# Patient Record
Sex: Female | Born: 1940 | Race: White | Hispanic: No | Marital: Married | Smoking: Never smoker
Health system: Southern US, Community
[De-identification: ages and names within clinical notes are randomized; demographics above are authoritative.]

## PROBLEM LIST (undated history)

## (undated) DIAGNOSIS — D649 Anemia, unspecified: Secondary | ICD-10-CM

## (undated) DIAGNOSIS — R002 Palpitations: Secondary | ICD-10-CM

## (undated) DIAGNOSIS — E782 Mixed hyperlipidemia: Secondary | ICD-10-CM

## (undated) DIAGNOSIS — Z87442 Personal history of urinary calculi: Secondary | ICD-10-CM

## (undated) DIAGNOSIS — I2510 Atherosclerotic heart disease of native coronary artery without angina pectoris: Secondary | ICD-10-CM

## (undated) DIAGNOSIS — I10 Essential (primary) hypertension: Secondary | ICD-10-CM

## (undated) DIAGNOSIS — E119 Type 2 diabetes mellitus without complications: Secondary | ICD-10-CM

## (undated) DIAGNOSIS — J300 Vasomotor rhinitis: Secondary | ICD-10-CM

## (undated) DIAGNOSIS — I739 Peripheral vascular disease, unspecified: Secondary | ICD-10-CM

## (undated) HISTORY — PX: BREAST SURGERY: SHX581

## (undated) HISTORY — DX: Palpitations: R00.2

## (undated) HISTORY — DX: Type 2 diabetes mellitus without complications: E11.9

## (undated) HISTORY — PX: CHOLECYSTECTOMY: SHX55

## (undated) HISTORY — DX: Vasomotor rhinitis: J30.0

## (undated) HISTORY — DX: Essential (primary) hypertension: I10

## (undated) HISTORY — PX: ABDOMINAL HYSTERECTOMY: SHX81

## (undated) HISTORY — DX: Mixed hyperlipidemia: E78.2

## (undated) HISTORY — DX: Anemia, unspecified: D64.9

## (undated) HISTORY — PX: APPENDECTOMY: SHX54

## (undated) HISTORY — DX: Peripheral vascular disease, unspecified: I73.9

## (undated) HISTORY — DX: Atherosclerotic heart disease of native coronary artery without angina pectoris: I25.10

---

## 2004-09-08 HISTORY — PX: CORONARY ARTERY BYPASS GRAFT: SHX141

## 2006-05-04 DIAGNOSIS — Z951 Presence of aortocoronary bypass graft: Secondary | ICD-10-CM | POA: Insufficient documentation

## 2006-05-04 DIAGNOSIS — I119 Hypertensive heart disease without heart failure: Secondary | ICD-10-CM | POA: Insufficient documentation

## 2006-05-04 DIAGNOSIS — F411 Generalized anxiety disorder: Secondary | ICD-10-CM | POA: Insufficient documentation

## 2006-05-04 DIAGNOSIS — E785 Hyperlipidemia, unspecified: Secondary | ICD-10-CM | POA: Insufficient documentation

## 2006-07-08 DIAGNOSIS — I059 Rheumatic mitral valve disease, unspecified: Secondary | ICD-10-CM | POA: Insufficient documentation

## 2009-03-27 ENCOUNTER — Encounter: Payer: Self-pay | Admitting: Cardiology

## 2009-06-22 ENCOUNTER — Encounter: Payer: Self-pay | Admitting: Cardiology

## 2009-06-23 ENCOUNTER — Encounter: Payer: Self-pay | Admitting: Cardiology

## 2009-06-25 ENCOUNTER — Encounter: Payer: Self-pay | Admitting: Cardiology

## 2009-06-25 DIAGNOSIS — E782 Mixed hyperlipidemia: Secondary | ICD-10-CM

## 2009-06-25 DIAGNOSIS — E119 Type 2 diabetes mellitus without complications: Secondary | ICD-10-CM | POA: Insufficient documentation

## 2009-06-25 DIAGNOSIS — I10 Essential (primary) hypertension: Secondary | ICD-10-CM

## 2009-06-25 DIAGNOSIS — R0602 Shortness of breath: Secondary | ICD-10-CM | POA: Insufficient documentation

## 2009-06-25 DIAGNOSIS — R072 Precordial pain: Secondary | ICD-10-CM

## 2009-06-26 ENCOUNTER — Encounter (INDEPENDENT_AMBULATORY_CARE_PROVIDER_SITE_OTHER): Payer: Self-pay | Admitting: *Deleted

## 2009-06-26 ENCOUNTER — Ambulatory Visit: Payer: Self-pay | Admitting: Cardiology

## 2009-06-26 DIAGNOSIS — R002 Palpitations: Secondary | ICD-10-CM | POA: Insufficient documentation

## 2009-06-26 DIAGNOSIS — I2510 Atherosclerotic heart disease of native coronary artery without angina pectoris: Secondary | ICD-10-CM

## 2009-06-26 DIAGNOSIS — R42 Dizziness and giddiness: Secondary | ICD-10-CM

## 2009-07-09 ENCOUNTER — Ambulatory Visit: Payer: Self-pay | Admitting: Cardiology

## 2009-07-09 ENCOUNTER — Encounter: Payer: Self-pay | Admitting: Cardiology

## 2009-07-13 ENCOUNTER — Ambulatory Visit: Payer: Self-pay | Admitting: Cardiology

## 2009-08-10 ENCOUNTER — Ambulatory Visit: Payer: Self-pay | Admitting: Cardiology

## 2012-09-20 ENCOUNTER — Encounter: Payer: Self-pay | Admitting: Cardiology

## 2012-09-20 ENCOUNTER — Other Ambulatory Visit: Payer: Self-pay | Admitting: *Deleted

## 2012-09-20 ENCOUNTER — Ambulatory Visit (INDEPENDENT_AMBULATORY_CARE_PROVIDER_SITE_OTHER): Payer: Medicare Other | Admitting: Cardiology

## 2012-09-20 VITALS — BP 178/80 | HR 54 | Ht 65.5 in | Wt 185.0 lb

## 2012-09-20 DIAGNOSIS — I359 Nonrheumatic aortic valve disorder, unspecified: Secondary | ICD-10-CM

## 2012-09-20 DIAGNOSIS — I351 Nonrheumatic aortic (valve) insufficiency: Secondary | ICD-10-CM

## 2012-09-20 DIAGNOSIS — R0600 Dyspnea, unspecified: Secondary | ICD-10-CM

## 2012-09-20 DIAGNOSIS — R0989 Other specified symptoms and signs involving the circulatory and respiratory systems: Secondary | ICD-10-CM

## 2012-09-20 DIAGNOSIS — R002 Palpitations: Secondary | ICD-10-CM

## 2012-09-20 DIAGNOSIS — I10 Essential (primary) hypertension: Secondary | ICD-10-CM

## 2012-09-20 DIAGNOSIS — I2510 Atherosclerotic heart disease of native coronary artery without angina pectoris: Secondary | ICD-10-CM

## 2012-09-20 DIAGNOSIS — E782 Mixed hyperlipidemia: Secondary | ICD-10-CM

## 2012-09-20 NOTE — Assessment & Plan Note (Signed)
Etiology not certain, heart rate is well controlled today. We have requested most recent ECG from [facility] internal medicine. She has never the cardiac monitor as part of her evaluations, and therefore a 7 day event recorder will be obtained. She reports symptoms usually once or twice a week. We will inform her of the results.

## 2012-09-20 NOTE — Patient Instructions (Addendum)
Your physician recommends that you continue on your current medications as directed. Please refer to the Current Medication list given to you today.  Your physician has recommended that you wear an 7 day cardiac event monitor. Event monitors are medical devices that record the heart's electrical activity. Doctors most often [place] these monitors to diagnose arrhythmias. Arrhythmias are problems with the speed or rhythm of the heartbeat. The monitor is a small, portable device. You can wear one while you do your normal daily activities. This is usually used to diagnose what is causing palpitations/syncope (passing out). [employer] will contact about this monitor. We will call you with results.

## 2012-09-20 NOTE — Assessment & Plan Note (Signed)
History based on limited information, previously PCIs by Dr. Georganna Skeans, subsequently CABG of a single vessel in [place] in 2007. Will request most recent cardiac catheterization results from Dr. Adella Hare at [facility] in 2012. She is not describing angina and, is on reasonable medical therapy. The fact that her symptoms described have not changed at all since revascularization surgery would argue that perhaps another noncardiac etiology should be considered.

## 2012-09-20 NOTE — Progress Notes (Signed)
Clinical Summary Kimberly Flowers is a 72 y.o.female referred back to the office by Dr. Sherril Croon. She is a patient of Dr. Myrtis Ser, last seen in 2010 history outlined below. I do not have complete details of her cardiac history, however she has had reassuring evaluations over time, including both noninvasive and invasive testing. She states that she has a chronic history of intermittent palpitations, describing a rapid heartbeat, not predictable and sometimes very prolonged. She also feels overly fatigued and short of breath when she tries to go up steps or go up inclines, sometimes even by just holding her arms up over her head. Otherwise when she is walking on level ground she feels well, can walk 2 miles at the track at the [facility]. Since she saw Dr. Myrtis Ser, she states she had an evaluation at [facility] with Dr. Adella Hare in 2012, at which time a repeat cardiac catheterization "looked good."  Recent echocardiogram done in [facility] internal medicine on 1/6 reports LVEF 50-55% with mild LVH, trace mitral regurgitation, mildly thickened aortic leaflets with mild aortic regurgitation, mild tricuspid regurgitation, normal pericardium. Recent lab work showed hemoglobin 14.9, platelets 203, BUN 15, creatinine 0.8, potassium 4.5, AST 11, ALT 9, TSH 1.6.  Dobutamine echocardiogram in November 2010 showed noted diagnostic ST segment changes or stress-induced wall motion abnormalities to indicate ischemia. Surface echocardiogram at that time also showed mild aortic regurgitation.  Today we discussed her symptoms in detail. She seems very frustrated by them, was tearful during the interview. She states that Dr. Adella Hare thought that she might have "fibromyalgia." I reviewed her testing results, and have requested the cardiac catheterization report from 2012 as well as her most recent ECG.  She states that she has never worn a cardiac monitor as part of her evaluation.   Allergies  Allergen Reactions  . Sulfonamide Derivatives    REACTION: Unknown    Current Outpatient Prescriptions  Medication Sig Dispense Refill  . aspirin 81 MG tablet Take 81 mg by mouth daily.      Marland Kitchen atorvastatin (LIPITOR) 10 MG tablet Take 10 mg by mouth daily.      . clopidogrel (PLAVIX) 75 MG tablet Take 75 mg by mouth daily.      Marland Kitchen Fe Cbn-Fe Gluc-FA-B12-C-DSS (FERRALET 90 PO) Take 90 mg by mouth 2 (two) times daily.      Marland Kitchen losartan (COZAAR) 50 MG tablet Take 25 mg by mouth 2 (two) times daily. Take 1/2 tab bid      . metformin (FORTAMET) 500 MG (OSM) 24 hr tablet Take 500 mg by mouth 2 (two) times daily with a meal.       . nebivolol (BYSTOLIC) 10 MG tablet Take 10 mg by mouth daily.      Marland Kitchen atenolol (TENORMIN) 25 MG tablet Take 25 mg by mouth 2 (two) times daily.        Past Medical History  Diagnosis Date  . Coronary atherosclerosis of native coronary artery     Previous PCI 2007-2008, Dr. Georganna Skeans  . Mixed hyperlipidemia   . Type 2 diabetes mellitus   . Essential hypertension, benign   . Anemia   . PAD (peripheral artery disease)      Absent right DP  . Vasomotor rhinitis   . Palpitations     Past Surgical History  Procedure Date  . Coronary artery bypass graft 2006    [name], single vessel  . Abdominal hysterectomy     Family History  Problem Relation Age of Onset  .  Cancer Sister     Breast  . CAD      Family history    Social History [name] reports that she has never smoked. She does not have any smokeless tobacco history on file. [name] reports that she does not drink alcohol.  Review of Systems No fevers or chills, no cough, no bleeding problems. No syncope. Does have dependent leg edema, resolved when she gets up in the mornings. Otherwise negative.  Physical Examination Filed Vitals:   09/20/12 1304  BP: 178/80  Pulse: 54   Filed Weights   09/20/12 1304  Weight: 185 lb (83.915 kg)   No acute distress. HEENT: Conjunctiva and lids normal, oropharynx clear. Neck: Supple, no elevated JVP or  carotid bruits, no thyromegaly. Lungs: Clear to auscultation, nonlabored breathing at rest. Cardiac: Regular rate and rhythm, no S3 or significant systolic murmur, no diastolic murmur, no pericardial rub. Abdomen: Soft, nontender, bowel sounds present, no guarding or rebound. Extremities: Trace ankle edema, distal pulses 2+. Skin: Warm and dry. Musculoskeletal: No kyphosis. Neuropsychiatric: Alert and oriented x3, affect grossly appropriate.   Problem List and Plan   Palpitations Etiology not certain, heart rate is well controlled today. We have requested most recent ECG from [facility] internal medicine. She has never the cardiac monitor as part of her evaluations, and therefore a 7 day event recorder will be obtained. She reports symptoms usually once or twice a week. We will inform her of the results.  Coronary atherosclerosis of native coronary artery History based on limited information, previously PCIs by Dr. Georganna Skeans, subsequently CABG of a single vessel in [place] in 2007. Will request most recent cardiac catheterization results from Dr. Adella Hare at [facility] in 2012. She is not describing angina and, is on reasonable medical therapy. The fact that her symptoms described have not changed at all since revascularization surgery would argue that perhaps another noncardiac etiology should be considered.  Essential hypertension, benign Blood-pressure elevated today. I asked about medication doses, and she states that on higher antihypertensive doses, she gets dizzy when she stands up. Keep regular followup with primary care provider.  Mixed hyperlipidemia On statin therapy. Would aim for LDL control under 100.  Aortic regurgitation Mild, stable by recent echocardiogram. This should not be symptom provoking.    Jonelle Sidle, M.D., F.A.C.C.

## 2012-09-20 NOTE — Assessment & Plan Note (Signed)
On statin therapy. Would aim for LDL control under 100.

## 2012-09-20 NOTE — Assessment & Plan Note (Signed)
Blood-pressure elevated today. I asked about medication doses, and she states that on higher antihypertensive doses, she gets dizzy when she stands up. Keep regular followup with primary care provider.

## 2012-09-20 NOTE — Assessment & Plan Note (Signed)
Mild, stable by recent echocardiogram. This should not be symptom provoking.

## 2012-09-20 NOTE — Addendum Note (Signed)
Addended by: Eustace Moore on: 09/20/2012 01:49 PM   Modules accepted: Orders

## 2012-09-22 DIAGNOSIS — R002 Palpitations: Secondary | ICD-10-CM

## 2014-12-05 ENCOUNTER — Encounter: Payer: Self-pay | Admitting: Cardiovascular Disease

## 2014-12-05 ENCOUNTER — Ambulatory Visit (INDEPENDENT_AMBULATORY_CARE_PROVIDER_SITE_OTHER): Payer: Medicare Other | Admitting: Cardiovascular Disease

## 2014-12-05 VITALS — BP 139/82 | HR 52 | Ht 65.5 in | Wt 194.0 lb

## 2014-12-05 DIAGNOSIS — R001 Bradycardia, unspecified: Secondary | ICD-10-CM

## 2014-12-05 DIAGNOSIS — E785 Hyperlipidemia, unspecified: Secondary | ICD-10-CM

## 2014-12-05 DIAGNOSIS — I2510 Atherosclerotic heart disease of native coronary artery without angina pectoris: Secondary | ICD-10-CM

## 2014-12-05 DIAGNOSIS — E119 Type 2 diabetes mellitus without complications: Secondary | ICD-10-CM

## 2014-12-05 DIAGNOSIS — I25768 Atherosclerosis of bypass graft of coronary artery of transplanted heart with other forms of angina pectoris: Secondary | ICD-10-CM | POA: Diagnosis not present

## 2014-12-05 DIAGNOSIS — R0609 Other forms of dyspnea: Secondary | ICD-10-CM

## 2014-12-05 DIAGNOSIS — I10 Essential (primary) hypertension: Secondary | ICD-10-CM | POA: Diagnosis not present

## 2014-12-05 DIAGNOSIS — R5383 Other fatigue: Secondary | ICD-10-CM

## 2014-12-05 NOTE — Progress Notes (Addendum)
Patient ID: Kimberly Flowers, female   DOB: 1941/06/20, 74 y.o.   MRN: [id number]      SUBJECTIVE: The patient is a 74 year old female with a history of coronary artery disease and CABG. As per medical records from 01/06/2011 and she was evaluated by Dr. Metta Clines at [facility], she underwent coronary angiography and reportedly has a known occlusion of the LIMA graft to the LAD. Cardiac catheterization at that time revealed diffuse mild nonobstructive disease and a widely patent stent in the LAD. Her ejection fraction was reportedly 55% with normal regional wall motion. She also has a history of essential hypertension, hyperlipidemia, and type 2 diabetes mellitus.  CABG was performed in 2006 by Dr. Roselie Awkward in [place], [place]. She said that her bypass graft didn't take and she ultimately required 2 stents. She denies a history of asthma, but says she has been short of breath and cannot climb stairs as she becomes fatigued after trying to do so. This started occurring prior to her bypass surgery and never improved. She is tearful when talking about it. She tries to walk 3-4 times per week. Prior to 2006, she said she used to walk 4 miles daily. She sometimes walks 1-2 miles at the [facility] and then has to rest for half a day. She very seldom experiences dizziness and denies syncope altogether. She says her HbA1c was most recently 6% but no recent blood work is available for my review. She is not certain if she has ever had pulmonary function testing. She said she has "low iron counts" but these have improved recently and takes ferrous gluconate 324 mg daily. She also says that if she raises her arms above her head for prolonged periods of time she feels fatigued. There appears to be a history of rheumatoid arthritis amongst sisters but she denies a personal history of this.  She seldom experiences chest pain relieved with one sublingual nitroglycerin. She sleeps with 2-3 pillows for comfort.  ECG performed  in the office today demonstrates sinus bradycardia with T-wave inversions in leads V1 and V2 and a nonspecific T wave abnormality in aVL and V3 through V5. Mild left axis deviation is also present.  Soc: She is married and has grown adult children. She lives in [place]. Her sister, Margie Ege, is also my patient.    Review of Systems: As per "subjective", otherwise negative.  Allergies  Allergen Reactions  . Sulfonamide Derivatives     REACTION: Unknown    Current Outpatient Prescriptions  Medication Sig Dispense Refill  . amLODipine (NORVASC) 5 MG tablet Take 5 mg by mouth daily.    . Ascorbic Acid (VITAMIN C) 1000 MG tablet Take 1,000 mg by mouth 2 (two) times daily.    Marland Kitchen aspirin 81 MG EC tablet Take 81 mg by mouth daily.    Marland Kitchen atorvastatin (LIPITOR) 10 MG tablet Take 10 mg by mouth daily.    . Calcium Carbonate-Vitamin D 600-400 MG-UNIT per tablet Take 1 tablet by mouth 2 (two) times daily.    . Cholecalciferol (VITAMIN D3) 5000 UNITS CAPS Take 1 capsule by mouth daily.    . clopidogrel (PLAVIX) 75 MG tablet Take 75 mg by mouth daily.    Marland Kitchen docusate sodium (COLACE) 100 MG capsule Take 200 mg by mouth daily.    . ferrous gluconate (FERGON) 324 MG tablet Take 324 mg by mouth daily.    . fexofenadine (ALLEGRA) 180 MG tablet Take 180 mg by mouth daily.    Marland Kitchen glucosamine-chondroitin  500-400 MG tablet Take 12 tablets by mouth daily.    Marland Kitchen losartan (COZAAR) 25 MG tablet Take 25 mg by mouth 2 (two) times daily.    . metformin (FORTAMET) 500 MG (OSM) 24 hr tablet Take 500 mg by mouth daily with breakfast.     . nebivolol (BYSTOLIC) 10 MG tablet Take 10 mg by mouth daily.    . nitroGLYCERIN (NITROSTAT) 0.4 MG SL tablet Place 0.4 mg under the tongue every 5 (five) minutes x 3 doses as needed.     . Omega-3 Fatty Acids (FISH OIL) 1200 MG CAPS Take 1 capsule by mouth daily.    Marland Kitchen omeprazole (PRILOSEC) 40 MG capsule Take 40 mg by mouth daily.     No current facility-administered  medications for this visit.    Past Medical History  Diagnosis Date  . Coronary atherosclerosis of native coronary artery     Previous PCI 2007-2008, Dr. Sharyon Cable  . Mixed hyperlipidemia   . Type 2 diabetes mellitus   . Essential hypertension, benign   . Anemia   . PAD (peripheral artery disease)      Absent right DP  . Vasomotor rhinitis   . Palpitations     Past Surgical History  Procedure Laterality Date  . Coronary artery bypass graft  2006    [name], single vessel  . Abdominal hysterectomy      History   Social History  . Marital Status: Married    Spouse Name: N/A  . Number of Children: N/A  . Years of Education: N/A   Occupational History  . Not on file.   Social History Main Topics  . Smoking status: Never Smoker   . Smokeless tobacco: Never Used  . Alcohol Use: No  . Drug Use: No  . Sexual Activity: Not on file   Other Topics Concern  . Not on file   Social History Narrative     Filed Vitals:   12/05/14 0926  BP: 139/82  Pulse: 52  Height: 5' 5.5" (1.664 m)  Weight: 194 lb (87.998 kg)    PHYSICAL EXAM General: NAD HEENT: Normal. Neck: No JVD, no thyromegaly. Lungs: Clear to auscultation bilaterally with normal respiratory effort. CV: Nondisplaced PMI.  Regular rate and rhythm, normal S1/S2, no S3/S4, no murmur. No pretibial or periankle edema.  No carotid bruit.  Normal pedal pulses.  Abdomen: Soft, nontender, no hepatosplenomegaly, no distention.  Neurologic: Alert and oriented x 3.  Psych: Normal affect. Skin: Normal. Musculoskeletal: Normal range of motion, no gross deformities. Extremities: No clubbing or cyanosis.   ECG: Most recent ECG reviewed.      ASSESSMENT AND PLAN: 1. CAD with CABG and occluded LIMA graft to LAD: Stable ischemic heart disease. Will continue ASA, Lipitor, Plavix, and Bystolic. No indication for noninvasive testing at this time. 2. Essential HTN: She said these have only recently been under better  control. Will continue losartan 25 mg twice daily and Bystolic 10 mg daily, along with amlodipine 5 mg daily. 3. Hyperlipidemia: Will obtain copy of lipid panel from PCP. Continue low-intensity statin therapy for now with Lipitor 10 mg. 4. Type 2 diabetes: Appears to be well controlled with metformin 500 mg daily. No changes. 5. Exertional dyspnea while climbing stairs: Symptoms did not appear to improve after bypass surgery and stenting. Left ventricular systolic function was reportedly normal in 2012. It is possible that previously uncontrolled essential hypertension may have led to increased left ventricular end-diastolic filling pressures leading to exertional dyspnea, but I  cannot be certain. Her history of low iron counts may also have contributed to this. I will obtain records from her primary care provider with respect to iron panels and CBC. 6. Fatigue with arm raising: No personal history of autoimmune disease, but there appears to be a family history of this. I will see if an erythrocyte sedimentation rate has ever been checked. 7. Bradycardia: I wonder if this has led to her symptoms of fatigue. However, her symptoms may have predated the institution of this. I will monitor for now. If this is a culprit, I would reduce the dose of nebivolol and increase losartan or amlodipine for BP control.  Dispo: f/u 1 year.  Time spent: 40 minutes, of which greater than 50% was spent reviewing symptoms, relevant studies, and discussing management plan with the patient.   Kate Sable, M.D., F.A.C.C.

## 2014-12-05 NOTE — Patient Instructions (Signed)
Continue all current medications. Your physician wants you to follow up in:  1 year.  You will receive a reminder letter in the mail one-two months in advance.  If you don't receive a letter, please call our office to schedule the follow up appointment   

## 2014-12-11 ENCOUNTER — Encounter: Payer: Self-pay | Admitting: *Deleted

## 2015-08-16 ENCOUNTER — Encounter (HOSPITAL_COMMUNITY): Payer: Self-pay | Admitting: *Deleted

## 2015-08-16 ENCOUNTER — Inpatient Hospital Stay (HOSPITAL_COMMUNITY)
Admission: AD | Admit: 2015-08-16 | Discharge: 2015-08-18 | DRG: 287 | Disposition: A | Discharge status: Home / Self Care | Payer: Medicare Other | Source: Other Acute Inpatient Hospital | Attending: Cardiovascular Disease | Admitting: Cardiovascular Disease

## 2015-08-16 DIAGNOSIS — E119 Type 2 diabetes mellitus without complications: Secondary | ICD-10-CM | POA: Diagnosis present

## 2015-08-16 DIAGNOSIS — I200 Unstable angina: Secondary | ICD-10-CM

## 2015-08-16 DIAGNOSIS — E782 Mixed hyperlipidemia: Secondary | ICD-10-CM | POA: Diagnosis present

## 2015-08-16 DIAGNOSIS — Z8249 Family history of ischemic heart disease and other diseases of the circulatory system: Secondary | ICD-10-CM | POA: Diagnosis not present

## 2015-08-16 DIAGNOSIS — I358 Other nonrheumatic aortic valve disorders: Secondary | ICD-10-CM | POA: Diagnosis present

## 2015-08-16 DIAGNOSIS — Z7984 Long term (current) use of oral hypoglycemic drugs: Secondary | ICD-10-CM

## 2015-08-16 DIAGNOSIS — I739 Peripheral vascular disease, unspecified: Secondary | ICD-10-CM | POA: Diagnosis present

## 2015-08-16 DIAGNOSIS — I10 Essential (primary) hypertension: Secondary | ICD-10-CM | POA: Diagnosis present

## 2015-08-16 DIAGNOSIS — R079 Chest pain, unspecified: Secondary | ICD-10-CM | POA: Diagnosis not present

## 2015-08-16 DIAGNOSIS — I25119 Atherosclerotic heart disease of native coronary artery with unspecified angina pectoris: Secondary | ICD-10-CM | POA: Diagnosis not present

## 2015-08-16 DIAGNOSIS — Z951 Presence of aortocoronary bypass graft: Secondary | ICD-10-CM

## 2015-08-16 DIAGNOSIS — Z9861 Coronary angioplasty status: Secondary | ICD-10-CM | POA: Diagnosis not present

## 2015-08-16 DIAGNOSIS — Z7982 Long term (current) use of aspirin: Secondary | ICD-10-CM

## 2015-08-16 DIAGNOSIS — I25110 Atherosclerotic heart disease of native coronary artery with unstable angina pectoris: Principal | ICD-10-CM | POA: Diagnosis present

## 2015-08-16 DIAGNOSIS — Z809 Family history of malignant neoplasm, unspecified: Secondary | ICD-10-CM | POA: Diagnosis not present

## 2015-08-16 DIAGNOSIS — K219 Gastro-esophageal reflux disease without esophagitis: Secondary | ICD-10-CM | POA: Diagnosis present

## 2015-08-16 DIAGNOSIS — I2510 Atherosclerotic heart disease of native coronary artery without angina pectoris: Secondary | ICD-10-CM | POA: Diagnosis present

## 2015-08-16 MED ORDER — LORATADINE 10 MG PO TABS
10.0000 mg | ORAL_TABLET | Freq: Every day | ORAL | Status: DC
  Administered 2015-08-17 – 2015-08-18 (×2): 10 mg via ORAL
  Filled 2015-08-16 (×2): qty 1

## 2015-08-16 MED ORDER — HEPARIN (PORCINE) IN NACL 100-0.45 UNIT/ML-% IJ SOLN: 1200.0000 [IU]/h | INTRAMUSCULAR | Status: DC

## 2015-08-16 MED ORDER — ACETAMINOPHEN 325 MG PO TABS
650.0000 mg | ORAL_TABLET | ORAL | Status: DC | PRN
  Administered 2015-08-16 – 2015-08-18 (×2): 650 mg via ORAL
  Filled 2015-08-16 (×2): qty 2

## 2015-08-16 MED ORDER — ONDANSETRON HCL 4 MG/2ML IJ SOLN: 4.0000 mg | Freq: Four times a day (QID) | INTRAMUSCULAR | Status: DC | PRN

## 2015-08-16 MED ORDER — CLOPIDOGREL BISULFATE 75 MG PO TABS
75.0000 mg | ORAL_TABLET | Freq: Every day | ORAL | Status: DC
  Administered 2015-08-17 – 2015-08-18 (×2): 75 mg via ORAL
  Filled 2015-08-16 (×2): qty 1

## 2015-08-16 MED ORDER — FERROUS GLUCONATE 324 (38 FE) MG PO TABS
324.0000 mg | ORAL_TABLET | Freq: Every day | ORAL | Status: DC
  Administered 2015-08-16 – 2015-08-17 (×2): 324 mg via ORAL
  Filled 2015-08-16 (×4): qty 1

## 2015-08-16 MED ORDER — ASPIRIN EC 81 MG PO TBEC
81.0000 mg | DELAYED_RELEASE_TABLET | Freq: Every day | ORAL | Status: DC
  Administered 2015-08-17 – 2015-08-18 (×2): 81 mg via ORAL
  Filled 2015-08-16 (×2): qty 1

## 2015-08-16 MED ORDER — OMEGA-3-ACID ETHYL ESTERS 1 G PO CAPS
1.0000 g | ORAL_CAPSULE | Freq: Two times a day (BID) | ORAL | Status: DC
  Administered 2015-08-16 – 2015-08-18 (×4): 1 g via ORAL
  Filled 2015-08-16 (×4): qty 1

## 2015-08-16 MED ORDER — CALCIUM CARBONATE-VITAMIN D 500-200 MG-UNIT PO TABS
1.0000 | ORAL_TABLET | Freq: Two times a day (BID) | ORAL | Status: DC
  Administered 2015-08-16 – 2015-08-18 (×4): 1 via ORAL
  Filled 2015-08-16 (×4): qty 1

## 2015-08-16 MED ORDER — ATORVASTATIN CALCIUM 80 MG PO TABS
80.0000 mg | ORAL_TABLET | Freq: Every day | ORAL | Status: DC
  Administered 2015-08-16 – 2015-08-17 (×2): 80 mg via ORAL
  Filled 2015-08-16 (×2): qty 1

## 2015-08-16 MED ORDER — NITROGLYCERIN 0.4 MG SL SUBL: 0.4000 mg | SUBLINGUAL_TABLET | SUBLINGUAL | Status: DC | PRN

## 2015-08-16 MED ORDER — VITAMIN D3 25 MCG (1000 UNIT) PO TABS
5000.0000 [IU] | ORAL_TABLET | Freq: Every day | ORAL | Status: DC
  Administered 2015-08-17 – 2015-08-18 (×2): 5000 [IU] via ORAL
  Filled 2015-08-16 (×4): qty 5

## 2015-08-16 MED ORDER — AMLODIPINE BESYLATE 5 MG PO TABS
5.0000 mg | ORAL_TABLET | Freq: Every day | ORAL | Status: DC
  Administered 2015-08-16 – 2015-08-17 (×2): 5 mg via ORAL
  Filled 2015-08-16 (×2): qty 1

## 2015-08-16 MED ORDER — LOSARTAN POTASSIUM 25 MG PO TABS
25.0000 mg | ORAL_TABLET | Freq: Two times a day (BID) | ORAL | Status: DC
  Administered 2015-08-16 – 2015-08-18 (×4): 25 mg via ORAL
  Filled 2015-08-16 (×4): qty 1

## 2015-08-16 MED ORDER — PANTOPRAZOLE SODIUM 40 MG PO TBEC
40.0000 mg | DELAYED_RELEASE_TABLET | Freq: Every day | ORAL | Status: DC
Start: 2015-08-17 — End: 2015-08-18
  Administered 2015-08-17 – 2015-08-18 (×2): 40 mg via ORAL
  Filled 2015-08-16 (×2): qty 1

## 2015-08-16 MED ORDER — SUCRALFATE 1 G PO TABS
0.5000 g | ORAL_TABLET | Freq: Three times a day (TID) | ORAL | Status: DC
  Administered 2015-08-16 – 2015-08-18 (×4): 0.5 g via ORAL
  Filled 2015-08-16 (×4): qty 1

## 2015-08-16 MED ORDER — METOPROLOL TARTRATE 12.5 MG HALF TABLET
12.5000 mg | ORAL_TABLET | Freq: Two times a day (BID) | ORAL | Status: DC
  Administered 2015-08-16 – 2015-08-18 (×4): 12.5 mg via ORAL
  Filled 2015-08-16 (×4): qty 1

## 2015-08-16 MED ORDER — VITAMIN C 500 MG PO TABS
1000.0000 mg | ORAL_TABLET | Freq: Two times a day (BID) | ORAL | Status: DC
  Administered 2015-08-16 – 2015-08-18 (×4): 1000 mg via ORAL
  Filled 2015-08-16 (×4): qty 2

## 2015-08-16 MED ORDER — DOCUSATE SODIUM 100 MG PO CAPS: 100.0000 mg | ORAL_CAPSULE | Freq: Every day | ORAL | Status: DC | PRN

## 2015-08-16 NOTE — H&P (Addendum)
Kimberly Flowers is an 74 y.o. female.    Chief Complaint: chest pain Primary Cardiologist: Dr. McDowell/Koneswaran HPI: Kimberly Flowers is a 74 yo woman with PMH of T2DM, hypertension, dyslipidemia, CAD s/p CABG 2006 by Dr. Roselie Awkward in [place], [place] with some records from 12/2010 discussing LHC by Dr. Metta Clines at [facility] performing coronary angiography with known occluded LIMA to LAD and diffuse mild nonobstructive CAD remainder of LAD, EF of 55%. She was seen by Dr. Bronson Ing March 2016 and previously by dr. Domenic Polite and Dr. Ron Parker. She worked in a Clinical cytogeneticist for 17 years and is accompanied by her husband, sister and brother tonight. She went to see her physician this AM at [facility] and then referred to have mild swelling of her left leg evaluated for DVT (negative) when she developed chest pain a little after Noon today. She characterized the pain as sharp, substernal with radiation to her neck. She took asa and NTG spray with mild improvement. She was moved to the ER and pain gradually went away. Labs unrevealing, heparin bolus and gtt started and transfer initiated to [facility].   She typically has stable angina with CP ~ 0-3x a week that resolves with NTG. However, over the past 4-6 weeks she's had CP more frequently probably 4x a week. She typically walks 2 miles several times a week without difficult but last week she was very fatigued after 0.5 mile. Currently she denies infectious symptoms, no diarrhea/fever/chills. No syncope, no PND/orthopnea. No significant weight loss or gain. She is tolerant of her medications - takes asa/plavix for years. She does have frequent EGD/Colonoscopy for possible precancerous lesion noted and follow-up surveillance. She says she has no dark stools but does test hemacullt positive.   Past Medical History  Diagnosis Date  . Coronary atherosclerosis of native coronary artery     Previous PCI 2007-2008, Dr. Sharyon Cable  . Mixed hyperlipidemia   . Type 2 diabetes mellitus  ([place])   . Essential hypertension, benign   . Anemia   . PAD (peripheral artery disease) (HCC)      Absent right DP  . Vasomotor rhinitis   . Palpitations     Past Surgical History  Procedure Laterality Date  . Coronary artery bypass graft  2006    [name], single vessel  . Abdominal hysterectomy      Family History  Problem Relation Age of Onset  . Cancer Sister     Breast  . CAD      Family history   Social History:  reports that she has never smoked. She has never used smokeless tobacco. She reports that she does not drink alcohol or use illicit drugs.  Allergies:  Allergies  Allergen Reactions  . Sulfonamide Derivatives     REACTION: Unknown    Medications Prior to Admission  Medication Sig Dispense Refill  . amLODipine (NORVASC) 5 MG tablet Take 5 mg by mouth at bedtime.     . Ascorbic Acid (VITAMIN C) 1000 MG tablet Take 1,000 mg by mouth 2 (two) times daily.    Marland Kitchen aspirin 81 MG EC tablet Take 81 mg by mouth daily.    Marland Kitchen atorvastatin (LIPITOR) 10 MG tablet Take 10 mg by mouth daily at 6 PM.     . Calcium Carbonate-Vitamin D 600-400 MG-UNIT per tablet Take 1 tablet by mouth 2 (two) times daily.    . Cholecalciferol (VITAMIN D3) 5000 UNITS CAPS Take 5,000 Units by mouth daily.     . clopidogrel (PLAVIX)  75 MG tablet Take 75 mg by mouth daily.    Marland Kitchen docusate sodium (COLACE) 100 MG capsule Take 100 mg by mouth daily as needed for mild constipation or moderate constipation.     . ferrous gluconate (FERGON) 324 MG tablet Take 324 mg by mouth at bedtime.     . fexofenadine (ALLEGRA) 180 MG tablet Take 180 mg by mouth daily.    Marland Kitchen glucosamine-chondroitin 500-400 MG tablet Take 1 tablet by mouth 2 (two) times daily.     Marland Kitchen losartan (COZAAR) 25 MG tablet Take 25 mg by mouth 2 (two) times daily.    . metformin (FORTAMET) 500 MG (OSM) 24 hr tablet Take 500 mg by mouth at bedtime.     . nebivolol (BYSTOLIC) 10 MG tablet Take 10 mg by mouth daily.    . nitroGLYCERIN (NITROLINGUAL)  0.4 MG/SPRAY spray Place 1 spray under the tongue every 5 (five) minutes x 3 doses as needed for chest pain.    . Omega-3 Fatty Acids (FISH OIL) 1200 MG CAPS Take 1,200 mg by mouth at bedtime.     Marland Kitchen omeprazole (PRILOSEC) 40 MG capsule Take 40 mg by mouth daily.    . sucralfate (CARAFATE) 1 G tablet Take 0.5 g by mouth 4 (four) times daily -  with meals and at bedtime.      No results found for this or any previous visit (from the past 48 hour(s)). No results found.  Review of Systems  Constitutional: Negative for fever, chills and malaise/fatigue.  HENT: Negative for ear pain and tinnitus.   Eyes: Negative for double vision, photophobia and pain.  Respiratory: Negative for cough and hemoptysis.        Dyspnea with exertion  Cardiovascular: Positive for chest pain and leg swelling. Negative for orthopnea.       Slight left leg swelling  Gastrointestinal: Negative for vomiting, abdominal pain and diarrhea.  Genitourinary: Negative for dysuria, frequency and hematuria.  Musculoskeletal: Negative for myalgias, back pain and neck pain.  Skin: Negative for rash.  Neurological: Negative for dizziness, tingling, tremors, sensory change and headaches.  Endo/Heme/Allergies: Negative for polydipsia. Does not bruise/bleed easily.  Psychiatric/Behavioral: Negative for depression, suicidal ideas and substance abuse.    Blood pressure 147/74, pulse 56, temperature 98.2 F (36.8 C), temperature source Oral, resp. rate 20, height 5\' 5"  (1.651 m), weight 88.633 kg (195 lb 6.4 oz), SpO2 96 %. Physical Exam  Nursing note and vitals reviewed. Constitutional: She is oriented to person, place, and time. She appears well-developed and well-nourished. No distress.  HENT:  Head: Normocephalic and atraumatic.  Nose: Nose normal.  Mouth/Throat: Oropharynx is clear and moist. No oropharyngeal exudate.  Eyes: Conjunctivae and EOM are normal. Pupils are equal, round, and reactive to light. No scleral icterus.   Neck: Normal range of motion. Neck supple. No JVD present. No tracheal deviation present.  Cardiovascular: Normal rate, regular rhythm, normal heart sounds and intact distal pulses.  Exam reveals no gallop.   No murmur heard. Respiratory: Effort normal and breath sounds normal. No respiratory distress. She has no wheezes. She has no rales.  GI: Soft. Bowel sounds are normal. She exhibits no distension. There is no tenderness. There is no rebound.  Musculoskeletal: Normal range of motion. She exhibits no edema or tenderness.  Neurological: She is alert and oriented to person, place, and time. No cranial nerve deficit. Coordination normal.  Skin: Skin is warm and dry. No rash noted. She is not diaphoretic. No erythema.  Psychiatric:  She has a normal mood and affect. Her behavior is normal. Judgment and thought content normal.   labs reviewed in chart from [facility] Na 140, K 4.9, bun/cr 18/0.79, ast/alt 14/14, albumin 4.2, total protein 7.2, wbc 6, h/h 15/45, plt 216, Trop T, 0.01, INR 1.0 EKG: sinus rhythm, anterior ST changes ~ twi, LVH with repolarization  [place] LLE negative for DVT Assessment/Plan Ms. Bastardo is a 74 yo woman with PMH of HTN, dyslipidemia, T2DM on metformin, CAD s/p CABG '06 here with chest pain. Differential diagnosis is musculoskeletal pain, esophageal spasm, GERD, pericarditis, ACS/NSTEMI among other etiologies. I favor a diagnosis of unstable angina given history of escalating frequency of chest pain, more severe chest pain and long-standing disease. I discussed my thoughts and preference for likely LHC in AM but defer to further monitoring on telemetry, troponin levels. Will also obtain echocardiogram in AM. For now, continue heparin gtt, asa, plavix (on chronically). She remains chest pain free.  Problem List Chest pain/unstable angina CAD s/p previous CABG T2DM on metformin Hypertension Dyslipidemia GERD  - NPO after MN - trend cardiac markers - observation on  telemetry - asa 81 mg, continue home losartan 50 mg, start metoprolol 12.5 mg bid, atorva 80 mg first dose now - hba1c, tsh, lipid panel, BNP - echocardiogram in AM - LHC vs. Stress test in AM likely  - hold metformin for inpatient stay     Adhvik Canady 08/16/2015, 9:37 PM

## 2015-08-16 NOTE — Progress Notes (Signed)
MD on call paged at 1915 and 2008 to make aware that patient has arrived from [facility] ER via EMS. [facility] [employer]

## 2015-08-16 NOTE — Progress Notes (Signed)
ANTICOAGULATION CONSULT NOTE - Initial Consult  Pharmacy Consult for heparin Indication: chest pain/ACS  Allergies  Allergen Reactions  . Sulfonamide Derivatives     REACTION: Unknown    Patient Measurements: Height: 5\' 5"  (165.1 cm) Weight: 195 lb 6.4 oz (88.633 kg) IBW/kg (Calculated) : 57 Heparin Dosing Weight: 76.28 kg   Vital Signs: Temp: 98.2 F (36.8 C) (12/08 2038) Temp Source: Oral (12/08 2038) BP: 147/74 mmHg (12/08 2038) Pulse Rate: 56 (12/08 2038)  Labs: No results for input(s): HGB, HCT, PLT, APTT, LABPROT, INR, HEPARINUNFRC, CREATININE, CKTOTAL, CKMB, TROPONINI in the last 72 hours.  CrCl cannot be calculated (Patient has no serum creatinine result on file.).   Medical History: Past Medical History  Diagnosis Date  . Coronary atherosclerosis of native coronary artery     Previous PCI 2007-2008, Dr. Sharyon Cable  . Mixed hyperlipidemia   . Type 2 diabetes mellitus ([place])   . Essential hypertension, benign   . Anemia   . PAD (peripheral artery disease) (HCC)      Absent right DP  . Vasomotor rhinitis   . Palpitations     Medications:  Prescriptions prior to admission  Medication Sig Dispense Refill Last Dose  . amLODipine (NORVASC) 5 MG tablet Take 5 mg by mouth at bedtime.    08/15/2015 at Unknown time  . Ascorbic Acid (VITAMIN C) 1000 MG tablet Take 1,000 mg by mouth 2 (two) times daily.   08/16/2015 at Unknown time  . aspirin 81 MG EC tablet Take 81 mg by mouth daily.   08/16/2015 at Unknown time  . atorvastatin (LIPITOR) 10 MG tablet Take 10 mg by mouth daily at 6 PM.    08/15/2015 at Unknown time  . Calcium Carbonate-Vitamin D 600-400 MG-UNIT per tablet Take 1 tablet by mouth 2 (two) times daily.   08/16/2015 at Unknown time  . Cholecalciferol (VITAMIN D3) 5000 UNITS CAPS Take 5,000 Units by mouth daily.    08/16/2015 at Unknown time  . clopidogrel (PLAVIX) 75 MG tablet Take 75 mg by mouth daily.   08/16/2015 at 800  . docusate sodium (COLACE) 100 MG capsule  Take 100 mg by mouth daily as needed for mild constipation or moderate constipation.    Past Week at Unknown time  . ferrous gluconate (FERGON) 324 MG tablet Take 324 mg by mouth at bedtime.    08/15/2015 at Unknown time  . fexofenadine (ALLEGRA) 180 MG tablet Take 180 mg by mouth daily.   08/16/2015 at Unknown time  . glucosamine-chondroitin 500-400 MG tablet Take 1 tablet by mouth 2 (two) times daily.    08/16/2015 at Unknown time  . losartan (COZAAR) 25 MG tablet Take 25 mg by mouth 2 (two) times daily.   08/16/2015 at Unknown time  . metformin (FORTAMET) 500 MG (OSM) 24 hr tablet Take 500 mg by mouth at bedtime.    08/15/2015 at Unknown time  . nebivolol (BYSTOLIC) 10 MG tablet Take 10 mg by mouth daily.   08/16/2015 at 800  . nitroGLYCERIN (NITROLINGUAL) 0.4 MG/SPRAY spray Place 1 spray under the tongue every 5 (five) minutes x 3 doses as needed for chest pain.   08/16/2015 at Unknown time  . Omega-3 Fatty Acids (FISH OIL) 1200 MG CAPS Take 1,200 mg by mouth at bedtime.    08/15/2015 at Unknown time  . omeprazole (PRILOSEC) 40 MG capsule Take 40 mg by mouth daily.   08/16/2015 at Unknown time  . sucralfate (CARAFATE) 1 G tablet Take 0.5 g by mouth  4 (four) times daily -  with meals and at bedtime.   08/16/2015 at Unknown time    Assessment: 74 yo female admitted to outside hospital with chest pain. She was started on a heparin gtt at a rate of 1440 units/hr. Her SCr and CBC from outside hospital is wnl. Pt was not on any anticoagulants PTA.  Goal of Therapy:  Heparin level 0.3-0.7 units/ml Monitor platelets by anticoagulation protocol: Yes   Plan:  -Current heparin rate is a little high compared to our protocol at [facility], given she is almost at steady state and AM labs are usually drawn early, I will wait to adjust the rate based on the morning heparin level -Monitor s/sx bleeding -Daily HL, CBC   Ranen Doolin, Jake Church 08/16/2015,9:52 PM

## 2015-08-17 ENCOUNTER — Encounter (HOSPITAL_COMMUNITY): Payer: Self-pay | Admitting: Cardiovascular Disease

## 2015-08-17 ENCOUNTER — Encounter (HOSPITAL_COMMUNITY)
Admission: AD | Disposition: A | Discharge status: Home / Self Care | Payer: Medicare Other | Source: Other Acute Inpatient Hospital | Attending: Cardiovascular Disease

## 2015-08-17 ENCOUNTER — Inpatient Hospital Stay (HOSPITAL_COMMUNITY): Payer: Medicare Other

## 2015-08-17 DIAGNOSIS — R079 Chest pain, unspecified: Secondary | ICD-10-CM

## 2015-08-17 DIAGNOSIS — I25119 Atherosclerotic heart disease of native coronary artery with unspecified angina pectoris: Secondary | ICD-10-CM

## 2015-08-17 HISTORY — PX: CARDIAC CATHETERIZATION: SHX172

## 2015-08-17 LAB — TROPONIN I: Troponin I: <0.03 ng/mL (ref <0.031)

## 2015-08-17 LAB — CBC WITH DIFFERENTIAL/PLATELET
BASOS ABS: 0.1 10*3/uL (ref 0.0–0.1)
BASOS PCT: 1 %
EOS PCT: 1 %
Eosinophils Absolute: 0.1 10*3/uL (ref 0.0–0.7)
HEMATOCRIT: 41.3 % (ref 36.0–46.0)
Hemoglobin: 14.3 g/dL (ref 12.0–15.0)
LYMPHS PCT: 33 %
Lymphs Abs: 1.8 10*3/uL (ref 0.7–4.0)
MCH: 31 pg (ref 26.0–34.0)
MCHC: 34.6 g/dL (ref 30.0–36.0)
MCV: 89.6 fL (ref 78.0–100.0)
MONO ABS: 0.3 10*3/uL (ref 0.1–1.0)
MONOS PCT: 6 %
NEUTROS ABS: 3.3 10*3/uL (ref 1.7–7.7)
Neutrophils Relative %: 59 %
PLATELETS: 162 10*3/uL (ref 150–400)
RBC: 4.61 MIL/uL (ref 3.87–5.11)
RDW: 13.3 % (ref 11.5–15.5)
WBC: 5.5 10*3/uL (ref 4.0–10.5)

## 2015-08-17 LAB — LIPID PANEL
CHOL/HDL RATIO: 3.4 ratio
CHOLESTEROL: 136 mg/dL (ref 0–200)
HDL: 40 mg/dL — ABNORMAL LOW (ref >40)
LDL Cholesterol: 74 mg/dL (ref 0–99)
Triglycerides: 111 mg/dL (ref <150)
VLDL: 22 mg/dL (ref 0–40)

## 2015-08-17 LAB — PROTIME-INR
INR: 1.14 (ref 0.00–1.49)
Prothrombin Time: 14.8 seconds (ref 11.6–15.2)

## 2015-08-17 LAB — BASIC METABOLIC PANEL
ANION GAP: 7 (ref 5–15)
BUN: 14 mg/dL (ref 6–20)
CHLORIDE: 106 mmol/L (ref 101–111)
CO2: 26 mmol/L (ref 22–32)
Calcium: 9 mg/dL (ref 8.9–10.3)
Creatinine, Ser: 0.69 mg/dL (ref 0.44–1.00)
GFR calc non Af Amer: >60 mL/min (ref >60)
Glucose, Bld: 105 mg/dL — ABNORMAL HIGH (ref 65–99)
Potassium: 3.8 mmol/L (ref 3.5–5.1)
SODIUM: 139 mmol/L (ref 135–145)

## 2015-08-17 LAB — HEPARIN LEVEL (UNFRACTIONATED)
HEPARIN UNFRACTIONATED: 0.31 [IU]/mL (ref 0.30–0.70)
HEPARIN UNFRACTIONATED: 0.34 [IU]/mL (ref 0.30–0.70)

## 2015-08-17 LAB — BRAIN NATRIURETIC PEPTIDE: B NATRIURETIC PEPTIDE 5: 37.1 pg/mL (ref 0.0–100.0)

## 2015-08-17 LAB — TSH: TSH: 4.048 u[IU]/mL (ref 0.350–4.500)

## 2015-08-17 LAB — MAGNESIUM: MAGNESIUM: 2 mg/dL (ref 1.7–2.4)

## 2015-08-17 SURGERY — LEFT HEART CATH AND CORONARY ANGIOGRAPHY
Anesthesia: LOCAL

## 2015-08-17 MED ORDER — LIDOCAINE HCL (PF) 1 % IJ SOLN
INTRAMUSCULAR | Status: AC
  Filled 2015-08-17: qty 30

## 2015-08-17 MED ORDER — SODIUM CHLORIDE 0.9 % IV SOLN: INTRAVENOUS | Status: AC

## 2015-08-17 MED ORDER — FENTANYL CITRATE (PF) 100 MCG/2ML IJ SOLN
INTRAMUSCULAR | Status: DC | PRN
  Administered 2015-08-17: 25 ug via INTRAVENOUS

## 2015-08-17 MED ORDER — NITROGLYCERIN 1 MG/10 ML FOR IR/CATH LAB
INTRA_ARTERIAL | Status: AC
  Filled 2015-08-17: qty 10

## 2015-08-17 MED ORDER — SODIUM CHLORIDE 0.9 % IV SOLN: 250.0000 mL | INTRAVENOUS | Status: DC | PRN

## 2015-08-17 MED ORDER — VERAPAMIL HCL 2.5 MG/ML IV SOLN
INTRAVENOUS | Status: AC
  Filled 2015-08-17: qty 2

## 2015-08-17 MED ORDER — SODIUM CHLORIDE 0.9 % IJ SOLN
3.0000 mL | Freq: Two times a day (BID) | INTRAMUSCULAR | Status: DC
  Administered 2015-08-17: 3 mL via INTRAVENOUS

## 2015-08-17 MED ORDER — HEPARIN (PORCINE) IN NACL 2-0.9 UNIT/ML-% IJ SOLN
INTRAMUSCULAR | Status: AC
  Filled 2015-08-17: qty 1000

## 2015-08-17 MED ORDER — ASPIRIN 81 MG PO CHEW
81.0000 mg | CHEWABLE_TABLET | ORAL | Status: DC
  Filled 2015-08-17: qty 1

## 2015-08-17 MED ORDER — SODIUM CHLORIDE 0.9 % IV SOLN: INTRAVENOUS | Status: DC

## 2015-08-17 MED ORDER — MIDAZOLAM HCL 2 MG/2ML IJ SOLN
INTRAMUSCULAR | Status: AC
  Filled 2015-08-17: qty 2

## 2015-08-17 MED ORDER — SODIUM CHLORIDE 0.9 % IJ SOLN: 3.0000 mL | INTRAMUSCULAR | Status: DC | PRN

## 2015-08-17 MED ORDER — MIDAZOLAM HCL 2 MG/2ML IJ SOLN
INTRAMUSCULAR | Status: DC | PRN
  Administered 2015-08-17: 2 mg via INTRAVENOUS

## 2015-08-17 MED ORDER — ISOSORBIDE MONONITRATE ER 30 MG PO TB24
15.0000 mg | ORAL_TABLET | Freq: Every day | ORAL | Status: DC
  Administered 2015-08-17 – 2015-08-18 (×2): 15 mg via ORAL
  Filled 2015-08-17 (×2): qty 1

## 2015-08-17 MED ORDER — FENTANYL CITRATE (PF) 100 MCG/2ML IJ SOLN
INTRAMUSCULAR | Status: AC
  Filled 2015-08-17: qty 2

## 2015-08-17 MED ORDER — SODIUM CHLORIDE 0.9 % IJ SOLN
3.0000 mL | Freq: Two times a day (BID) | INTRAMUSCULAR | Status: DC
Start: 2015-08-17 — End: 2015-08-17

## 2015-08-17 MED ORDER — HEPARIN SODIUM (PORCINE) 1000 UNIT/ML IJ SOLN
INTRAMUSCULAR | Status: AC
  Filled 2015-08-17: qty 1

## 2015-08-17 MED ORDER — HEPARIN SODIUM (PORCINE) 1000 UNIT/ML IJ SOLN
INTRAMUSCULAR | Status: DC | PRN
  Administered 2015-08-17: 4500 [IU] via INTRAVENOUS

## 2015-08-17 MED ORDER — VERAPAMIL HCL 2.5 MG/ML IV SOLN
INTRAVENOUS | Status: DC | PRN
  Administered 2015-08-17: 12:00:00 via INTRA_ARTERIAL

## 2015-08-17 MED FILL — Heparin Sodium (Porcine) 100 Unt/ML in Sodium Chloride 0.45%: INTRAMUSCULAR | Qty: 250 | Status: AC

## 2015-08-17 SURGICAL SUPPLY — 12 items

## 2015-08-17 NOTE — Progress Notes (Signed)
ANTICOAGULATION CONSULT NOTE - Follow Up Consult  Pharmacy Consult for Heparin  Indication: chest pain/ACS  Allergies  Allergen Reactions  . Sulfonamide Derivatives     REACTION: Unknown    Patient Measurements: Height: 5\' 5"  (165.1 cm) Weight: 195 lb 6.4 oz (88.633 kg) IBW/kg (Calculated) : 57  Vital Signs: Temp: 98.2 F (36.8 C) (12/08 2038) Temp Source: Oral (12/08 2038) BP: 147/74 mmHg (12/08 2038) Pulse Rate: 62 (12/08 2233)  Labs:  Recent Labs  08/16/15 2300 08/17/15 0354  HGB  --  14.3  HCT  --  41.3  PLT  --  162  LABPROT  --  14.8  INR  --  1.14  HEPARINUNFRC  --  0.31  TROPONINI <0.03  --     CrCl cannot be calculated (Patient has no serum creatinine result on file.).   Assessment: Tx from outside facility, initial heparin level is therapeutic, heparin rate reduced from outside facility dosing upon arrival   Goal of Therapy:  Heparin level 0.3-0.7 units/ml Monitor platelets by anticoagulation protocol: Yes   Plan:  -Continue heparin at 1200 units/hr -1100 HL -Possible [facility] or stress test   Narda Bonds 08/17/2015,4:51 AM

## 2015-08-17 NOTE — H&P (View-Only) (Signed)
Patient Name:  Kimberly Flowers, DOB: [birth year], MRN: [id number] Primary Doctor: [name]., MD Primary Cardiologist:   Date: 08/17/2015   SUBJECTIVE: The patient is stable this morning. Troponins are negative. There is no diagnostic EKG change. I have reviewed the admission note and spoken with the patient. Her symptoms are definitely consistent with unstable angina with increasing anginal pattern. We will plan to proceed with catheterization today.   Past Medical History  Diagnosis Date  . Coronary atherosclerosis of native coronary artery     Previous PCI 2007-2008, Dr. Sharyon Cable  . Mixed hyperlipidemia   . Type 2 diabetes mellitus ([place])   . Essential hypertension, benign   . Anemia   . PAD (peripheral artery disease) (HCC)      Absent right DP  . Vasomotor rhinitis   . Palpitations    Filed Vitals:   08/16/15 1907 08/16/15 2038 08/16/15 2233 08/17/15 0500  BP: 171/93 147/74  178/60  Pulse: 70 56 62 56  Temp: 98.6 F (37 C) 98.2 F (36.8 C)  98 F (36.7 C)  TempSrc: Oral Oral  Oral  Resp:  20    Height: 5\' 5"  (1.651 m)     Weight: 195 lb 6.4 oz (88.633 kg)   195 lb 1.6 oz (88.497 kg)  SpO2: 92% 96%  96%    Intake/Output Summary (Last 24 hours) at 08/17/15 0812 Last data filed at 08/17/15 0659  Gross per 24 hour  Intake 1434.8 ml  Output   1000 ml  Net  434.8 ml   Filed Weights   08/16/15 1907 08/17/15 0500  Weight: 195 lb 6.4 oz (88.633 kg) 195 lb 1.6 oz (88.497 kg)     LABS: Basic Metabolic Panel:  Recent Labs  08/17/15 0354  NA 139  K 3.8  CL 106  CO2 26  GLUCOSE 105*  BUN 14  CREATININE 0.69  CALCIUM 9.0  MG 2.0   Liver Function Tests: No results for input(s): AST, ALT, ALKPHOS, BILITOT, PROT, ALBUMIN in the last 72 hours. No results for input(s): LIPASE, AMYLASE in the last 72 hours. CBC:  Recent Labs  08/17/15 0354  WBC 5.5  NEUTROABS 3.3  HGB 14.3  HCT 41.3  MCV 89.6  PLT 162   Cardiac Enzymes:  Recent Labs  08/16/15 2300 08/17/15 0354  TROPONINI <0.03 <0.03   BNP: Invalid input(s): POCBNP D-Dimer: No results for input(s): DDIMER in the last 72 hours. Thyroid Function Tests:  Recent Labs  08/17/15 0354  TSH 4.048    RADIOLOGY: No results found.  PHYSICAL EXAM  patient is oriented to person time and place. Affect is normal. Lungs are clear. Respiratory effort is nonlabored. Cardiac exam reveals S1 and S2. There is no peripheral edema.   TELEMETRY: I have reviewed telemetry today August 17, 2015. There is normal sinus rhythm. There is also sinus bradycardia.  ECG: I reviewed current and old EKGs. There is no acute change.  ASSESSMENT AND PLAN:    Unstable angina [facility])     The patient has unstable angina clinically. Carefully considered whether catheterization or exercise testing would be most appropriate. In this situation I feel cardiac cath would be most appropriate. The patient is in agreement. We will proceed with catheterization today.    Mixed hyperlipidemia   Essential hypertension, benign   Coronary atherosclerosis of native coronary artery   T2DM (type 2 diabetes mellitus) (HCC)    Sinus bradycardia    The patient's rate is relatively low.  This will be monitored.  Dola Argyle 08/17/2015 8:12 AM

## 2015-08-17 NOTE — Progress Notes (Signed)
SP LHC today.  Medical MGT.  ASA, plavix, metoprolol 12.5 bid, statin, lovaza.   Will add imdur 15 mg now.  Ambulate and DC home in the morning if she is doing well.   Kortny Lirette, PAC

## 2015-08-17 NOTE — Progress Notes (Signed)
Echocardiogram 2D Echocardiogram has been performed.  Joelene Millin 08/17/2015, 2:21 PM

## 2015-08-17 NOTE — Progress Notes (Signed)
UR Completed Dafney Farler Graves-Bigelow, RN,BSN [phone]  

## 2015-08-17 NOTE — Progress Notes (Signed)
[place] for heparin Indication: chest pain/ACS  Allergies  Allergen Reactions  . Sulfonamide Derivatives     REACTION: Unknown    Patient Measurements: Height: 5\' 5"  (165.1 cm) Weight: 195 lb 1.6 oz (88.497 kg) IBW/kg (Calculated) : 57 Heparin Dosing Weight: 76.28 kg   Vital Signs: Temp: 98 F (36.7 C) (12/09 0500) Temp Source: Oral (12/09 0500) BP: 178/60 mmHg (12/09 0500) Pulse Rate: 56 (12/09 0500)  Labs:  Recent Labs  08/16/15 2300 08/17/15 0354 08/17/15 1005  HGB  --  14.3  --   HCT  --  41.3  --   PLT  --  162  --   LABPROT  --  14.8  --   INR  --  1.14  --   HEPARINUNFRC  --  0.31 0.34  CREATININE  --  0.69  --   TROPONINI <0.03 <0.03 <0.03    Estimated Creatinine Clearance: 69.8 mL/min (by C-G formula based on Cr of 0.69).   Medical History: Past Medical History  Diagnosis Date  . Coronary atherosclerosis of native coronary artery     Previous PCI 2007-2008, Dr. Sharyon Cable  . Mixed hyperlipidemia   . Type 2 diabetes mellitus ([place])   . Essential hypertension, benign   . Anemia   . PAD (peripheral artery disease) (HCC)      Absent right DP  . Vasomotor rhinitis   . Palpitations     Medications:  Prescriptions prior to admission  Medication Sig Dispense Refill Last Dose  . amLODipine (NORVASC) 5 MG tablet Take 5 mg by mouth at bedtime.    08/15/2015 at Unknown time  . Ascorbic Acid (VITAMIN C) 1000 MG tablet Take 1,000 mg by mouth 2 (two) times daily.   08/16/2015 at Unknown time  . aspirin 81 MG EC tablet Take 81 mg by mouth daily.   08/16/2015 at Unknown time  . atorvastatin (LIPITOR) 10 MG tablet Take 10 mg by mouth daily at 6 PM.    08/15/2015 at Unknown time  . Calcium Carbonate-Vitamin D 600-400 MG-UNIT per tablet Take 1 tablet by mouth 2 (two) times daily.   08/16/2015 at Unknown time  . Cholecalciferol (VITAMIN D3) 5000 UNITS CAPS Take 5,000 Units by mouth daily.    08/16/2015 at Unknown time  . clopidogrel  (PLAVIX) 75 MG tablet Take 75 mg by mouth daily.   08/16/2015 at 800  . docusate sodium (COLACE) 100 MG capsule Take 100 mg by mouth daily as needed for mild constipation or moderate constipation.    Past Week at Unknown time  . ferrous gluconate (FERGON) 324 MG tablet Take 324 mg by mouth at bedtime.    08/15/2015 at Unknown time  . fexofenadine (ALLEGRA) 180 MG tablet Take 180 mg by mouth daily.   08/16/2015 at Unknown time  . glucosamine-chondroitin 500-400 MG tablet Take 1 tablet by mouth 2 (two) times daily.    08/16/2015 at Unknown time  . losartan (COZAAR) 25 MG tablet Take 25 mg by mouth 2 (two) times daily.   08/16/2015 at Unknown time  . metformin (FORTAMET) 500 MG (OSM) 24 hr tablet Take 500 mg by mouth at bedtime.    08/15/2015 at Unknown time  . nebivolol (BYSTOLIC) 10 MG tablet Take 10 mg by mouth daily.   08/16/2015 at 800  . nitroGLYCERIN (NITROLINGUAL) 0.4 MG/SPRAY spray Place 1 spray under the tongue every 5 (five) minutes x 3 doses as needed for chest pain.   08/16/2015 at Unknown  time  . Omega-3 Fatty Acids (FISH OIL) 1200 MG CAPS Take 1,200 mg by mouth at bedtime.    08/15/2015 at Unknown time  . omeprazole (PRILOSEC) 40 MG capsule Take 40 mg by mouth daily.   08/16/2015 at Unknown time  . sucralfate (CARAFATE) 1 G tablet Take 0.5 g by mouth 4 (four) times daily -  with meals and at bedtime.   08/16/2015 at Unknown time    Assessment: 74 yo female admitted to outside hospital with chest pain. Heparin started at OSH for ACS/UA. No anticoag pta. HL therapeutic (0.34) x2 on 1200 units/h. CBC wnl. No bleed documented. Cath today.  Goal of Therapy:  Heparin level 0.3-0.7 units/ml Monitor platelets by anticoagulation protocol: Yes   Plan:  -Heparin at 1200 units/hr -Daily HL/CBC -Mon s/sx bleeding -Cath today  Elicia Lamp, PharmD, [facility] Clinical Pharmacist Pager [phone] 08/17/2015 11:09 AM

## 2015-08-17 NOTE — Progress Notes (Addendum)
Patient Name:  Kimberly Flowers, DOB: 1941/02/13, MRN: [id number] Primary Doctor: [name]., MD Primary Cardiologist:   Date: 08/17/2015   SUBJECTIVE: The patient is stable this morning. Troponins are negative. There is no diagnostic EKG change. I have reviewed the admission note and spoken with the patient. Her symptoms are definitely consistent with unstable angina with increasing anginal pattern. We will plan to proceed with catheterization today.   Past Medical History  Diagnosis Date  . Coronary atherosclerosis of native coronary artery     Previous PCI 2007-2008, Dr. Sharyon Cable  . Mixed hyperlipidemia   . Type 2 diabetes mellitus ([place])   . Essential hypertension, benign   . Anemia   . PAD (peripheral artery disease) (HCC)      Absent right DP  . Vasomotor rhinitis   . Palpitations    Filed Vitals:   08/16/15 1907 08/16/15 2038 08/16/15 2233 08/17/15 0500  BP: 171/93 147/74  178/60  Pulse: 70 56 62 56  Temp: 98.6 F (37 C) 98.2 F (36.8 C)  98 F (36.7 C)  TempSrc: Oral Oral  Oral  Resp:  20    Height: 5\' 5"  (1.651 m)     Weight: 195 lb 6.4 oz (88.633 kg)   195 lb 1.6 oz (88.497 kg)  SpO2: 92% 96%  96%    Intake/Output Summary (Last 24 hours) at 08/17/15 0812 Last data filed at 08/17/15 0659  Gross per 24 hour  Intake 1434.8 ml  Output   1000 ml  Net  434.8 ml   Filed Weights   08/16/15 1907 08/17/15 0500  Weight: 195 lb 6.4 oz (88.633 kg) 195 lb 1.6 oz (88.497 kg)     LABS: Basic Metabolic Panel:  Recent Labs  08/17/15 0354  NA 139  K 3.8  CL 106  CO2 26  GLUCOSE 105*  BUN 14  CREATININE 0.69  CALCIUM 9.0  MG 2.0   Liver Function Tests: No results for input(s): AST, ALT, ALKPHOS, BILITOT, PROT, ALBUMIN in the last 72 hours. No results for input(s): LIPASE, AMYLASE in the last 72 hours. CBC:  Recent Labs  08/17/15 0354  WBC 5.5  NEUTROABS 3.3  HGB 14.3  HCT 41.3  MCV 89.6  PLT 162   Cardiac Enzymes:  Recent Labs  08/16/15 2300 08/17/15 0354  TROPONINI <0.03 <0.03   BNP: Invalid input(s): POCBNP D-Dimer: No results for input(s): DDIMER in the last 72 hours. Thyroid Function Tests:  Recent Labs  08/17/15 0354  TSH 4.048    RADIOLOGY: No results found.  PHYSICAL EXAM  patient is oriented to person time and place. Affect is normal. Lungs are clear. Respiratory effort is nonlabored. Cardiac exam reveals S1 and S2. There is no peripheral edema.   TELEMETRY: I have reviewed telemetry today August 17, 2015. There is normal sinus rhythm. There is also sinus bradycardia.  ECG: I reviewed current and old EKGs. There is no acute change.  ASSESSMENT AND PLAN:    Unstable angina [facility])     The patient has unstable angina clinically. Carefully considered whether catheterization or exercise testing would be most appropriate. In this situation I feel cardiac cath would be most appropriate. The patient is in agreement. We will proceed with catheterization today.    Mixed hyperlipidemia   Essential hypertension, benign   Coronary atherosclerosis of native coronary artery   T2DM (type 2 diabetes mellitus) (HCC)    Sinus bradycardia    The patient's rate is relatively low.  This will be monitored.  Dola Argyle 08/17/2015 8:12 AM

## 2015-08-17 NOTE — Plan of Care (Signed)
Problem: Education: Goal: Knowledge of [facility] Education information/materials will improve Outcome: Progressing See previous notes.

## 2015-08-17 NOTE — Plan of Care (Signed)
Problem: Consults Goal: Chest Pain Patient Education (See Patient Education module for education specifics.)  Outcome: Progressing Education plan initiated. Discussed unit routines, plan of care, current medications and tests/procedures with patient and significant other at bedside.  Sinus bradycardia noted on telemetry, rates as low as 48; no previously documented history of this. Received metoprolol 12.5 mg PO at HS tonight; not on beta-blocker therapy prior to admission. Patient is taking calcium channel-blocker (amlodipine) and ARB (cozaar) at home for hypertension management.  Patient initially presented to [facility] ED subsequent to episodic chest pain and SOB during doppler of lower extremities to rule-out VTE. IV heparin infusing with no signs of active bleeding. Patient awake, alert and oriented x 4 with no active symptoms.  Vital signs have been within tolerance for patient: Filed Vitals:    08/16/15 2038 08/16/15 2233  BP: 147/74    Pulse: 56 62  Temp: 98.2 F (36.8 C)    Resp: 20     Reviewed labs and tests to this point (including those from [facility]).             Troponin I negative             Basic chemistries nominal with exception to glucose impairment (known, on oral antihyperglycemics). Hemoglobin and hematocrit elevated; taking ferrous gluconate at home for history of anemia (currently overcompensated; noted for MD follow-up).             Heme positive stool (see above)             CXR shows no active disease.             EKG:                         Normal axis.             Biphasic P-waves noted; possible pulmonary diease pattern (CXR was negative, however).             Abnormal R-wave progression and generalized T-wave flattening across-the-board; non-specific.             No ST-elevation or depression present.                          R-wave progression changes since 2010; historic EKG printed and posted to shadow chart.  Patient and  family have no questions at this time. Patient is agreeable to cardiac catheterization or stress testing per MD as appropriate. Will consent if ordered.  Continuing to monitor.

## 2015-08-17 NOTE — Interval H&P Note (Signed)
History and Physical Interval Note:  08/17/2015 11:36 AM  [place]  has presented today for cardiac cath with the diagnosis of Unstable angina.  The various methods of treatment have been discussed with the patient and family. After consideration of risks, benefits and other options for treatment, the patient has consented to  Procedure(s): Left Heart Cath and Coronary Angiography (N/A) as a surgical intervention .  The patient's history has been reviewed, patient examined, no change in status, stable for surgery.  I have reviewed the patient's chart and labs.  Questions were answered to the patient's satisfaction.    Cath Lab Visit (complete for each Cath Lab visit)  Clinical Evaluation Leading to the Procedure:   ACS: No.  Non-ACS:    Anginal Classification: CCS III  Anti-ischemic medical therapy: Maximal Therapy (2 or more classes of medications)  Non-Invasive Test Results: No non-invasive testing performed  Prior CABG: Previous CABG        MCALHANY,CHRISTOPHER

## 2015-08-18 DIAGNOSIS — E782 Mixed hyperlipidemia: Secondary | ICD-10-CM

## 2015-08-18 DIAGNOSIS — I200 Unstable angina: Secondary | ICD-10-CM

## 2015-08-18 LAB — CBC
HCT: 39.2 % (ref 36.0–46.0)
HEMOGLOBIN: 13.4 g/dL (ref 12.0–15.0)
MCH: 30.8 pg (ref 26.0–34.0)
MCHC: 34.2 g/dL (ref 30.0–36.0)
MCV: 90.1 fL (ref 78.0–100.0)
PLATELETS: 168 10*3/uL (ref 150–400)
RBC: 4.35 MIL/uL (ref 3.87–5.11)
RDW: 13.3 % (ref 11.5–15.5)
WBC: 5 10*3/uL (ref 4.0–10.5)

## 2015-08-18 LAB — HEMOGLOBIN A1C
Hgb A1c MFr Bld: 6.3 % — ABNORMAL HIGH (ref 4.8–5.6)
MEAN PLASMA GLUCOSE: 134 mg/dL

## 2015-08-18 MED ORDER — ACETAMINOPHEN 325 MG PO TABS: 650.0000 mg | ORAL_TABLET | ORAL | Status: DC | PRN

## 2015-08-18 MED ORDER — ISOSORBIDE MONONITRATE ER 30 MG PO TB24: 15.0000 mg | ORAL_TABLET | Freq: Every day | ORAL | Status: DC

## 2015-08-18 MED ORDER — METFORMIN HCL ER (OSM) 500 MG PO TB24
500.0000 mg | ORAL_TABLET | Freq: Every day | ORAL | Status: DC
Start: 2015-08-20 — End: 2020-07-05

## 2015-08-18 MED ORDER — NEBIVOLOL HCL 5 MG PO TABS: 5.0000 mg | ORAL_TABLET | Freq: Every day | ORAL | Status: DC

## 2015-08-18 NOTE — Discharge Instructions (Signed)
Call [place] [phone] if any bleeding, swelling or drainage at cath site.  May shower, no tub baths for 48 hours for groin sticks. No lifting over 5 pounds for 3 days.  No Driving for 3 days  Heart Healthy diabetic diet.   Do not take Metformin until 12//12/16 , it may interact with cath dye.    You may cut bystolic 10 mg in half and take half a tablet to equal 5 mg daily.  We called in 5 mg tablet for next refill.  You may try support hose for lt leg pain and vein specialist may be beneficial.

## 2015-08-18 NOTE — Discharge Summary (Signed)
Physician Discharge Summary       Patient ID: Kimberly Flowers MRN: [id number] DOB/AGE: Oct 27, 1940 74 y.o.  Admit date: 08/16/2015 Discharge date: 08/18/2015 Primary Cardiologist:Dr. Koneswaran/McDowell   Discharge Diagnoses:  Principal Problem:   Unstable angina [facility]) Active Problems:   Mixed hyperlipidemia   Essential hypertension, benign   Coronary atherosclerosis of native coronary artery   T2DM (type 2 diabetes mellitus) ([place])   Coronary artery disease involving native coronary artery of native heart with angina pectoris [facility])   Discharged Condition: good  Procedures: 08/17/15 cardiac cath by Dr. Shon Millet Course:  74 yo woman with PMH of T2DM, hypertension, dyslipidemia, CAD s/p CABG 2006 by Dr. Roselie Awkward in [place], [place] with some records from 12/2010 discussing LHC by Dr. Metta Clines at [facility] performing coronary angiography with known occluded LIMA to LAD and diffuse mild nonobstructive CAD remainder of LAD, EF of 55%. She was seen by Dr. Bronson Ing March 2016 and previously by dr. Domenic Polite and Dr. Ron Parker. She worked in a Clinical cytogeneticist for 17 years .  She went to see her physician 08/16/15 at [facility] and then referred to have mild swelling of her left leg evaluated for DVT (negative) when she developed chest pain a little after Noon of the same day. She characterized the pain as sharp, substernal with radiation to her neck. She took asa and NTG spray with mild improvement. She was moved to the ER and pain gradually went away. Labs unrevealing, heparin bolus and gtt started and transfer initiated to [facility].   She typically has stable angina with CP ~ 0-3x a week that resolves with NTG. However, over the past 4-6 weeks she's had CP more frequently probably 4x a week. She typically walks 2 miles several times a week without difficult but last week she was very fatigued after 0.5 mile. Currently she denies infectious symptoms, no diarrhea/fever/chills. No syncope, no  PND/orthopnea. No significant weight loss or gain. She is tolerant of her medications - takes asa/plavix for years.  Admitted to [facility] and her troponins have been negative, She underwent cardiac cath and found to have stable disease, medical therapy recommended.  Imdur added.    Pt was on bystolic at home so will continue bystolic though at lower dose.     Today pt still with some Lt leg pain, swelling improved, on venous doppler neg. DVT ? Varicosities. Recommend compression hose and follow up with vein specialist  No further chest pain.  HR in the 50s.   Will arrange outpt appt with Dr. Bronson Ing or Domenic Polite. .     Consults: None  Significant Diagnostic Studies:  BMP Latest Ref Rng 08/17/2015  Glucose 65 - 99 mg/dL 105(H)  BUN 6 - 20 mg/dL 14  Creatinine 0.44 - 1.00 mg/dL 0.69  Sodium 135 - 145 mmol/L 139  Potassium 3.5 - 5.1 mmol/L 3.8  Chloride 101 - 111 mmol/L 106  CO2 22 - 32 mmol/L 26  Calcium 8.9 - 10.3 mg/dL 9.0   CBC Latest Ref Rng 08/18/2015 08/17/2015  WBC 4.0 - 10.5 K/uL 5.0 5.5  Hemoglobin 12.0 - 15.0 g/dL 13.4 14.3  Hematocrit 36.0 - 46.0 % 39.2 41.3  Platelets 150 - 400 K/uL 168 162    TROPONIN <0.03 X 3   Lipid Panel     Component Value Date/Time   CHOL 136 08/17/2015 0412   TRIG 111 08/17/2015 0412   HDL 40* 08/17/2015 0412   CHOLHDL 3.4 08/17/2015 0412   VLDL 22 08/17/2015  0412   LDLCALC 74 08/17/2015 0412   HGBA1C 6.3  TSH  4.048  Echo: Study Conclusions  - Left ventricle: The cavity size was normal. Wall thickness was normal. Systolic function was normal. The estimated ejection fraction was in the range of 55% to 60%. Wall motion was normal; there were no regional wall motion abnormalities. Doppler parameters are consistent with abnormal left ventricular relaxation (grade 1 diastolic dysfunction). The E/e&' ratio is between 8-15, suggesting indeterminate LV filling pressure. - Aortic valve: Trileaflet. Sclerosis without stenosis. There  was trivial regurgitation. - Mitral valve: Calcified annulus. There was trivial regurgitation. - Atrial septum: No defect or patent foramen ovale was identified. - Tricuspid valve: There was trivial regurgitation. - Pulmonary arteries: PA peak pressure: 15 mm Hg (S). - Inferior vena cava: The vessel was normal in size. The respirophasic diameter changes were in the normal range (>= 50%), consistent with normal central venous pressure.  Impressions:  - LVEF 55-60%, normal wall thickness, normal wall motion, diastolic dysfunction, indeterminate LV filling pressure, aortic valve sclerosis with trivial AI, trivial MR/MAC, trivial TR, normal RVSP.  Cardiac cath:  Prox RCA lesion, 20% stenosed.  Ost Cx to Prox Cx lesion, 20% stenosed.  3rd Diag lesion, 90% stenosed.  Mid LAD to Dist LAD lesion, 20% stenosed. The lesion was previously treated with a stent (unknown type) greater than two years ago.  Ost 3rd Diag to 3rd Diag lesion, 40% stenosed.  Ost 2nd Diag lesion, 60% stenosed.  Prox LAD to Mid LAD lesion, 40% stenosed.  The left ventricular systolic function is normal.  LIMA was not injected .  Origin lesion, 100% stenosed.  1. Single vessel CAD with patent stent mid LAD with mild restenosis. 2. Severe disease in a small caliber diagonal branch. The ostium of this branch is jailed by the LAD stent. The vessel is 1.5 mm and too small for PCI.  3. Mild non-obstructive disease in the RCA and Circumflex artery.  4. Normal LV systolic function 5. Known occlusion of the LIMA graft to the LAD (non injected)  Recommendations: Continue medical management of CAD.    Venous doppler done at [facility] neg for DVT.  Discharge Exam: Blood pressure 130/58, pulse 56, temperature 98.2 F (36.8 C), temperature source Oral, resp. rate 18, height 5\' 5"  (1.651 m), weight 196 lb 12.8 oz (89.268 kg), SpO2 94 %. General:Pleasant affect, NAD Skin:Warm and dry, brisk capillary  refill HEENT:normocephalic, sclera clear, mucus membranes moist Heart:S1S2 RRR without murmur, gallup, rub or click Lungs:clear without rales, rhonchi, or wheezes [id number], non tender, + BS, do not palpate liver spleen or masses Ext:no lower ext edema, 2+ pedal pulses, 2+ radial pulses, rt radial cath site without hematoma.  Neuro:alert and oriented X 3, MAE, follows commands, + facial symmetry  Disposition: Home     Medication List    TAKE these medications        acetaminophen 325 MG tablet  Commonly known as:  TYLENOL  Take 2 tablets (650 mg total) by mouth every 4 (four) hours as needed for headache or mild pain.     amLODipine 5 MG tablet  Commonly known as:  NORVASC  Take 5 mg by mouth at bedtime.     aspirin 81 MG EC tablet  Take 81 mg by mouth daily.     atorvastatin 10 MG tablet  Commonly known as:  LIPITOR  Take 10 mg by mouth daily at 6 PM.     Calcium Carbonate-Vitamin D 600-400 MG-UNIT tablet  Take 1 tablet by mouth 2 (two) times daily.     clopidogrel 75 MG tablet  Commonly known as:  PLAVIX  Take 75 mg by mouth daily.     docusate sodium 100 MG capsule  Commonly known as:  COLACE  Take 100 mg by mouth daily as needed for mild constipation or moderate constipation.     ferrous gluconate 324 MG tablet  Commonly known as:  FERGON  Take 324 mg by mouth at bedtime.     fexofenadine 180 MG tablet  Commonly known as:  ALLEGRA  Take 180 mg by mouth daily.     Fish Oil 1200 MG Caps  Take 1,200 mg by mouth at bedtime.     glucosamine-chondroitin 500-400 MG tablet  Take 1 tablet by mouth 2 (two) times daily.     isosorbide mononitrate 30 MG 24 hr tablet  Commonly known as:  IMDUR  Take 0.5 tablets (15 mg total) by mouth daily.     losartan 25 MG tablet  Commonly known as:  COZAAR  Take 25 mg by mouth 2 (two) times daily.     metformin 500 MG (OSM) 24 hr tablet  Commonly known as:  FORTAMET  Take 1 tablet (500 mg total) by mouth at bedtime.  Start  taking on:  08/20/2015     nebivolol 5 MG tablet  Commonly known as:  BYSTOLIC  Take 1 tablet (5 mg total) by mouth daily.     nitroGLYCERIN 0.4 MG/SPRAY spray  Commonly known as:  NITROLINGUAL  Place 1 spray under the tongue every 5 (five) minutes x 3 doses as needed for chest pain.     omeprazole 40 MG capsule  Commonly known as:  PRILOSEC  Take 40 mg by mouth daily.     sucralfate 1 G tablet  Commonly known as:  CARAFATE  Take 0.5 g by mouth 4 (four) times daily -  with meals and at bedtime.     vitamin C 1000 MG tablet  Take 1,000 mg by mouth 2 (two) times daily.     Vitamin D3 5000 UNITS Caps  Take 5,000 Units by mouth daily.       Follow-up Information    Follow up with Rozann Lesches, MD.   Specialty:  Cardiology   Why:  the office will call with date and time   Contact information:   [place] [phone]        Discharge Instructions: Call [place] [phone] if any bleeding, swelling or drainage at cath site.  May shower, no tub baths for 48 hours for groin sticks. No lifting over 5 pounds for 3 days.  No Driving for 3 days  Heart Healthy diabetic diet.   Do not take Metformin until 12//12/16 , it may interact with cath dye.    You may cut bystolic 10 mg in half and take half a tablet to equal 5 mg daily.  We called in 5 mg tablet for next refill.  Signed: Isaiah Serge Nurse Practitioner-Certified [facility]: [facility] 08/18/2015, 8:20 AM  Time spent on discharge : >  30 minutes.    Attending Note:   The patient was seen and examined.  Agree with assessment and plan as noted above.  Changes made to the above note as needed.  Pt is doing well after cath No significant change in coronary anatomy. Imdur 15 mg  Added. Bystolic was decreased to 5 mg because of low  HR.  Right radial cath site is normal.   Gave advice on care of that.   OK for DC today .  Follow up with Dr. Letta Moynahan  .    Thayer Headings, Brooke Bonito., MD, [facility] 08/18/2015, 8:24 AM [address]  [place] Pager [phone]

## 2015-08-20 MED FILL — Nitroglycerin IV Soln 100 MCG/ML in D5W: INTRA_ARTERIAL | Qty: 10 | Status: AC

## 2015-08-20 MED FILL — Lidocaine HCl Local Preservative Free (PF) Inj 1%: INTRAMUSCULAR | Qty: 30 | Status: AC

## 2015-08-20 MED FILL — Heparin Sodium (Porcine) 2 Unit/ML in Sodium Chloride 0.9%: INTRAMUSCULAR | Qty: 500 | Status: AC

## 2015-08-30 ENCOUNTER — Ambulatory Visit (INDEPENDENT_AMBULATORY_CARE_PROVIDER_SITE_OTHER): Payer: Medicare Other | Admitting: Cardiovascular Disease

## 2015-08-30 ENCOUNTER — Encounter: Payer: Self-pay | Admitting: Cardiovascular Disease

## 2015-08-30 VITALS — BP 130/80 | HR 67 | Ht 65.0 in | Wt 200.0 lb

## 2015-08-30 DIAGNOSIS — I25768 Atherosclerosis of bypass graft of coronary artery of transplanted heart with other forms of angina pectoris: Secondary | ICD-10-CM | POA: Diagnosis not present

## 2015-08-30 DIAGNOSIS — I83892 Varicose veins of left lower extremities with other complications: Secondary | ICD-10-CM | POA: Diagnosis not present

## 2015-08-30 DIAGNOSIS — I10 Essential (primary) hypertension: Secondary | ICD-10-CM

## 2015-08-30 DIAGNOSIS — Z87898 Personal history of other specified conditions: Secondary | ICD-10-CM

## 2015-08-30 DIAGNOSIS — E785 Hyperlipidemia, unspecified: Secondary | ICD-10-CM

## 2015-08-30 DIAGNOSIS — Z9289 Personal history of other medical treatment: Secondary | ICD-10-CM

## 2015-08-30 MED ORDER — NITROGLYCERIN 0.4 MG/SPRAY TL SOLN
1.0000 | Status: DC | PRN
Start: 2015-08-30 — End: 2017-03-04

## 2015-08-30 NOTE — Patient Instructions (Signed)
Your physician recommends that you schedule a follow-up appointment in: 4 MONTHS WITH DR KONESWARAN  Your physician recommends that you continue on your current medications as directed. Please refer to the Current Medication list given to you today.  Thank you for choosing [facility]!!    

## 2015-08-30 NOTE — Progress Notes (Signed)
Patient ID: Kimberly Flowers, female   DOB: 07-16-1941, 74 y.o.   MRN: [id number]      SUBJECTIVE: The patient presents for post hospitalization follow-up. She was recently hospitalized at [facility] for chest pain and underwent coronary angiography which demonstrated the following:   Prox RCA lesion, 20% stenosed.  Ost Cx to Prox Cx lesion, 20% stenosed.  3rd Diag lesion, 90% stenosed.  Mid LAD to Dist LAD lesion, 20% stenosed. The lesion was previously treated with a stent (unknown type) greater than two years ago.  Ost 3rd Diag to 3rd Diag lesion, 40% stenosed.  Ost 2nd Diag lesion, 60% stenosed.  Prox LAD to Mid LAD lesion, 40% stenosed.  The left ventricular systolic function is normal.  LIMA was not injected .  Origin lesion, 100% stenosed.  1. Single vessel CAD with patent stent mid LAD with mild restenosis. 2. Severe disease in a small caliber diagonal branch. The ostium of this branch is jailed by the LAD stent. The vessel is 1.5 mm and too small for PCI.  3. Mild non-obstructive disease in the RCA and Circumflex artery.  4. Normal LV systolic function 5. 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  Imdur was added to medication regimen.  She had some left leg pain and swelling and venous Dopplers were presumably negative for DVT. Compression hose were recommended.  Denies chest pain and shortness of breath.  Review of Systems: As per "subjective", otherwise negative.  Allergies  Allergen Reactions  . Sulfonamide Derivatives     REACTION: Unknown    Current Outpatient Prescriptions  Medication Sig Dispense Refill  . acetaminophen (TYLENOL) 325 MG tablet Take 2 tablets (650 mg total) by mouth every 4 (four) hours as needed for headache or mild  pain.    Marland Kitchen amLODipine (NORVASC) 5 MG tablet Take 5 mg by mouth at bedtime.     . Ascorbic Acid (VITAMIN C) 1000 MG tablet Take 1,000 mg by mouth 2 (two) times daily.    Marland Kitchen aspirin 81 MG EC tablet Take 81 mg by mouth daily.    Marland Kitchen atorvastatin (LIPITOR) 10 MG tablet Take 10 mg by mouth daily at 6 PM.     . Calcium Carbonate-Vitamin D 600-400 MG-UNIT per tablet Take 1 tablet by mouth 2 (two) times daily.    . Cholecalciferol (VITAMIN D3) 5000 UNITS CAPS Take 5,000 Units by mouth daily.     . clopidogrel (PLAVIX) 75 MG tablet Take 75 mg by mouth daily.    Marland Kitchen docusate sodium (COLACE) 100 MG capsule Take 100 mg by mouth daily as needed for mild constipation or moderate constipation.     . ferrous gluconate (FERGON) 324 MG tablet Take 324 mg by mouth at bedtime.     . fexofenadine (ALLEGRA) 180 MG tablet Take 180 mg by mouth daily.    Marland Kitchen glucosamine-chondroitin 500-400 MG tablet Take 1 tablet by mouth 2 (two) times daily.     . isosorbide mononitrate (IMDUR) 30 MG 24 hr tablet Take 0.5 tablets (15 mg total) by mouth daily. 17 tablet 6  . losartan (COZAAR) 25 MG tablet Take 25 mg by mouth 2 (two) times daily.    . metformin (FORTAMET) 500 MG (OSM) 24 hr tablet Take 1 tablet (500 mg total) by mouth at bedtime.    Marland Kitchen  nebivolol (BYSTOLIC) 5 MG tablet Take 1 tablet (5 mg total) by mouth daily. 30 tablet 6  . nitroGLYCERIN (NITROLINGUAL) 0.4 MG/SPRAY spray Place 1 spray under the tongue every 5 (five) minutes x 3 doses as needed for chest pain.    . Omega-3 Fatty Acids (FISH OIL) 1200 MG CAPS Take 1,200 mg by mouth at bedtime.     Marland Kitchen omeprazole (PRILOSEC) 40 MG capsule Take 40 mg by mouth daily.    . sucralfate (CARAFATE) 1 G tablet Take 0.5 g by mouth 3 (three) times daily.      No current facility-administered medications for this visit.    Past Medical History  Diagnosis Date  . Coronary atherosclerosis of native coronary artery     Previous PCI 2007-2008, Dr. Sharyon Cable  . Mixed hyperlipidemia   . Type 2  diabetes mellitus ([place])   . Essential hypertension, benign   . Anemia   . PAD (peripheral artery disease) (HCC)      Absent right DP  . Vasomotor rhinitis   . Palpitations     Past Surgical History  Procedure Laterality Date  . Coronary artery bypass graft  2006    [name], single vessel  . Abdominal hysterectomy    . Cardiac catheterization N/A 08/17/2015    Procedure: Left Heart Cath and Coronary Angiography;  Surgeon: Burnell Blanks, MD;  Location: [place] [facility];  Service: Cardiovascular;  Laterality: N/A;    Social History   Social History  . Marital Status: Married    Spouse Name: N/A  . Number of Children: N/A  . Years of Education: N/A   Occupational History  . Not on file.   Social History Main Topics  . Smoking status: Never Smoker   . Smokeless tobacco: Never Used  . Alcohol Use: No  . Drug Use: No  . Sexual Activity: Not on file   Other Topics Concern  . Not on file   Social History Narrative     Filed Vitals:   08/30/15 1333  BP: 130/80  Pulse: 67  Height: 5\' 5"  (1.651 m)  Weight: 200 lb (90.719 kg)  SpO2: 97%    PHYSICAL EXAM General: NAD HEENT: Normal. Neck: No JVD, no thyromegaly. Lungs: Clear to auscultation bilaterally with normal respiratory effort. CV: Nondisplaced PMI.  Regular rate and rhythm, normal S1/S2, no S3/S4, no murmur. Mild edema of left leg with venous varicosities.  No carotid bruit.  Normal pedal pulses.  Abdomen: Soft, nontender, no distention.  Neurologic: Alert and oriented x 3.  Psych: Normal affect. Skin: Normal. Musculoskeletal: No gross deformities. Extremities: No clubbing or cyanosis.   ECG: Most recent ECG reviewed.      ASSESSMENT AND PLAN: 1. CAD with CABG and occluded LIMA graft to LAD: Stable ischemic heart disease. Will continue ASA, Lipitor, Plavix, Lipitor, Imdur, and Bystolic. Will prescribe nitro spray per her request.  2. Essential HTN: Controlled. No changes.  3.  Hyperlipidemia: LDL 74, HDL 40, TC 136, TG 111 on 08/17/15. Continue low-intensity statin therapy for now with Lipitor 10 mg.  4. Venous varicosities/left leg swelling: Compression stockings.  Dispo: f/u 4 months.  Kate Sable, M.D., F.A.C.C.

## 2015-09-14 DIAGNOSIS — J320 Chronic maxillary sinusitis: Secondary | ICD-10-CM | POA: Diagnosis not present

## 2015-09-14 DIAGNOSIS — E7800 Pure hypercholesterolemia, unspecified: Secondary | ICD-10-CM | POA: Diagnosis not present

## 2015-09-14 DIAGNOSIS — Z789 Other specified health status: Secondary | ICD-10-CM | POA: Diagnosis not present

## 2015-09-14 DIAGNOSIS — I2510 Atherosclerotic heart disease of native coronary artery without angina pectoris: Secondary | ICD-10-CM | POA: Diagnosis not present

## 2015-09-28 DIAGNOSIS — D123 Benign neoplasm of transverse colon: Secondary | ICD-10-CM | POA: Diagnosis not present

## 2015-09-28 DIAGNOSIS — D131 Benign neoplasm of stomach: Secondary | ICD-10-CM | POA: Diagnosis not present

## 2015-10-15 DIAGNOSIS — I10 Essential (primary) hypertension: Secondary | ICD-10-CM | POA: Diagnosis not present

## 2015-10-15 DIAGNOSIS — J069 Acute upper respiratory infection, unspecified: Secondary | ICD-10-CM | POA: Diagnosis not present

## 2015-10-31 DIAGNOSIS — Z961 Presence of intraocular lens: Secondary | ICD-10-CM | POA: Diagnosis not present

## 2015-11-20 DIAGNOSIS — Z299 Encounter for prophylactic measures, unspecified: Secondary | ICD-10-CM | POA: Diagnosis not present

## 2015-11-20 DIAGNOSIS — E1165 Type 2 diabetes mellitus with hyperglycemia: Secondary | ICD-10-CM | POA: Diagnosis not present

## 2015-11-20 DIAGNOSIS — I10 Essential (primary) hypertension: Secondary | ICD-10-CM | POA: Diagnosis not present

## 2015-12-19 ENCOUNTER — Ambulatory Visit (INDEPENDENT_AMBULATORY_CARE_PROVIDER_SITE_OTHER): Payer: Medicare Other | Admitting: Cardiovascular Disease

## 2015-12-19 ENCOUNTER — Encounter: Payer: Self-pay | Admitting: Cardiovascular Disease

## 2015-12-19 VITALS — BP 138/74 | HR 67 | Ht 65.0 in | Wt 199.0 lb

## 2015-12-19 DIAGNOSIS — R0609 Other forms of dyspnea: Secondary | ICD-10-CM

## 2015-12-19 DIAGNOSIS — I25768 Atherosclerosis of bypass graft of coronary artery of transplanted heart with other forms of angina pectoris: Secondary | ICD-10-CM | POA: Diagnosis not present

## 2015-12-19 DIAGNOSIS — I10 Essential (primary) hypertension: Secondary | ICD-10-CM | POA: Diagnosis not present

## 2015-12-19 DIAGNOSIS — I83892 Varicose veins of left lower extremities with other complications: Secondary | ICD-10-CM

## 2015-12-19 DIAGNOSIS — I209 Angina pectoris, unspecified: Secondary | ICD-10-CM

## 2015-12-19 DIAGNOSIS — E785 Hyperlipidemia, unspecified: Secondary | ICD-10-CM

## 2015-12-19 MED ORDER — ISOSORBIDE MONONITRATE ER 30 MG PO TB24: 30.0000 mg | ORAL_TABLET | Freq: Every day | ORAL | Status: DC

## 2015-12-19 NOTE — Progress Notes (Signed)
Patient ID: Kimberly Flowers, female   DOB: [birth year], 75 y.o.   MRN: [id number]      SUBJECTIVE: The patient presents for follow-up of coronary artery disease with prior CABG. She underwent coronary angiography on 08/17/2015 which demonstrated the following:  1. Single vessel CAD with patent stent mid LAD with mild restenosis. 2. Severe disease in a small caliber diagonal branch. The ostium of this branch is jailed by the LAD stent. The vessel is 1.5 mm and too small for PCI.  3. Mild non-obstructive disease in the RCA and Circumflex artery.  4. Normal LV systolic function 5. 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  She gets chest pain once or twice per week alleviated with nitroglycerin spray. She is not certain about her medications and is not entirely sure if she is taking Imdur. Her primary difficulty is walking up stairs or inclines.   Review of Systems: As per "subjective", otherwise negative.  Allergies  Allergen Reactions  . Sulfonamide Derivatives     REACTION: Unknown    Current Outpatient Prescriptions  Medication Sig Dispense Refill  . acetaminophen (TYLENOL) 325 MG tablet Take 2 tablets (650 mg total) by mouth every 4 (four) hours as needed for headache or mild pain.    Marland Kitchen amLODipine (NORVASC) 5 MG tablet Take 5 mg by mouth at bedtime.     . Ascorbic Acid (VITAMIN C) 1000 MG tablet Take 1,000 mg by mouth 2 (two) times daily.    Marland Kitchen aspirin 81 MG EC tablet Take 81 mg by mouth daily.    Marland Kitchen atorvastatin (LIPITOR) 10 MG tablet Take 10 mg by mouth daily at 6 PM.     . Calcium Carbonate-Vitamin D 600-400 MG-UNIT per tablet Take 1 tablet by mouth 2 (two) times daily.    . Cholecalciferol (VITAMIN D3) 5000 UNITS CAPS Take 5,000 Units by mouth daily.     .  clopidogrel (PLAVIX) 75 MG tablet Take 75 mg by mouth daily.    Marland Kitchen docusate sodium (COLACE) 100 MG capsule Take 100 mg by mouth daily as needed for mild constipation or moderate constipation.     . Esomeprazole Magnesium (NEXIUM PO) Take by mouth.    . ferrous gluconate (FERGON) 324 MG tablet Take 324 mg by mouth at bedtime.     . fexofenadine (ALLEGRA) 180 MG tablet Take 180 mg by mouth daily.    Marland Kitchen glucosamine-chondroitin 500-400 MG tablet Take 1 tablet by mouth 2 (two) times daily.     . isosorbide mononitrate (IMDUR) 30 MG 24 hr tablet Take 0.5 tablets (15 mg total) by mouth daily. 17 tablet 6  . losartan (COZAAR) 25 MG tablet Take 25 mg by mouth 2 (two) times daily.    . metformin (FORTAMET) 500 MG (OSM) 24 hr tablet Take 1 tablet (500 mg total) by mouth at bedtime.    . nebivolol (BYSTOLIC) 5 MG tablet Take 1 tablet (5 mg total) by mouth daily. 30 tablet 6  . nitroGLYCERIN (NITROLINGUAL) 0.4 MG/SPRAY spray Place 1 spray under the tongue every 5 (five) minutes x 3 doses as needed for chest pain. 12 g 0  . Omega-3 Fatty Acids (FISH OIL) 1200 MG CAPS Take 1,200 mg by mouth at bedtime.     . sucralfate (CARAFATE) 1 G tablet Take 0.5 g by mouth 3 (  three) times daily.      No current facility-administered medications for this visit.    Past Medical History  Diagnosis Date  . Coronary atherosclerosis of native coronary artery     Previous PCI 2007-2008, Dr. Sharyon Cable  . Mixed hyperlipidemia   . Type 2 diabetes mellitus ([place])   . Essential hypertension, benign   . Anemia   . PAD (peripheral artery disease) ([facility])      Absent right DP  . Vasomotor rhinitis   . Palpitations     Past Surgical History  Procedure Laterality Date  . Coronary artery bypass graft  2006    [name], single vessel  . Abdominal hysterectomy    . Cardiac catheterization N/A 08/17/2015    Procedure: Left Heart Cath and Coronary Angiography;  Surgeon: Burnell Blanks, MD;  Location: [facility];  Service:  Cardiovascular;  Laterality: N/A;    Social History   Social History  . Marital Status: Married    Spouse Name: N/A  . Number of Children: N/A  . Years of Education: N/A   Occupational History  . Not on file.   Social History Main Topics  . Smoking status: Never Smoker   . Smokeless tobacco: Never Used  . Alcohol Use: No  . Drug Use: No  . Sexual Activity: Not on file   Other Topics Concern  . Not on file   Social History Narrative     Filed Vitals:   12/19/15 1135  BP: 138/74  Pulse: 67  Height: 5\' 5"  (1.651 m)  Weight: 199 lb (90.266 kg)  SpO2: 98%    PHYSICAL EXAM General: NAD HEENT: Normal. Neck: No JVD, no thyromegaly. Lungs: Clear to auscultation bilaterally with normal respiratory effort. CV: Nondisplaced PMI. Regular rate and rhythm, normal S1/S2, no S3/S4, no murmur. No pretibial edema. Abdomen: Soft, nontender, no distention.  Neurologic: Alert and oriented x 3.  Psych: Normal affect. Skin: Normal. Musculoskeletal: No gross deformities.  ECG: Most recent ECG reviewed.      ASSESSMENT AND PLAN: 1. Chest pain in context of CAD with CABG and occluded LIMA graft to LAD: Stable ischemic heart disease. Will continue ASA, Lipitor, Plavix, Lipitor, and Bystolic. She is not certain if she is taking Imdur 15 mg. If so, I would increase to 30 mg daily. If she is not taking Imdur at all, I would start Ranexa 500 mg twice daily.  2. Essential HTN: Controlled. No changes.  3. Hyperlipidemia: LDL 74, HDL 40, TC 136, TG 111 on 08/17/15. Continue low-intensity statin therapy for now with Lipitor 10 mg.  4. Venous varicosities/left leg swelling: Compression stockings.  Dispo: f/u 6 months.   Kate Sable, M.D., F.A.C.C.

## 2015-12-19 NOTE — Patient Instructions (Signed)
Your physician has recommended you make the following change in your medication:  Increase isosorbide mononitrate to 30 mg daily. Please take the whole tablet instead of half. Continue all other medications the same. Your physician recommends that you schedule a follow-up appointment in: 6 months. You will receive a reminder letter in the mail in about 4 months reminding you to call and schedule your appointment. If you don't receive this letter, please contact our office.

## 2016-01-15 DIAGNOSIS — Z1389 Encounter for screening for other disorder: Secondary | ICD-10-CM | POA: Diagnosis not present

## 2016-01-15 DIAGNOSIS — Z1211 Encounter for screening for malignant neoplasm of colon: Secondary | ICD-10-CM | POA: Diagnosis not present

## 2016-01-15 DIAGNOSIS — Z0000 Encounter for general adult medical examination without abnormal findings: Secondary | ICD-10-CM | POA: Diagnosis not present

## 2016-01-15 DIAGNOSIS — Z7189 Other specified counseling: Secondary | ICD-10-CM | POA: Diagnosis not present

## 2016-01-15 DIAGNOSIS — E1165 Type 2 diabetes mellitus with hyperglycemia: Secondary | ICD-10-CM | POA: Diagnosis not present

## 2016-01-15 DIAGNOSIS — Z6832 Body mass index (BMI) 32.0-32.9, adult: Secondary | ICD-10-CM | POA: Diagnosis not present

## 2016-01-15 DIAGNOSIS — R5383 Other fatigue: Secondary | ICD-10-CM | POA: Diagnosis not present

## 2016-01-15 DIAGNOSIS — Z79899 Other long term (current) drug therapy: Secondary | ICD-10-CM | POA: Diagnosis not present

## 2016-01-15 DIAGNOSIS — D509 Iron deficiency anemia, unspecified: Secondary | ICD-10-CM | POA: Diagnosis not present

## 2016-01-15 DIAGNOSIS — Z299 Encounter for prophylactic measures, unspecified: Secondary | ICD-10-CM | POA: Diagnosis not present

## 2016-02-14 DIAGNOSIS — Z1231 Encounter for screening mammogram for malignant neoplasm of breast: Secondary | ICD-10-CM | POA: Diagnosis not present

## 2016-03-18 DIAGNOSIS — I10 Essential (primary) hypertension: Secondary | ICD-10-CM | POA: Diagnosis not present

## 2016-03-18 DIAGNOSIS — E1165 Type 2 diabetes mellitus with hyperglycemia: Secondary | ICD-10-CM | POA: Diagnosis not present

## 2016-04-29 ENCOUNTER — Other Ambulatory Visit: Payer: Self-pay | Admitting: Cardiovascular Disease

## 2016-06-25 DIAGNOSIS — E1165 Type 2 diabetes mellitus with hyperglycemia: Secondary | ICD-10-CM | POA: Diagnosis not present

## 2016-06-25 DIAGNOSIS — I10 Essential (primary) hypertension: Secondary | ICD-10-CM | POA: Diagnosis not present

## 2016-06-25 DIAGNOSIS — Z299 Encounter for prophylactic measures, unspecified: Secondary | ICD-10-CM | POA: Diagnosis not present

## 2016-06-25 DIAGNOSIS — R195 Other fecal abnormalities: Secondary | ICD-10-CM | POA: Diagnosis not present

## 2016-07-16 DIAGNOSIS — Z85020 Personal history of malignant carcinoid tumor of stomach: Secondary | ICD-10-CM | POA: Diagnosis not present

## 2016-07-16 DIAGNOSIS — K921 Melena: Secondary | ICD-10-CM | POA: Diagnosis not present

## 2016-07-28 DIAGNOSIS — Z885 Allergy status to narcotic agent status: Secondary | ICD-10-CM | POA: Diagnosis not present

## 2016-07-28 DIAGNOSIS — K2900 Acute gastritis without bleeding: Secondary | ICD-10-CM | POA: Diagnosis not present

## 2016-07-28 DIAGNOSIS — Z8719 Personal history of other diseases of the digestive system: Secondary | ICD-10-CM | POA: Diagnosis not present

## 2016-07-28 DIAGNOSIS — Z7902 Long term (current) use of antithrombotics/antiplatelets: Secondary | ICD-10-CM | POA: Diagnosis not present

## 2016-07-28 DIAGNOSIS — Z85020 Personal history of malignant carcinoid tumor of stomach: Secondary | ICD-10-CM | POA: Diagnosis not present

## 2016-07-28 DIAGNOSIS — Z882 Allergy status to sulfonamides status: Secondary | ICD-10-CM | POA: Diagnosis not present

## 2016-07-28 DIAGNOSIS — Z7984 Long term (current) use of oral hypoglycemic drugs: Secondary | ICD-10-CM | POA: Diagnosis not present

## 2016-07-28 DIAGNOSIS — Z951 Presence of aortocoronary bypass graft: Secondary | ICD-10-CM | POA: Diagnosis not present

## 2016-07-28 DIAGNOSIS — K317 Polyp of stomach and duodenum: Secondary | ICD-10-CM | POA: Diagnosis not present

## 2016-07-28 DIAGNOSIS — K2970 Gastritis, unspecified, without bleeding: Secondary | ICD-10-CM | POA: Diagnosis not present

## 2016-07-28 DIAGNOSIS — R195 Other fecal abnormalities: Secondary | ICD-10-CM | POA: Diagnosis not present

## 2016-07-28 DIAGNOSIS — Z79899 Other long term (current) drug therapy: Secondary | ICD-10-CM | POA: Diagnosis not present

## 2016-07-28 DIAGNOSIS — E119 Type 2 diabetes mellitus without complications: Secondary | ICD-10-CM | POA: Diagnosis not present

## 2016-07-28 DIAGNOSIS — I2510 Atherosclerotic heart disease of native coronary artery without angina pectoris: Secondary | ICD-10-CM | POA: Diagnosis not present

## 2016-07-28 DIAGNOSIS — K449 Diaphragmatic hernia without obstruction or gangrene: Secondary | ICD-10-CM | POA: Diagnosis not present

## 2016-08-11 ENCOUNTER — Ambulatory Visit (INDEPENDENT_AMBULATORY_CARE_PROVIDER_SITE_OTHER): Payer: Medicare Other | Admitting: Cardiovascular Disease

## 2016-08-11 ENCOUNTER — Encounter: Payer: Self-pay | Admitting: Cardiovascular Disease

## 2016-08-11 VITALS — BP 153/88 | HR 63 | Ht 65.0 in

## 2016-08-11 DIAGNOSIS — K2900 Acute gastritis without bleeding: Secondary | ICD-10-CM | POA: Diagnosis not present

## 2016-08-11 DIAGNOSIS — I25768 Atherosclerosis of bypass graft of coronary artery of transplanted heart with other forms of angina pectoris: Secondary | ICD-10-CM | POA: Diagnosis not present

## 2016-08-11 DIAGNOSIS — I83892 Varicose veins of left lower extremities with other complications: Secondary | ICD-10-CM | POA: Diagnosis not present

## 2016-08-11 DIAGNOSIS — I10 Essential (primary) hypertension: Secondary | ICD-10-CM | POA: Diagnosis not present

## 2016-08-11 DIAGNOSIS — K449 Diaphragmatic hernia without obstruction or gangrene: Secondary | ICD-10-CM | POA: Diagnosis not present

## 2016-08-11 DIAGNOSIS — I209 Angina pectoris, unspecified: Secondary | ICD-10-CM | POA: Diagnosis not present

## 2016-08-11 DIAGNOSIS — E7800 Pure hypercholesterolemia, unspecified: Secondary | ICD-10-CM | POA: Diagnosis not present

## 2016-08-11 MED ORDER — AMLODIPINE BESYLATE 10 MG PO TABS: 10.0000 mg | ORAL_TABLET | Freq: Every day | ORAL | 3 refills | Status: DC

## 2016-08-11 MED ORDER — ISOSORBIDE MONONITRATE ER 30 MG PO TB24: 30.0000 mg | ORAL_TABLET | Freq: Every day | ORAL | 3 refills | Status: DC

## 2016-08-11 NOTE — Progress Notes (Signed)
SUBJECTIVE: The patient presents for follow-up of coronary artery disease with prior CABG. She underwent coronary angiography on 08/17/2015 which demonstrated the following:  1. Single vessel CAD with patent stent mid LAD with mild restenosis. 2. Severe disease in a small caliber diagonal branch. The ostium of this branch is jailed by the LAD stent. The vessel is 1.5 mm and too small for PCI.  3. Mild non-obstructive disease in the RCA and Circumflex artery.  4. Normal LV systolic function 5. 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  She has chest pain about once every 10 days and has not had to use nitroglycerin spray since before Thanksgiving. Systolic blood pressure readings at other offices have been 140 and 150. She gets short of breath if she does too much strenuous exercise.  She reports HbA1c is 5.9%.   Review of Systems: As per "subjective", otherwise negative.  Allergies  Allergen Reactions  . Sulfonamide Derivatives     REACTION: Unknown    Current Outpatient Prescriptions  Medication Sig Dispense Refill  . acetaminophen (TYLENOL) 325 MG tablet Take 2 tablets (650 mg total) by mouth every 4 (four) hours as needed for headache or mild pain.    Marland Kitchen amLODipine (NORVASC) 5 MG tablet Take 5 mg by mouth at bedtime.     . Ascorbic Acid (VITAMIN C) 1000 MG tablet Take 1,000 mg by mouth 2 (two) times daily.    Marland Kitchen aspirin 81 MG EC tablet Take 81 mg by mouth daily.    Marland Kitchen atorvastatin (LIPITOR) 10 MG tablet Take 10 mg by mouth daily at 6 PM.     . Calcium Carbonate-Vitamin D 600-400 MG-UNIT per tablet Take 1 tablet by mouth 2 (two) times daily.    . Cholecalciferol (VITAMIN D3) 5000 UNITS CAPS Take 5,000 Units by mouth 2 (two) times daily.     . clopidogrel  (PLAVIX) 75 MG tablet Take 75 mg by mouth daily.    Marland Kitchen docusate sodium (COLACE) 100 MG capsule Take 100 mg by mouth daily as needed for mild constipation or moderate constipation.     . Esomeprazole Magnesium (NEXIUM PO) Take by mouth.    . ferrous gluconate (FERGON) 324 MG tablet Take 324 mg by mouth at bedtime.     . fexofenadine (ALLEGRA) 180 MG tablet Take 180 mg by mouth daily.    Marland Kitchen glucosamine-chondroitin 500-400 MG tablet Take 1 tablet by mouth 2 (two) times daily.     . isosorbide mononitrate (IMDUR) 30 MG 24 hr tablet TAKE ONE TABLET BY MOUTH ONCE DAILY 17 tablet 6  . losartan (COZAAR) 25 MG tablet Take 50 mg by mouth daily.     . metformin (FORTAMET) 500 MG (OSM) 24 hr tablet Take 1 tablet (500 mg total) by mouth at bedtime.    . Multiple Vitamin (MULTIVITAMIN) tablet Take 1 tablet by mouth daily.    . nebivolol (BYSTOLIC) 5 MG tablet Take 1 tablet (5 mg total) by mouth daily. 30 tablet 6  . nitroGLYCERIN (NITROLINGUAL) 0.4 MG/SPRAY spray Place 1 spray under the tongue every 5 (five) minutes x 3 doses as needed for chest pain. 12 g 0  . Omega-3 Fatty Acids (FISH OIL) 1200 MG CAPS Take 1,200 mg by mouth at bedtime.      No current facility-administered medications  for this visit.     Past Medical History:  Diagnosis Date  . Anemia   . Coronary atherosclerosis of native coronary artery    Previous PCI 2007-2008, Dr. Sharyon Cable  . Essential hypertension, benign   . Mixed hyperlipidemia   . PAD (peripheral artery disease) ([facility])     Absent right DP  . Palpitations   . Type 2 diabetes mellitus ([place])   . Vasomotor rhinitis     Past Surgical History:  Procedure Laterality Date  . ABDOMINAL HYSTERECTOMY    . CARDIAC CATHETERIZATION N/A 08/17/2015   Procedure: Left Heart Cath and Coronary Angiography;  Surgeon: Burnell Blanks, MD;  Location: [facility];  Service: Cardiovascular;  Laterality: N/A;  . CORONARY ARTERY BYPASS GRAFT  2006   [name], single vessel     Social History   Social History  . Marital status: Married    Spouse name: N/A  . Number of children: N/A  . Years of education: N/A   Occupational History  . Not on file.   Social History Main Topics  . Smoking status: Never Smoker  . Smokeless tobacco: Never Used  . Alcohol use No  . Drug use: No  . Sexual activity: Not on file   Other Topics Concern  . Not on file   Social History Narrative  . No narrative on file     Vitals:   08/11/16 1544  BP: (!) 160/94  Pulse: 64  SpO2: 97%  Height: 5\' 5"  (1.651 m)    PHYSICAL EXAM General: NAD HEENT: Normal. Neck: No JVD, no thyromegaly. Lungs: Clear to auscultation bilaterally with normal respiratory effort. CV: Nondisplaced PMI.  Regular rate and rhythm, normal S1/S2, no S3/S4, no murmur. No pretibial or periankle edema.  No carotid bruit.   Abdomen: Soft, nontender, no distention.  Neurologic: Alert and oriented.  Psych: Normal affect. Skin: Normal. Musculoskeletal: No gross deformities.    ECG: Most recent ECG reviewed.      ASSESSMENT AND PLAN: 1. CAD with CABG and occluded LIMA graft to LAD: Stable ischemic heart disease. Will continue ASA, Lipitor, Plavix, Lipitor, Imdur, and Bystolic. Will increase amlodipine for BP control and antianginal effect.  2. Essential HTN: Elevated. Increase amlodipine to 10 mg.  3. Hyperlipidemia: Continue low-intensity statin therapy for now with Lipitor 10 mg. Obtain copy of lipids.  4. Venous varicosities/left leg swelling: Compression stockings.  Dispo: f/u 6 months.   Kate Sable, M.D., F.A.C.C.

## 2016-08-11 NOTE — Patient Instructions (Addendum)
Medication Instructions:   Increase Norvasc to 10mg  daily.  Continue all other medications.    Labwork: none  Testing/Procedures: none  Follow-Up: Your physician wants you to follow up in: 6 months.  You will receive a reminder letter in the mail one-two months in advance.  If you don't receive a letter, please call our office to schedule the follow up appointment   Any Other Special Instructions Will Be Listed Below (If Applicable).  If you need a refill on your cardiac medications before your next appointment, please call your pharmacy.

## 2016-08-13 ENCOUNTER — Encounter: Payer: Self-pay | Admitting: *Deleted

## 2016-10-08 DIAGNOSIS — R21 Rash and other nonspecific skin eruption: Secondary | ICD-10-CM | POA: Diagnosis not present

## 2016-10-08 DIAGNOSIS — I10 Essential (primary) hypertension: Secondary | ICD-10-CM | POA: Diagnosis not present

## 2016-10-08 DIAGNOSIS — Z299 Encounter for prophylactic measures, unspecified: Secondary | ICD-10-CM | POA: Diagnosis not present

## 2016-10-08 DIAGNOSIS — I2510 Atherosclerotic heart disease of native coronary artery without angina pectoris: Secondary | ICD-10-CM | POA: Diagnosis not present

## 2016-10-08 DIAGNOSIS — L309 Dermatitis, unspecified: Secondary | ICD-10-CM | POA: Diagnosis not present

## 2016-10-08 DIAGNOSIS — E1165 Type 2 diabetes mellitus with hyperglycemia: Secondary | ICD-10-CM | POA: Diagnosis not present

## 2016-11-25 DIAGNOSIS — E785 Hyperlipidemia, unspecified: Secondary | ICD-10-CM | POA: Diagnosis not present

## 2016-11-25 DIAGNOSIS — Z713 Dietary counseling and surveillance: Secondary | ICD-10-CM | POA: Diagnosis not present

## 2016-11-25 DIAGNOSIS — Z789 Other specified health status: Secondary | ICD-10-CM | POA: Diagnosis not present

## 2016-11-25 DIAGNOSIS — Z6831 Body mass index (BMI) 31.0-31.9, adult: Secondary | ICD-10-CM | POA: Diagnosis not present

## 2016-11-25 DIAGNOSIS — I10 Essential (primary) hypertension: Secondary | ICD-10-CM | POA: Diagnosis not present

## 2016-11-25 DIAGNOSIS — E1159 Type 2 diabetes mellitus with other circulatory complications: Secondary | ICD-10-CM | POA: Diagnosis not present

## 2016-11-25 DIAGNOSIS — Z299 Encounter for prophylactic measures, unspecified: Secondary | ICD-10-CM | POA: Diagnosis not present

## 2016-11-25 DIAGNOSIS — J029 Acute pharyngitis, unspecified: Secondary | ICD-10-CM | POA: Diagnosis not present

## 2016-11-25 DIAGNOSIS — E1165 Type 2 diabetes mellitus with hyperglycemia: Secondary | ICD-10-CM | POA: Diagnosis not present

## 2017-01-21 DIAGNOSIS — Z0000 Encounter for general adult medical examination without abnormal findings: Secondary | ICD-10-CM | POA: Diagnosis not present

## 2017-01-21 DIAGNOSIS — I10 Essential (primary) hypertension: Secondary | ICD-10-CM | POA: Diagnosis not present

## 2017-01-21 DIAGNOSIS — R5383 Other fatigue: Secondary | ICD-10-CM | POA: Diagnosis not present

## 2017-01-21 DIAGNOSIS — E785 Hyperlipidemia, unspecified: Secondary | ICD-10-CM | POA: Diagnosis not present

## 2017-01-21 DIAGNOSIS — Z1389 Encounter for screening for other disorder: Secondary | ICD-10-CM | POA: Diagnosis not present

## 2017-01-21 DIAGNOSIS — Z6833 Body mass index (BMI) 33.0-33.9, adult: Secondary | ICD-10-CM | POA: Diagnosis not present

## 2017-01-21 DIAGNOSIS — I2510 Atherosclerotic heart disease of native coronary artery without angina pectoris: Secondary | ICD-10-CM | POA: Diagnosis not present

## 2017-01-21 DIAGNOSIS — Z79899 Other long term (current) drug therapy: Secondary | ICD-10-CM | POA: Diagnosis not present

## 2017-01-21 DIAGNOSIS — E1165 Type 2 diabetes mellitus with hyperglycemia: Secondary | ICD-10-CM | POA: Diagnosis not present

## 2017-01-21 DIAGNOSIS — Z299 Encounter for prophylactic measures, unspecified: Secondary | ICD-10-CM | POA: Diagnosis not present

## 2017-01-21 DIAGNOSIS — Z7189 Other specified counseling: Secondary | ICD-10-CM | POA: Diagnosis not present

## 2017-01-21 DIAGNOSIS — K219 Gastro-esophageal reflux disease without esophagitis: Secondary | ICD-10-CM | POA: Diagnosis not present

## 2017-02-23 DIAGNOSIS — Z1231 Encounter for screening mammogram for malignant neoplasm of breast: Secondary | ICD-10-CM | POA: Diagnosis not present

## 2017-03-04 ENCOUNTER — Encounter: Payer: Self-pay | Admitting: Cardiovascular Disease

## 2017-03-04 ENCOUNTER — Ambulatory Visit (INDEPENDENT_AMBULATORY_CARE_PROVIDER_SITE_OTHER): Payer: Medicare Other | Admitting: Cardiovascular Disease

## 2017-03-04 VITALS — BP 152/84 | HR 63 | Ht 65.0 in | Wt 199.0 lb

## 2017-03-04 DIAGNOSIS — E7800 Pure hypercholesterolemia, unspecified: Secondary | ICD-10-CM

## 2017-03-04 DIAGNOSIS — I25768 Atherosclerosis of bypass graft of coronary artery of transplanted heart with other forms of angina pectoris: Secondary | ICD-10-CM | POA: Diagnosis not present

## 2017-03-04 DIAGNOSIS — I83892 Varicose veins of left lower extremities with other complications: Secondary | ICD-10-CM | POA: Diagnosis not present

## 2017-03-04 DIAGNOSIS — I10 Essential (primary) hypertension: Secondary | ICD-10-CM | POA: Diagnosis not present

## 2017-03-04 DIAGNOSIS — I209 Angina pectoris, unspecified: Secondary | ICD-10-CM | POA: Diagnosis not present

## 2017-03-04 MED ORDER — NITROGLYCERIN 0.4 MG/SPRAY TL SOLN: 1.0000 | 3 refills | Status: DC | PRN

## 2017-03-04 NOTE — Progress Notes (Signed)
SUBJECTIVE: The patient presents for follow-up of coronary artery disease with prior CABG. She underwent coronary angiography on 08/17/2015 which demonstrated the following:  1. Single vessel CAD with patent stent mid LAD with mild restenosis. 2. Severe disease in a small caliber diagonal branch. The ostium of this branch is jailed by the LAD stent. The vessel is 1.5 mm and too small for PCI.  3. Mild non-obstructive disease in the RCA and Circumflex artery.  4. Normal LV systolic function 5. 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  She has occasional chest pain for which she takes nitroglycerin. Chronic exertional dyspnea is no worse than usual.  She recently bought a blood pressure cuff but has not been checking it.  She walks 1 mile at the [facility] 3-4 times per week. She also takes an exercise class for one hour a week.  Social history: Her husband is a Theme park manager.   Review of Systems: As per "subjective", otherwise negative.  Allergies  Allergen Reactions  . Sulfonamide Derivatives     REACTION: Unknown    Current Outpatient Prescriptions  Medication Sig Dispense Refill  . acetaminophen (TYLENOL) 325 MG tablet Take 2 tablets (650 mg total) by mouth every 4 (four) hours as needed for headache or mild pain.    Marland Kitchen amLODipine (NORVASC) 10 MG tablet Take 1 tablet (10 mg total) by mouth daily. 90 tablet 3  . Ascorbic Acid (VITAMIN C) 1000 MG tablet Take 1,000 mg by mouth 2 (two) times daily.    Marland Kitchen aspirin 81 MG EC tablet Take 81 mg by mouth daily.    Marland Kitchen atorvastatin (LIPITOR) 10 MG tablet Take 10 mg by mouth daily at 6 PM.     . Calcium Carbonate-Vitamin D 600-400 MG-UNIT per tablet Take 1 tablet by mouth 2 (two) times daily.    . Cholecalciferol (VITAMIN D3) 5000  UNITS CAPS Take 5,000 Units by mouth 2 (two) times daily.     . clopidogrel (PLAVIX) 75 MG tablet Take 75 mg by mouth daily.    Marland Kitchen docusate sodium (COLACE) 100 MG capsule Take 100 mg by mouth daily as needed for mild constipation or moderate constipation.     . Esomeprazole Magnesium (NEXIUM PO) Take by mouth.    . ferrous gluconate (FERGON) 324 MG tablet Take 324 mg by mouth at bedtime.     . fexofenadine (ALLEGRA) 180 MG tablet Take 180 mg by mouth daily.    Marland Kitchen glucosamine-chondroitin 500-400 MG tablet Take 1 tablet by mouth 2 (two) times daily.     . isosorbide mononitrate (IMDUR) 30 MG 24 hr tablet Take 1 tablet (30 mg total) by mouth daily. 90 tablet 3  . losartan (COZAAR) 25 MG tablet Take 50 mg by mouth daily.     . metformin (FORTAMET) 500 MG (OSM) 24 hr tablet Take 1 tablet (500 mg total) by mouth at bedtime.    . Multiple Vitamin (MULTIVITAMIN) tablet Take 1 tablet by mouth daily.    . nebivolol (BYSTOLIC) 5 MG tablet Take 1 tablet (5 mg total) by mouth daily. 30 tablet 6  . nitroGLYCERIN (NITROLINGUAL) 0.4 MG/SPRAY spray Place 1 spray under the tongue every 5 (five) minutes x 3 doses as needed for chest pain. 4.9 g 3  . Omega-3 Fatty Acids (FISH OIL) 1200 MG  CAPS Take 1,200 mg by mouth at bedtime.      No current facility-administered medications for this visit.     Past Medical History:  Diagnosis Date  . Anemia   . Coronary atherosclerosis of native coronary artery    Previous PCI 2007-2008, Dr. Sharyon Cable  . Essential hypertension, benign   . Mixed hyperlipidemia   . PAD (peripheral artery disease) ([facility])     Absent right DP  . Palpitations   . Type 2 diabetes mellitus ([place])   . Vasomotor rhinitis     Past Surgical History:  Procedure Laterality Date  . ABDOMINAL HYSTERECTOMY    . CARDIAC CATHETERIZATION N/A 08/17/2015   Procedure: Left Heart Cath and Coronary Angiography;  Surgeon: Burnell Blanks, MD;  Location: [facility];  Service: Cardiovascular;   Laterality: N/A;  . CORONARY ARTERY BYPASS GRAFT  2006   [name], single vessel    Social History   Social History  . Marital status: Married    Spouse name: N/A  . Number of children: N/A  . Years of education: N/A   Occupational History  . Not on file.   Social History Main Topics  . Smoking status: Never Smoker  . Smokeless tobacco: Never Used  . Alcohol use No  . Drug use: No  . Sexual activity: Not on file   Other Topics Concern  . Not on file   Social History Narrative  . No narrative on file     Vitals:   03/04/17 1410  BP: (!) 152/84  Pulse: 63  SpO2: 96%  Weight: 199 lb (90.3 kg)  Height: 5\' 5"  (1.651 m)    Wt Readings from Last 3 Encounters:  03/04/17 199 lb (90.3 kg)  12/19/15 199 lb (90.3 kg)  08/30/15 200 lb (90.7 kg)     PHYSICAL EXAM General: NAD HEENT: Normal. Neck: No JVD, no thyromegaly. Lungs: Clear to auscultation bilaterally with normal respiratory effort. CV: Nondisplaced PMI.  Regular rate and rhythm, normal S1/S2, no S3/S4, no murmur. No pretibial or periankle edema.  No carotid bruit.   Abdomen: Soft, nontender, no distention.  Neurologic: Alert and oriented.  Psych: Normal affect. Skin: Normal. Musculoskeletal: No gross deformities.    ECG: Most recent ECG reviewed.   Labs: Lab Results  Component Value Date/Time   K 3.8 08/17/2015 03:54 AM   BUN 14 08/17/2015 03:54 AM   CREATININE 0.69 08/17/2015 03:54 AM   TSH 4.048 08/17/2015 03:54 AM   HGB 13.4 08/18/2015 03:12 AM     Lipids: Lab Results  Component Value Date/Time   LDLCALC 74 08/17/2015 04:12 AM   CHOL 136 08/17/2015 04:12 AM   TRIG 111 08/17/2015 04:12 AM   HDL 40 (L) 08/17/2015 04:12 AM       ASSESSMENT AND PLAN: 1. CAD with CABG and occluded LIMA graft to LAD: Stable ischemic heart disease. Continue aspirin, Lipitor, Plavix, Imdur, and Bystolic.  2. Essential HTN: Elevated. I have asked the patient to check blood pressure readings 4 times per week,  at different times throughout the day, in order to get a better approximation of mean BP values. These results will be provided to me at the end of that period so that I can determine if antihypertensive medication titration is indicated. If it remains elevated, I will increase losartan to 75 mg daily. Continue amlodipine 10 mg.  3. Hyperlipidemia: Continue Lipitor 10 mg.  4. Venous varicosities/left leg swelling: Compression stockings.     Disposition: Follow up  6 months.  Kate Sable, M.D., F.A.C.C.

## 2017-03-04 NOTE — Patient Instructions (Signed)
Medication Instructions:  Your physician recommends that you continue on your current medications as directed. Please refer to the Current Medication list given to you today.  Labwork: NONE  Testing/Procedures: NONE  Follow-Up: Your physician wants you to follow-up in: [place]. Bronson Ing. You will receive a reminder letter in the mail two months in advance. If you don't receive a letter, please call our office to schedule the follow-up appointment.  Any Other Special Instructions Will Be Listed Below (If Applicable). Please check your bp around 4 times a week and call office in 1 month with results.   If you need a refill on your cardiac medications before your next appointment, please call your pharmacy.

## 2017-04-20 DIAGNOSIS — E119 Type 2 diabetes mellitus without complications: Secondary | ICD-10-CM | POA: Diagnosis not present

## 2017-04-28 DIAGNOSIS — I10 Essential (primary) hypertension: Secondary | ICD-10-CM | POA: Diagnosis not present

## 2017-04-28 DIAGNOSIS — Z299 Encounter for prophylactic measures, unspecified: Secondary | ICD-10-CM | POA: Diagnosis not present

## 2017-04-28 DIAGNOSIS — E669 Obesity, unspecified: Secondary | ICD-10-CM | POA: Diagnosis not present

## 2017-04-28 DIAGNOSIS — E1165 Type 2 diabetes mellitus with hyperglycemia: Secondary | ICD-10-CM | POA: Diagnosis not present

## 2017-04-28 DIAGNOSIS — I2510 Atherosclerotic heart disease of native coronary artery without angina pectoris: Secondary | ICD-10-CM | POA: Diagnosis not present

## 2017-04-28 DIAGNOSIS — E785 Hyperlipidemia, unspecified: Secondary | ICD-10-CM | POA: Diagnosis not present

## 2017-05-07 ENCOUNTER — Encounter: Payer: Self-pay | Admitting: *Deleted

## 2017-05-07 ENCOUNTER — Telehealth: Payer: Self-pay | Admitting: *Deleted

## 2017-05-07 MED ORDER — LOSARTAN POTASSIUM 50 MG PO TABS: 75.0000 mg | ORAL_TABLET | Freq: Every day | ORAL | 1 refills | Status: DC

## 2017-05-07 NOTE — Telephone Encounter (Signed)
Please obtain a copy of a recent BMET. I will determine the best medication to add for her hypertension afterwards. For now, continue amlodipine 10 mg and losartan 100 mg.

## 2017-05-07 NOTE — Telephone Encounter (Signed)
Pt aware and will request lab work from Dr Woody Seller - called pharmacy to cx losartan 75 mg daily - updated medication list for now until labs obtained

## 2017-05-07 NOTE — Telephone Encounter (Signed)
Pt dropped off BP readings as per LOV - Dr Bronson Ing increased Losartan 75 mg daily - pt made aware and voiced understanding - new rx sent to [facility] as requested - BP log sent to be scanned into chart

## 2017-05-07 NOTE — Telephone Encounter (Signed)
Pt says she was actually taking losartan 50 mg bid - so losartan 75 mg daily would actually be a decrease - says she did not realize this at LOV or when I called this morning

## 2017-05-07 NOTE — Addendum Note (Signed)
Addended by: Julian Hy T on: 05/07/2017 09:52 AM   Modules accepted: Orders

## 2017-05-07 NOTE — Telephone Encounter (Signed)
Please call patient back    She needs to speak with someone before they call Rx in

## 2017-08-03 ENCOUNTER — Other Ambulatory Visit: Payer: Self-pay | Admitting: Cardiovascular Disease

## 2017-08-04 ENCOUNTER — Encounter: Payer: Self-pay | Admitting: Cardiovascular Disease

## 2017-08-04 DIAGNOSIS — I10 Essential (primary) hypertension: Secondary | ICD-10-CM | POA: Diagnosis not present

## 2017-08-04 DIAGNOSIS — Z299 Encounter for prophylactic measures, unspecified: Secondary | ICD-10-CM | POA: Diagnosis not present

## 2017-08-04 DIAGNOSIS — Z2821 Immunization not carried out because of patient refusal: Secondary | ICD-10-CM | POA: Diagnosis not present

## 2017-08-04 DIAGNOSIS — Z6833 Body mass index (BMI) 33.0-33.9, adult: Secondary | ICD-10-CM | POA: Diagnosis not present

## 2017-08-04 DIAGNOSIS — E1165 Type 2 diabetes mellitus with hyperglycemia: Secondary | ICD-10-CM | POA: Diagnosis not present

## 2017-08-13 ENCOUNTER — Other Ambulatory Visit: Payer: Self-pay | Admitting: Cardiovascular Disease

## 2017-08-31 ENCOUNTER — Encounter: Payer: Self-pay | Admitting: *Deleted

## 2017-08-31 ENCOUNTER — Telehealth: Payer: Self-pay | Admitting: Cardiovascular Disease

## 2017-08-31 ENCOUNTER — Encounter: Payer: Self-pay | Admitting: Cardiovascular Disease

## 2017-08-31 ENCOUNTER — Ambulatory Visit (INDEPENDENT_AMBULATORY_CARE_PROVIDER_SITE_OTHER): Payer: Medicare Other | Admitting: Cardiovascular Disease

## 2017-08-31 VITALS — BP 124/80 | HR 67 | Ht 65.0 in | Wt 200.0 lb

## 2017-08-31 DIAGNOSIS — I25708 Atherosclerosis of coronary artery bypass graft(s), unspecified, with other forms of angina pectoris: Secondary | ICD-10-CM

## 2017-08-31 DIAGNOSIS — E7800 Pure hypercholesterolemia, unspecified: Secondary | ICD-10-CM

## 2017-08-31 DIAGNOSIS — I10 Essential (primary) hypertension: Secondary | ICD-10-CM

## 2017-08-31 DIAGNOSIS — I209 Angina pectoris, unspecified: Secondary | ICD-10-CM | POA: Diagnosis not present

## 2017-08-31 MED ORDER — ISOSORBIDE MONONITRATE ER 60 MG PO TB24: 60.0000 mg | ORAL_TABLET | Freq: Every day | ORAL | 3 refills | Status: DC

## 2017-08-31 MED ORDER — ATORVASTATIN CALCIUM 20 MG PO TABS: 20.0000 mg | ORAL_TABLET | Freq: Every day | ORAL | 3 refills | Status: DC

## 2017-08-31 NOTE — Patient Instructions (Signed)
Medication Instructions:   Increase Lipitor to 20mg  daily.  Increase Imdur to 60mg  daily.  Continue all other medications.    Labwork: none  Testing/Procedures:  Your physician has requested that you have en exercise stress myoview. For further information please visit [email or web address]. Please follow instruction sheet, as given.  Office will contact with results via phone or letter.    Follow-Up: 2 months   Any Other Special Instructions Will Be Listed Below (If Applicable).  If you need a refill on your cardiac medications before your next appointment, please call your pharmacy.

## 2017-08-31 NOTE — Progress Notes (Signed)
SUBJECTIVE: The patient presents for follow-up of coronary artery disease with prior CABG. She underwent coronary angiography on 08/17/2015 which demonstrated the following:  1. Single vessel CAD with patent stent mid LAD with mild restenosis. 2. Severe disease in a small caliber diagonal branch. The ostium of this branch is jailed by the LAD stent. The vessel is 1.5 mm and too small for PCI.  3. Mild non-obstructive disease in the RCA and Circumflex artery.  4. Normal LV systolic function 5. Known occlusion of the LIMA graft to the LAD (non injected)  Echocardiogram on 08/17/15 showed normal left ventricular systolic function and regional wall motion, EF 45-80%, grade 1 diastolic dysfunction, and aortic valve sclerosis with trivial regurgitation.  ECG performed today which I personally interpreted demonstrated sinus rhythm with T wave inversions in V2.  She has been having episodic angina at rest.  Last Saturday she took 2 nitroglycerin tablets to alleviate symptoms.    Review of Systems: As per "subjective", otherwise negative.  Allergies  Allergen Reactions  . Sulfonamide Derivatives     REACTION: Unknown    Current Outpatient Medications  Medication Sig Dispense Refill  . acetaminophen (TYLENOL) 325 MG tablet Take 2 tablets (650 mg total) by mouth every 4 (four) hours as needed for headache or mild pain.    Marland Kitchen amLODipine (NORVASC) 10 MG tablet TAKE ONE TABLET BY MOUTH ONCE DAILY 90 tablet 3  . Ascorbic Acid (VITAMIN C) 1000 MG tablet Take 1,000 mg by mouth 2 (two) times daily.    Marland Kitchen aspirin 81 MG EC tablet Take 81 mg by mouth daily.    Marland Kitchen atorvastatin (LIPITOR) 10 MG tablet Take 10 mg by mouth daily at 6 PM.     . Calcium Carbonate-Vitamin D 600-400 MG-UNIT per tablet Take 1 tablet by mouth 2 (two) times daily.    . Cholecalciferol (VITAMIN D3) 5000 UNITS CAPS Take 5,000 Units by mouth 2 (two) times daily.     . clopidogrel (PLAVIX) 75 MG tablet Take 75 mg by mouth  daily.    Marland Kitchen docusate sodium (COLACE) 100 MG capsule Take 100 mg by mouth daily as needed for mild constipation or moderate constipation.     . Esomeprazole Magnesium (NEXIUM PO) Take by mouth daily.     . ferrous gluconate (FERGON) 324 MG tablet Take 324 mg by mouth at bedtime.     . fexofenadine (ALLEGRA) 180 MG tablet Take 180 mg by mouth daily.    Marland Kitchen glucosamine-chondroitin 500-400 MG tablet Take 1 tablet by mouth 2 (two) times daily.     . isosorbide mononitrate (IMDUR) 30 MG 24 hr tablet TAKE ONE TABLET BY MOUTH ONCE DAILY 90 tablet 3  . losartan (COZAAR) 50 MG tablet Take 50 mg by mouth 2 (two) times daily.    . metformin (FORTAMET) 500 MG (OSM) 24 hr tablet Take 1 tablet (500 mg total) by mouth at bedtime.    . Multiple Vitamin (MULTIVITAMIN) tablet Take 1 tablet by mouth daily.    . nebivolol (BYSTOLIC) 5 MG tablet Take 1 tablet (5 mg total) by mouth daily. 30 tablet 6  . nitroGLYCERIN (NITROLINGUAL) 0.4 MG/SPRAY spray Place 1 spray under the tongue every 5 (five) minutes x 3 doses as needed for chest pain. 4.9 g 3  . Omega-3 Fatty Acids (FISH OIL) 1200 MG CAPS Take 1,200 mg by mouth at bedtime.      No current facility-administered medications for this visit.     Past Medical  History:  Diagnosis Date  . Anemia   . Coronary atherosclerosis of native coronary artery    Previous PCI 2007-2008, Dr. Sharyon Cable  . Essential hypertension, benign   . Mixed hyperlipidemia   . PAD (peripheral artery disease) (HCC)     Absent right DP  . Palpitations   . Type 2 diabetes mellitus ([place])   . Vasomotor rhinitis     Past Surgical History:  Procedure Laterality Date  . ABDOMINAL HYSTERECTOMY    . CARDIAC CATHETERIZATION N/A 08/17/2015   Procedure: Left Heart Cath and Coronary Angiography;  Surgeon: Burnell Blanks, MD;  Location: [facility];  Service: Cardiovascular;  Laterality: N/A;  . CORONARY ARTERY BYPASS GRAFT  2006   [name], single vessel    Social History    Socioeconomic History  . Marital status: Married    Spouse name: Not on file  . Number of children: Not on file  . Years of education: Not on file  . Highest education level: Not on file  Social Needs  . Financial resource strain: Not on file  . Food insecurity - worry: Not on file  . Food insecurity - inability: Not on file  . Transportation needs - medical: Not on file  . Transportation needs - non-medical: Not on file  Occupational History  . Not on file  Tobacco Use  . Smoking status: Never Smoker  . Smokeless tobacco: Never Used  Substance and Sexual Activity  . Alcohol use: No    Alcohol/week: 0.0 oz  . Drug use: No  . Sexual activity: Not on file  Other Topics Concern  . Not on file  Social History Narrative  . Not on file     Vitals:   08/31/17 1044  BP: 124/80  Pulse: 67  SpO2: 98%  Weight: 200 lb (90.7 kg)  Height: 5\' 5"  (1.651 m)    Wt Readings from Last 3 Encounters:  08/31/17 200 lb (90.7 kg)  03/04/17 199 lb (90.3 kg)  12/19/15 199 lb (90.3 kg)     PHYSICAL EXAM General: NAD HEENT: Normal. Neck: No JVD, no thyromegaly. Lungs: Clear to auscultation bilaterally with normal respiratory effort. CV: Regular rate and rhythm, normal S1/S2, no S3/S4, no murmur. No pretibial or periankle edema.  No carotid bruit.   Abdomen: Soft, nontender, no distention.  Neurologic: Alert and oriented.  Psych: Normal affect. Skin: Normal. Musculoskeletal: No gross deformities.    ECG: Most recent ECG reviewed.   Labs: Lab Results  Component Value Date/Time   K 3.8 08/17/2015 03:54 AM   BUN 14 08/17/2015 03:54 AM   CREATININE 0.69 08/17/2015 03:54 AM   TSH 4.048 08/17/2015 03:54 AM   HGB 13.4 08/18/2015 03:12 AM     Lipids: Lab Results  Component Value Date/Time   LDLCALC 74 08/17/2015 04:12 AM   CHOL 136 08/17/2015 04:12 AM   TRIG 111 08/17/2015 04:12 AM   HDL 40 (L) 08/17/2015 04:12 AM       ASSESSMENT AND PLAN: 1. CAD with CABG and  occluded LIMA graft to LAD with angina:  She has been having recurrent anginal symptoms.  I will increase Imdur to 60 mg daily.  I will obtain an exercise Myoview stress test to assess ischemic burden.  CABG was in 2006.  Continue aspirin, Lipitor (I will increase to 20 mg daily), Plavix, and Bystolic.  2. Essential HTN:  Controlled.  No changes to therapy.  I will monitor given increase in Imdur dose as noted above.  3. Hyperlipidemia: I will obtain a copy of recent lipids from PCP.  LDL 90 on 01/21/17.  Goal is at least less than 70.  I will increase Lipitor to 20 mg daily.  4. Venous varicosities/left leg swelling: Continue compression stockings.      Disposition: Follow up 2 months   Kate Sable, M.D., F.A.C.C.

## 2017-08-31 NOTE — Addendum Note (Signed)
Addended by: Laurine Blazer on: 08/31/2017 11:22 AM   Modules accepted: Orders

## 2017-08-31 NOTE — Telephone Encounter (Signed)
Pre-cert Verification for the following procedure   Exercise Myoview - angina pectoris  Scheduled for 09-15-2017

## 2017-09-15 ENCOUNTER — Encounter (HOSPITAL_COMMUNITY)
Admission: RE | Admit: 2017-09-15 | Discharge: 2017-09-15 | Disposition: A | Discharge status: Home / Self Care | Payer: Medicare Other | Source: Ambulatory Visit | Attending: Cardiovascular Disease | Admitting: Cardiovascular Disease

## 2017-09-15 ENCOUNTER — Encounter (HOSPITAL_BASED_OUTPATIENT_CLINIC_OR_DEPARTMENT_OTHER)
Admission: RE | Admit: 2017-09-15 | Discharge: 2017-09-15 | Disposition: A | Discharge status: Home / Self Care | Payer: Medicare Other | Source: Ambulatory Visit | Attending: Cardiovascular Disease | Admitting: Cardiovascular Disease

## 2017-09-15 ENCOUNTER — Encounter (HOSPITAL_COMMUNITY): Payer: Self-pay

## 2017-09-15 DIAGNOSIS — I209 Angina pectoris, unspecified: Secondary | ICD-10-CM

## 2017-09-15 LAB — NM MYOCAR MULTI W/SPECT W/WALL MOTION / EF
CHL CUP NUCLEAR SDS: 0
CHL CUP RESTING HR STRESS: 63 {beats}/min
CSEPEDS: 27 s
CSEPPHR: 133 {beats}/min
Estimated workload: 5.8 METS
Exercise duration (min): 4 min
LHR: 0.51
LVDIAVOL: 101 mL (ref 46–106)
LVSYSVOL: 42 mL
MPHR: 146 {beats}/min
Percent HR: 91 %
RPE: 19
SRS: 0
SSS: 0
TID: 1.15

## 2017-09-15 MED ORDER — TECHNETIUM TC 99M TETROFOSMIN IV KIT
30.0000 | PACK | Freq: Once | INTRAVENOUS | Status: AC | PRN
  Administered 2017-09-15: 30 via INTRAVENOUS

## 2017-09-15 MED ORDER — REGADENOSON 0.4 MG/5ML IV SOLN
INTRAVENOUS | Status: AC
  Filled 2017-09-15: qty 5

## 2017-09-15 MED ORDER — SODIUM CHLORIDE 0.9% FLUSH
INTRAVENOUS | Status: AC
  Administered 2017-09-15: 10 mL via INTRAVENOUS
  Filled 2017-09-15: qty 10

## 2017-09-15 MED ORDER — TECHNETIUM TC 99M TETROFOSMIN IV KIT
10.0000 | PACK | Freq: Once | INTRAVENOUS | Status: AC | PRN
  Administered 2017-09-15: 10 via INTRAVENOUS

## 2017-09-18 ENCOUNTER — Telehealth: Payer: Self-pay | Admitting: *Deleted

## 2017-09-18 NOTE — Telephone Encounter (Signed)
Notes recorded by Laurine Blazer, LPN on 5/36/4680 at 3:21 AM EST Patient notified. Copy to pmd. Follow up scheduled for 10/30/2017 with Dr. Bronson Ing.   ------  Notes recorded by Herminio Commons, MD on 09/15/2017 at 3:46 PM EST Low risk

## 2017-10-02 DIAGNOSIS — S61419A Laceration without foreign body of unspecified hand, initial encounter: Secondary | ICD-10-CM | POA: Diagnosis not present

## 2017-10-02 DIAGNOSIS — I10 Essential (primary) hypertension: Secondary | ICD-10-CM | POA: Diagnosis not present

## 2017-10-02 DIAGNOSIS — Z299 Encounter for prophylactic measures, unspecified: Secondary | ICD-10-CM | POA: Diagnosis not present

## 2017-10-02 DIAGNOSIS — Z6834 Body mass index (BMI) 34.0-34.9, adult: Secondary | ICD-10-CM | POA: Diagnosis not present

## 2017-10-02 DIAGNOSIS — Z789 Other specified health status: Secondary | ICD-10-CM | POA: Diagnosis not present

## 2017-10-13 ENCOUNTER — Telehealth: Payer: Self-pay | Admitting: Cardiovascular Disease

## 2017-10-13 MED ORDER — ATORVASTATIN CALCIUM 20 MG PO TABS: 20.0000 mg | ORAL_TABLET | Freq: Every day | ORAL | 3 refills | Status: DC

## 2017-10-13 NOTE — Telephone Encounter (Signed)
Please call Mrs. Willaims in regards to her medication atorvastatin (LIPITOR) 20 MG  States that her prescription is for 10mg  and she was told to double. She has no medication. [place], [place]

## 2017-10-13 NOTE — Telephone Encounter (Signed)
RX Faxed.

## 2017-10-30 ENCOUNTER — Encounter: Payer: Self-pay | Admitting: Cardiovascular Disease

## 2017-10-30 ENCOUNTER — Encounter: Payer: Self-pay | Admitting: *Deleted

## 2017-10-30 ENCOUNTER — Ambulatory Visit (INDEPENDENT_AMBULATORY_CARE_PROVIDER_SITE_OTHER): Payer: Medicare Other | Admitting: Cardiovascular Disease

## 2017-10-30 VITALS — BP 150/78 | HR 78 | Ht 65.0 in | Wt 200.0 lb

## 2017-10-30 DIAGNOSIS — Z955 Presence of coronary angioplasty implant and graft: Secondary | ICD-10-CM | POA: Diagnosis not present

## 2017-10-30 DIAGNOSIS — I209 Angina pectoris, unspecified: Secondary | ICD-10-CM

## 2017-10-30 DIAGNOSIS — I83892 Varicose veins of left lower extremities with other complications: Secondary | ICD-10-CM | POA: Diagnosis not present

## 2017-10-30 DIAGNOSIS — I10 Essential (primary) hypertension: Secondary | ICD-10-CM | POA: Diagnosis not present

## 2017-10-30 DIAGNOSIS — I25708 Atherosclerosis of coronary artery bypass graft(s), unspecified, with other forms of angina pectoris: Secondary | ICD-10-CM | POA: Diagnosis not present

## 2017-10-30 DIAGNOSIS — E782 Mixed hyperlipidemia: Secondary | ICD-10-CM | POA: Diagnosis not present

## 2017-10-30 MED ORDER — RIVAROXABAN 2.5 MG PO TABS: 2.5000 mg | ORAL_TABLET | Freq: Two times a day (BID) | ORAL | 6 refills | Status: DC

## 2017-10-30 NOTE — Patient Instructions (Signed)
Medication Instructions:   Stop Plavix.  Begin Xarelto 2.5mg  twice a day.  Continue all other medications.    Labwork: none  Testing/Procedures: none  Follow-Up: 3 months   Any Other Special Instructions Will Be Listed Below (If Applicable).  If you need a refill on your cardiac medications before your next appointment, please call your pharmacy.

## 2017-10-30 NOTE — Progress Notes (Signed)
SUBJECTIVE: The patient presents for follow-up of coronary artery disease with prior CABG. She underwent coronary angiography on 08/17/2015 which demonstrated the following:  1. Single vessel CAD with patent stent mid LAD with mild restenosis. 2. Severe disease in a small caliber diagonal branch. The ostium of this branch is jailed by the LAD stent. The vessel is 1.5 mm and too small for PCI.  3. Mild non-obstructive disease in the RCA and Circumflex artery.  4. Normal LV systolic function 5. 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  Nuclear stress test 09/15/17 showed no significant myocardial ischemia or scar.  Blood pressure demonstrated a hypertensive response to exercise.  Duke treadmill score was low, LVEF 58%.  Symptoms have improved with respect to chest pain.  Some weeks she does not experience any pain in other weeks she may have it 2 or 3 times per week.  She denies leg swelling and palpitations.      Review of Systems: As per "subjective", otherwise negative.  Allergies  Allergen Reactions  . Sulfonamide Derivatives     REACTION: Unknown    Current Outpatient Medications  Medication Sig Dispense Refill  . acetaminophen (TYLENOL) 325 MG tablet Take 2 tablets (650 mg total) by mouth every 4 (four) hours as needed for headache or mild pain.    Marland Kitchen amLODipine (NORVASC) 10 MG tablet TAKE ONE TABLET BY MOUTH ONCE DAILY 90 tablet 3  . Ascorbic Acid (VITAMIN C) 1000 MG tablet Take 1,000 mg by mouth 2 (two) times daily.    Marland Kitchen aspirin 81 MG EC tablet Take 81 mg by mouth daily.    Marland Kitchen atorvastatin (LIPITOR) 20 MG tablet Take 1 tablet (20 mg total) by mouth daily. 90 tablet 3  . Calcium Carbonate-Vitamin D 600-400 MG-UNIT per tablet Take 1 tablet by mouth 2 (two)  times daily.    . Cholecalciferol (VITAMIN D3) 5000 UNITS CAPS Take 5,000 Units by mouth 2 (two) times daily.     . clopidogrel (PLAVIX) 75 MG tablet Take 75 mg by mouth daily.    Marland Kitchen docusate sodium (COLACE) 100 MG capsule Take 100 mg by mouth daily as needed for mild constipation or moderate constipation.     . Esomeprazole Magnesium (NEXIUM PO) Take by mouth daily.     . ferrous gluconate (FERGON) 324 MG tablet Take 324 mg by mouth at bedtime.     . fexofenadine (ALLEGRA) 180 MG tablet Take 180 mg by mouth daily.    Marland Kitchen glucosamine-chondroitin 500-400 MG tablet Take 1 tablet by mouth 2 (two) times daily.     . isosorbide mononitrate (IMDUR) 30 MG 24 hr tablet Take 30 mg by mouth daily.    Marland Kitchen losartan (COZAAR) 50 MG tablet Take 50 mg by mouth 2 (two) times daily.    . metformin (FORTAMET) 500 MG (OSM) 24 hr tablet Take 1 tablet (500 mg total) by mouth at bedtime.    . Multiple Vitamin (MULTIVITAMIN) tablet Take 1 tablet by mouth daily.    . nebivolol (BYSTOLIC) 5 MG tablet Take 1 tablet (5 mg total) by mouth daily. 30 tablet 6  . nitroGLYCERIN (NITROLINGUAL) 0.4 MG/SPRAY spray Place 1 spray under the tongue every 5 (five) minutes x 3 doses as needed for chest pain. 4.9 g 3  . Omega-3 Fatty Acids (  FISH OIL) 1200 MG CAPS Take 1,200 mg by mouth at bedtime.      No current facility-administered medications for this visit.     Past Medical History:  Diagnosis Date  . Anemia   . Coronary atherosclerosis of native coronary artery    Previous PCI 2007-2008, Dr. Sharyon Cable  . Essential hypertension, benign   . Mixed hyperlipidemia   . PAD (peripheral artery disease) (HCC)     Absent right DP  . Palpitations   . Type 2 diabetes mellitus ([place])   . Vasomotor rhinitis     Past Surgical History:  Procedure Laterality Date  . ABDOMINAL HYSTERECTOMY    . CARDIAC CATHETERIZATION N/A 08/17/2015   Procedure: Left Heart Cath and Coronary Angiography;  Surgeon: Burnell Blanks, MD;  Location: [facility];  Service: Cardiovascular;  Laterality: N/A;  . CORONARY ARTERY BYPASS GRAFT  2006   [name], single vessel    Social History   Socioeconomic History  . Marital status: Married    Spouse name: Not on file  . Number of children: Not on file  . Years of education: Not on file  . Highest education level: Not on file  Social Needs  . Financial resource strain: Not on file  . Food insecurity - worry: Not on file  . Food insecurity - inability: Not on file  . Transportation needs - medical: Not on file  . Transportation needs - non-medical: Not on file  Occupational History  . Not on file  Tobacco Use  . Smoking status: Never Smoker  . Smokeless tobacco: Never Used  Substance and Sexual Activity  . Alcohol use: No    Alcohol/week: 0.0 oz  . Drug use: No  . Sexual activity: Not on file  Other Topics Concern  . Not on file  Social History Narrative  . Not on file     Vitals:   10/30/17 1131  BP: (!) 150/78  Pulse: 78  SpO2: 98%  Weight: 200 lb (90.7 kg)  Height: 5\' 5"  (1.651 m)    Wt Readings from Last 3 Encounters:  10/30/17 200 lb (90.7 kg)  08/31/17 200 lb (90.7 kg)  03/04/17 199 lb (90.3 kg)     PHYSICAL EXAM General: NAD HEENT: Normal. Neck: No JVD, no thyromegaly. Lungs: Clear to auscultation bilaterally with normal respiratory effort. CV: Regular rate and rhythm, normal S1/S2, no S3/S4, no murmur. No pretibial or periankle edema.  No carotid bruit.   Abdomen: Soft, nontender, no distention.  Neurologic: Alert and oriented.  Psych: Normal affect. Skin: Normal. Musculoskeletal: No gross deformities.    ECG: Most recent ECG reviewed.   Labs: Lab Results  Component Value Date/Time   K 3.8 08/17/2015 03:54 AM   BUN 14 08/17/2015 03:54 AM   CREATININE 0.69 08/17/2015 03:54 AM   TSH 4.048 08/17/2015 03:54 AM   HGB 13.4 08/18/2015 03:12 AM     Lipids: Lab Results  Component Value Date/Time   LDLCALC 74 08/17/2015 04:12 AM    CHOL 136 08/17/2015 04:12 AM   TRIG 111 08/17/2015 04:12 AM   HDL 40 (L) 08/17/2015 04:12 AM       ASSESSMENT AND PLAN: 1. CAD with CABG and occluded LIMA graft to LAD with angina:  Symptoms have improved.  Low risk nuclear stress test as detailed above with no significant myocardial ischemia or scar.   Continue Imdur 30 mg daily. CABG was in 2006.  Continue aspirin, Lipitor 20 mg daily, andBystolic.  I  will stop Plavix and start Xarelto 2.5 mg twice daily.  2. Essential HTN:  Blood pressure is elevated today but controlled at last visit.  I will monitor.  3. Hyperlipidemia:I will obtain a copy of recent lipids from PCP.  LDL 90 on 01/21/17.    Continue Lipitor 20 mg daily.  4. Venous varicosities/left leg swelling: Continue compression stockings.     Disposition: Follow up 3 months   Kate Sable, M.D., F.A.C.C.

## 2017-11-11 DIAGNOSIS — E785 Hyperlipidemia, unspecified: Secondary | ICD-10-CM | POA: Diagnosis not present

## 2017-11-11 DIAGNOSIS — I2510 Atherosclerotic heart disease of native coronary artery without angina pectoris: Secondary | ICD-10-CM | POA: Diagnosis not present

## 2017-11-11 DIAGNOSIS — E1165 Type 2 diabetes mellitus with hyperglycemia: Secondary | ICD-10-CM | POA: Diagnosis not present

## 2017-11-11 DIAGNOSIS — Z6833 Body mass index (BMI) 33.0-33.9, adult: Secondary | ICD-10-CM | POA: Diagnosis not present

## 2017-11-11 DIAGNOSIS — Z299 Encounter for prophylactic measures, unspecified: Secondary | ICD-10-CM | POA: Diagnosis not present

## 2017-11-11 DIAGNOSIS — I10 Essential (primary) hypertension: Secondary | ICD-10-CM | POA: Diagnosis not present

## 2017-11-19 ENCOUNTER — Telehealth: Payer: Self-pay | Admitting: *Deleted

## 2017-11-19 MED ORDER — ATORVASTATIN CALCIUM 40 MG PO TABS: 40.0000 mg | ORAL_TABLET | Freq: Every day | ORAL | 3 refills | Status: DC

## 2017-11-19 NOTE — Telephone Encounter (Signed)
Recent labs received from pmd.  Per Dr. Bronson Ing review - LDL not at goal.  LDL on 01-22-2017 was 90.  Increase Lipitor to 40mg  daily & repeat lipids in 3 months.  Will mail reminder when time.  New prescription sent to pharmacy now.

## 2017-12-10 DIAGNOSIS — Z299 Encounter for prophylactic measures, unspecified: Secondary | ICD-10-CM | POA: Diagnosis not present

## 2017-12-10 DIAGNOSIS — Z6832 Body mass index (BMI) 32.0-32.9, adult: Secondary | ICD-10-CM | POA: Diagnosis not present

## 2017-12-10 DIAGNOSIS — J069 Acute upper respiratory infection, unspecified: Secondary | ICD-10-CM | POA: Diagnosis not present

## 2017-12-10 DIAGNOSIS — E785 Hyperlipidemia, unspecified: Secondary | ICD-10-CM | POA: Diagnosis not present

## 2017-12-10 DIAGNOSIS — Z789 Other specified health status: Secondary | ICD-10-CM | POA: Diagnosis not present

## 2018-01-27 DIAGNOSIS — Z6833 Body mass index (BMI) 33.0-33.9, adult: Secondary | ICD-10-CM | POA: Diagnosis not present

## 2018-01-27 DIAGNOSIS — Z1211 Encounter for screening for malignant neoplasm of colon: Secondary | ICD-10-CM | POA: Diagnosis not present

## 2018-01-27 DIAGNOSIS — Z1339 Encounter for screening examination for other mental health and behavioral disorders: Secondary | ICD-10-CM | POA: Diagnosis not present

## 2018-01-27 DIAGNOSIS — Z299 Encounter for prophylactic measures, unspecified: Secondary | ICD-10-CM | POA: Diagnosis not present

## 2018-01-27 DIAGNOSIS — I2510 Atherosclerotic heart disease of native coronary artery without angina pectoris: Secondary | ICD-10-CM | POA: Diagnosis not present

## 2018-01-27 DIAGNOSIS — Z79899 Other long term (current) drug therapy: Secondary | ICD-10-CM | POA: Diagnosis not present

## 2018-01-27 DIAGNOSIS — E785 Hyperlipidemia, unspecified: Secondary | ICD-10-CM | POA: Diagnosis not present

## 2018-01-27 DIAGNOSIS — E1165 Type 2 diabetes mellitus with hyperglycemia: Secondary | ICD-10-CM | POA: Diagnosis not present

## 2018-01-27 DIAGNOSIS — I10 Essential (primary) hypertension: Secondary | ICD-10-CM | POA: Diagnosis not present

## 2018-01-27 DIAGNOSIS — Z1331 Encounter for screening for depression: Secondary | ICD-10-CM | POA: Diagnosis not present

## 2018-01-27 DIAGNOSIS — Z7189 Other specified counseling: Secondary | ICD-10-CM | POA: Diagnosis not present

## 2018-01-27 DIAGNOSIS — Z0000 Encounter for general adult medical examination without abnormal findings: Secondary | ICD-10-CM | POA: Diagnosis not present

## 2018-02-02 ENCOUNTER — Ambulatory Visit: Payer: Medicare Other | Admitting: Cardiology

## 2018-02-08 ENCOUNTER — Encounter: Payer: Self-pay | Admitting: Cardiovascular Disease

## 2018-02-08 ENCOUNTER — Ambulatory Visit (INDEPENDENT_AMBULATORY_CARE_PROVIDER_SITE_OTHER): Payer: Medicare Other | Admitting: Cardiovascular Disease

## 2018-02-08 VITALS — BP 122/72 | HR 66 | Ht 65.5 in | Wt 198.0 lb

## 2018-02-08 DIAGNOSIS — I25708 Atherosclerosis of coronary artery bypass graft(s), unspecified, with other forms of angina pectoris: Secondary | ICD-10-CM | POA: Diagnosis not present

## 2018-02-08 DIAGNOSIS — I209 Angina pectoris, unspecified: Secondary | ICD-10-CM

## 2018-02-08 DIAGNOSIS — I10 Essential (primary) hypertension: Secondary | ICD-10-CM

## 2018-02-08 DIAGNOSIS — E785 Hyperlipidemia, unspecified: Secondary | ICD-10-CM

## 2018-02-08 MED ORDER — ISOSORBIDE MONONITRATE ER 30 MG PO TB24: 30.0000 mg | ORAL_TABLET | Freq: Every day | ORAL | 3 refills | Status: DC

## 2018-02-08 NOTE — Patient Instructions (Signed)

## 2018-02-08 NOTE — Progress Notes (Signed)
SUBJECTIVE: The patient presents for routine follow-up.  She has a history of coronary artery disease and CABG.  She underwent coronary angiography on 08/17/2015 which demonstrated the following:  1. Single vessel CAD with patent stent mid LAD with mild restenosis. 2. Severe disease in a small caliber diagonal branch. The ostium of this branch is jailed by the LAD stent. The vessel is 1.5 mm and too small for PCI.  3. Mild non-obstructive disease in the RCA and Circumflex artery.  4. Normal LV systolic function 5. 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  Nuclear stress test 09/15/17 showed no significant myocardial ischemia or scar.  Blood pressure demonstrated a hypertensive response to exercise.  Duke treadmill score was low, LVEF 58%.  She is doing relatively well overall.  Chest pain episodes have diminished in frequency and intensity.  She seldom has palpitations.  She denies shortness of breath, leg swelling, orthopnea, and paroxysmal nocturnal dyspnea.     Review of Systems: As per "subjective", otherwise negative.  Allergies  Allergen Reactions  . Sulfonamide Derivatives     REACTION: Unknown    Current Outpatient Medications  Medication Sig Dispense Refill  . acetaminophen (TYLENOL) 325 MG tablet Take 2 tablets (650 mg total) by mouth every 4 (four) hours as needed for headache or mild pain.    Marland Kitchen amLODipine (NORVASC) 10 MG tablet TAKE ONE TABLET BY MOUTH ONCE DAILY 90 tablet 3  . Ascorbic Acid (VITAMIN C) 1000 MG tablet Take 1,000 mg by mouth 2 (two) times daily.    Marland Kitchen aspirin 81 MG EC tablet Take 81 mg by mouth daily.    Marland Kitchen atorvastatin (LIPITOR) 40 MG tablet Take 1 tablet (40 mg total) by mouth daily. 90 tablet 3  . Calcium Carbonate-Vitamin D  600-400 MG-UNIT per tablet Take 1 tablet by mouth 2 (two) times daily.    . Cholecalciferol (VITAMIN D3) 5000 UNITS CAPS Take 5,000 Units by mouth 2 (two) times daily.     Marland Kitchen docusate sodium (COLACE) 100 MG capsule Take 100 mg by mouth daily as needed for mild constipation or moderate constipation.     . Esomeprazole Magnesium (NEXIUM PO) Take by mouth daily.     . ferrous gluconate (FERGON) 324 MG tablet Take 324 mg by mouth at bedtime.     . fexofenadine (ALLEGRA) 180 MG tablet Take 180 mg by mouth daily.    Marland Kitchen glucosamine-chondroitin 500-400 MG tablet Take 1 tablet by mouth 2 (two) times daily.     . isosorbide mononitrate (IMDUR) 30 MG 24 hr tablet Take 30 mg by mouth daily.    Marland Kitchen losartan (COZAAR) 50 MG tablet Take 50 mg by mouth 2 (two) times daily.    . metformin (FORTAMET) 500 MG (OSM) 24 hr tablet Take 1 tablet (500 mg total) by mouth at bedtime.    . Multiple Vitamin (MULTIVITAMIN) tablet Take 1 tablet by mouth daily.    . nebivolol (BYSTOLIC) 5 MG tablet Take 1 tablet (5 mg total) by mouth daily. 30 tablet 6  . nitroGLYCERIN (NITROLINGUAL) 0.4 MG/SPRAY spray Place 1 spray under the tongue every 5 (five) minutes x 3 doses as needed for chest pain. 4.9 g 3  . Omega-3 Fatty Acids (FISH OIL) 1200 MG CAPS Take 1,200 mg by mouth at bedtime.     Marland Kitchen  rivaroxaban (XARELTO) 2.5 MG TABS tablet Take 1 tablet (2.5 mg total) by mouth 2 (two) times daily. 60 tablet 6   No current facility-administered medications for this visit.     Past Medical History:  Diagnosis Date  . Anemia   . Coronary atherosclerosis of native coronary artery    Previous PCI 2007-2008, Dr. Sharyon Cable  . Essential hypertension, benign   . Mixed hyperlipidemia   . PAD (peripheral artery disease) (HCC)     Absent right DP  . Palpitations   . Type 2 diabetes mellitus ([place])   . Vasomotor rhinitis     Past Surgical History:  Procedure Laterality Date  . ABDOMINAL HYSTERECTOMY    . CARDIAC CATHETERIZATION N/A 08/17/2015    Procedure: Left Heart Cath and Coronary Angiography;  Surgeon: Burnell Blanks, MD;  Location: [facility];  Service: Cardiovascular;  Laterality: N/A;  . CORONARY ARTERY BYPASS GRAFT  2006   [name], single vessel    Social History   Socioeconomic History  . Marital status: Married    Spouse name: Not on file  . Number of children: Not on file  . Years of education: Not on file  . Highest education level: Not on file  Occupational History  . Not on file  Social Needs  . Financial resource strain: Not on file  . Food insecurity:    Worry: Not on file    Inability: Not on file  . Transportation needs:    Medical: Not on file    Non-medical: Not on file  Tobacco Use  . Smoking status: Never Smoker  . Smokeless tobacco: Never Used  Substance and Sexual Activity  . Alcohol use: No    Alcohol/week: 0.0 oz  . Drug use: No  . Sexual activity: Not on file  Lifestyle  . Physical activity:    Days per week: Not on file    Minutes per session: Not on file  . Stress: Not on file  Relationships  . Social connections:    Talks on phone: Not on file    Gets together: Not on file    Attends religious service: Not on file    Active member of club or organization: Not on file    Attends meetings of clubs or organizations: Not on file    Relationship status: Not on file  . Intimate partner violence:    Fear of current or ex partner: Not on file    Emotionally abused: Not on file    Physically abused: Not on file    Forced sexual activity: Not on file  Other Topics Concern  . Not on file  Social History Narrative  . Not on file     Vitals:   02/08/18 0817  BP: 122/72  Pulse: 66  SpO2: 99%  Weight: 198 lb (89.8 kg)  Height: 5' 5.5" (1.664 m)    Wt Readings from Last 3 Encounters:  02/08/18 198 lb (89.8 kg)  10/30/17 200 lb (90.7 kg)  08/31/17 200 lb (90.7 kg)     PHYSICAL EXAM General: NAD HEENT: Normal. Neck: No JVD, no thyromegaly. Lungs: Clear  to auscultation bilaterally with normal respiratory effort. CV: Regular rate and rhythm, normal S1/S2, no S3/S4, no murmur. No pretibial or periankle edema.  No carotid bruit.   Abdomen: Soft, nontender, no distention.  Neurologic: Alert and oriented.  Psych: Normal affect. Skin: Normal. Musculoskeletal: No gross deformities.    ECG: Most recent ECG reviewed.   Labs: Lab Results  Component Value Date/Time   K 3.8 08/17/2015 03:54 AM   BUN 14 08/17/2015 03:54 AM   CREATININE 0.69 08/17/2015 03:54 AM   TSH 4.048 08/17/2015 03:54 AM   HGB 13.4 08/18/2015 03:12 AM     Lipids: Lab Results  Component Value Date/Time   LDLCALC 74 08/17/2015 04:12 AM   CHOL 136 08/17/2015 04:12 AM   TRIG 111 08/17/2015 04:12 AM   HDL 40 (L) 08/17/2015 04:12 AM       ASSESSMENT AND PLAN:  1. CAD with CABG and occluded LIMA graft to LADwith angina:Symptomatically stable.  Low risk nuclear stress test as detailed above with no significant myocardial ischemia or scar.   Continue Imdur 30 mg daily. CABG was in 2006. Continue aspirin, Lipitor 20 mg daily, Xarelto 2.5 mg twice daily, andBystolic.   2. Essential HTN: Blood pressure is normal.  No changes to therapy.  3. Hyperlipidemia:LDL not at goal and thus I increased Lipitor to 40 mg recently.  Lipids will need to be repeated within the next several months.      Disposition: Follow up 6 months   Kate Sable, M.D., F.A.C.C.

## 2018-02-11 DIAGNOSIS — Z79899 Other long term (current) drug therapy: Secondary | ICD-10-CM | POA: Diagnosis not present

## 2018-02-11 DIAGNOSIS — E2839 Other primary ovarian failure: Secondary | ICD-10-CM | POA: Diagnosis not present

## 2018-02-18 DIAGNOSIS — E1165 Type 2 diabetes mellitus with hyperglycemia: Secondary | ICD-10-CM | POA: Diagnosis not present

## 2018-02-18 DIAGNOSIS — I2510 Atherosclerotic heart disease of native coronary artery without angina pectoris: Secondary | ICD-10-CM | POA: Diagnosis not present

## 2018-02-18 DIAGNOSIS — Z6833 Body mass index (BMI) 33.0-33.9, adult: Secondary | ICD-10-CM | POA: Diagnosis not present

## 2018-02-18 DIAGNOSIS — Z299 Encounter for prophylactic measures, unspecified: Secondary | ICD-10-CM | POA: Diagnosis not present

## 2018-02-18 DIAGNOSIS — I10 Essential (primary) hypertension: Secondary | ICD-10-CM | POA: Diagnosis not present

## 2018-03-03 ENCOUNTER — Encounter: Payer: Self-pay | Admitting: *Deleted

## 2018-03-03 ENCOUNTER — Other Ambulatory Visit: Payer: Self-pay | Admitting: *Deleted

## 2018-03-03 DIAGNOSIS — E782 Mixed hyperlipidemia: Secondary | ICD-10-CM

## 2018-03-12 ENCOUNTER — Encounter: Payer: Self-pay | Admitting: *Deleted

## 2018-03-29 DIAGNOSIS — Z85028 Personal history of other malignant neoplasm of stomach: Secondary | ICD-10-CM | POA: Diagnosis not present

## 2018-04-01 DIAGNOSIS — R928 Other abnormal and inconclusive findings on diagnostic imaging of breast: Secondary | ICD-10-CM | POA: Diagnosis not present

## 2018-04-01 DIAGNOSIS — Z1231 Encounter for screening mammogram for malignant neoplasm of breast: Secondary | ICD-10-CM | POA: Diagnosis not present

## 2018-04-02 DIAGNOSIS — D131 Benign neoplasm of stomach: Secondary | ICD-10-CM | POA: Diagnosis not present

## 2018-04-02 DIAGNOSIS — Z7902 Long term (current) use of antithrombotics/antiplatelets: Secondary | ICD-10-CM | POA: Diagnosis not present

## 2018-04-02 DIAGNOSIS — K317 Polyp of stomach and duodenum: Secondary | ICD-10-CM | POA: Diagnosis not present

## 2018-04-02 DIAGNOSIS — Z7982 Long term (current) use of aspirin: Secondary | ICD-10-CM | POA: Diagnosis not present

## 2018-04-02 DIAGNOSIS — Z85020 Personal history of malignant carcinoid tumor of stomach: Secondary | ICD-10-CM | POA: Diagnosis not present

## 2018-04-02 DIAGNOSIS — Z79899 Other long term (current) drug therapy: Secondary | ICD-10-CM | POA: Diagnosis not present

## 2018-04-02 DIAGNOSIS — K2970 Gastritis, unspecified, without bleeding: Secondary | ICD-10-CM | POA: Diagnosis not present

## 2018-04-02 DIAGNOSIS — I4891 Unspecified atrial fibrillation: Secondary | ICD-10-CM | POA: Diagnosis not present

## 2018-04-02 DIAGNOSIS — K449 Diaphragmatic hernia without obstruction or gangrene: Secondary | ICD-10-CM | POA: Diagnosis not present

## 2018-04-02 DIAGNOSIS — E119 Type 2 diabetes mellitus without complications: Secondary | ICD-10-CM | POA: Diagnosis not present

## 2018-04-07 DIAGNOSIS — R922 Inconclusive mammogram: Secondary | ICD-10-CM | POA: Diagnosis not present

## 2018-04-07 DIAGNOSIS — R928 Other abnormal and inconclusive findings on diagnostic imaging of breast: Secondary | ICD-10-CM | POA: Diagnosis not present

## 2018-04-23 DIAGNOSIS — Z85020 Personal history of malignant carcinoid tumor of stomach: Secondary | ICD-10-CM | POA: Diagnosis not present

## 2018-04-23 DIAGNOSIS — D131 Benign neoplasm of stomach: Secondary | ICD-10-CM | POA: Diagnosis not present

## 2018-04-23 DIAGNOSIS — K449 Diaphragmatic hernia without obstruction or gangrene: Secondary | ICD-10-CM | POA: Diagnosis not present

## 2018-05-27 DIAGNOSIS — E1165 Type 2 diabetes mellitus with hyperglycemia: Secondary | ICD-10-CM | POA: Diagnosis not present

## 2018-05-27 DIAGNOSIS — Z713 Dietary counseling and surveillance: Secondary | ICD-10-CM | POA: Diagnosis not present

## 2018-05-27 DIAGNOSIS — Z299 Encounter for prophylactic measures, unspecified: Secondary | ICD-10-CM | POA: Diagnosis not present

## 2018-05-27 DIAGNOSIS — I10 Essential (primary) hypertension: Secondary | ICD-10-CM | POA: Diagnosis not present

## 2018-05-27 DIAGNOSIS — Z6833 Body mass index (BMI) 33.0-33.9, adult: Secondary | ICD-10-CM | POA: Diagnosis not present

## 2018-07-27 ENCOUNTER — Encounter: Payer: Self-pay | Admitting: *Deleted

## 2018-07-28 ENCOUNTER — Ambulatory Visit: Payer: Medicare Other | Admitting: Cardiovascular Disease

## 2018-08-02 DIAGNOSIS — Z2821 Immunization not carried out because of patient refusal: Secondary | ICD-10-CM | POA: Diagnosis not present

## 2018-08-02 DIAGNOSIS — Z299 Encounter for prophylactic measures, unspecified: Secondary | ICD-10-CM | POA: Diagnosis not present

## 2018-08-02 DIAGNOSIS — I10 Essential (primary) hypertension: Secondary | ICD-10-CM | POA: Diagnosis not present

## 2018-08-02 DIAGNOSIS — Z6832 Body mass index (BMI) 32.0-32.9, adult: Secondary | ICD-10-CM | POA: Diagnosis not present

## 2018-08-02 DIAGNOSIS — J069 Acute upper respiratory infection, unspecified: Secondary | ICD-10-CM | POA: Diagnosis not present

## 2018-08-08 ENCOUNTER — Other Ambulatory Visit: Payer: Self-pay | Admitting: Cardiovascular Disease

## 2018-08-10 ENCOUNTER — Other Ambulatory Visit: Payer: Self-pay | Admitting: Cardiovascular Disease

## 2018-08-24 DIAGNOSIS — E119 Type 2 diabetes mellitus without complications: Secondary | ICD-10-CM | POA: Diagnosis not present

## 2018-08-24 DIAGNOSIS — Z961 Presence of intraocular lens: Secondary | ICD-10-CM | POA: Diagnosis not present

## 2018-08-26 ENCOUNTER — Other Ambulatory Visit: Payer: Self-pay | Admitting: Cardiovascular Disease

## 2018-08-26 ENCOUNTER — Other Ambulatory Visit: Payer: Self-pay | Admitting: *Deleted

## 2018-08-26 MED ORDER — NITROGLYCERIN 0.4 MG/SPRAY TL SOLN: 1.0000 | 3 refills | Status: DC | PRN

## 2018-10-21 ENCOUNTER — Ambulatory Visit (INDEPENDENT_AMBULATORY_CARE_PROVIDER_SITE_OTHER): Payer: Medicare Other | Admitting: Cardiovascular Disease

## 2018-10-21 ENCOUNTER — Encounter: Payer: Self-pay | Admitting: Cardiovascular Disease

## 2018-10-21 VITALS — BP 132/74 | HR 62 | Ht 65.5 in | Wt 196.6 lb

## 2018-10-21 DIAGNOSIS — I25708 Atherosclerosis of coronary artery bypass graft(s), unspecified, with other forms of angina pectoris: Secondary | ICD-10-CM

## 2018-10-21 DIAGNOSIS — E785 Hyperlipidemia, unspecified: Secondary | ICD-10-CM | POA: Diagnosis not present

## 2018-10-21 DIAGNOSIS — I10 Essential (primary) hypertension: Secondary | ICD-10-CM | POA: Diagnosis not present

## 2018-10-21 NOTE — Progress Notes (Signed)
SUBJECTIVE: The patient presents for routine follow-up.  She has chest pains from time to time and takes nitroglycerin as needed.  There has been no increase in frequency or severity since her last visit with me.  She denies shortness of breath.  She seldom has palpitations.  She had been having some leg swelling and tried using compression stockings but felt the swelling was worse.  This has since resolved.  She wishes to come off of Xarelto because it is too expensive.   Review of Systems: As per "subjective", otherwise negative.  Allergies  Allergen Reactions  . Sulfonamide Derivatives     REACTION: Unknown    Current Outpatient Medications  Medication Sig Dispense Refill  . acetaminophen (TYLENOL) 325 MG tablet Take 2 tablets (650 mg total) by mouth every 4 (four) hours as needed for headache or mild pain.    Marland Kitchen amLODipine (NORVASC) 10 MG tablet TAKE 1 TABLET BY MOUTH ONCE DAILY 90 tablet 3  . Ascorbic Acid (VITAMIN C) 1000 MG tablet Take 1,000 mg by mouth 2 (two) times daily.    Marland Kitchen aspirin 81 MG EC tablet Take 81 mg by mouth daily.    Marland Kitchen atorvastatin (LIPITOR) 40 MG tablet Take 1 tablet (40 mg total) by mouth daily. 90 tablet 3  . Calcium Carbonate-Vitamin D 600-400 MG-UNIT per tablet Take 1 tablet by mouth daily.     . Cholecalciferol (VITAMIN D3) 5000 UNITS CAPS Take 5,000 Units by mouth daily.     Marland Kitchen docusate sodium (COLACE) 100 MG capsule Take 100 mg by mouth daily as needed for mild constipation or moderate constipation.     . Esomeprazole Magnesium (NEXIUM PO) Take 2 capsules by mouth daily.     . ferrous gluconate (FERGON) 324 MG tablet Take 324 mg by mouth at bedtime.     . fexofenadine (ALLEGRA) 180 MG tablet Take 180 mg by mouth daily.    Marland Kitchen glucosamine-chondroitin 500-400 MG tablet Take 1 tablet by mouth 2 (two) times daily.     . isosorbide mononitrate (IMDUR) 30 MG 24 hr tablet Take 1 tablet (30 mg total) by mouth daily. 90 tablet 3  . losartan (COZAAR) 50 MG tablet  Take 50 mg by mouth 2 (two) times daily.    . metformin (FORTAMET) 500 MG (OSM) 24 hr tablet Take 1 tablet (500 mg total) by mouth at bedtime.    . Multiple Vitamin (MULTIVITAMIN) tablet Take 1 tablet by mouth daily.    . nebivolol (BYSTOLIC) 5 MG tablet Take 1 tablet (5 mg total) by mouth daily. 30 tablet 6  . nitroGLYCERIN (NITROLINGUAL) 0.4 MG/SPRAY spray Place 1 spray under the tongue every 5 (five) minutes x 3 doses as needed for chest pain (if no relief after 3rd dose, proceed to the ED for an evaluation). 4.9 g 3  . Omega-3 Fatty Acids (FISH OIL) 1200 MG CAPS Take 1,200 mg by mouth at bedtime.     Alveda Reasons 2.5 MG TABS tablet TAKE 1 TABLET BY MOUTH TWICE DAILY 180 tablet 2   No current facility-administered medications for this visit.     Past Medical History:  Diagnosis Date  . Anemia   . Coronary atherosclerosis of native coronary artery    Previous PCI 2007-2008, Dr. Sharyon Cable  . Essential hypertension, benign   . Mixed hyperlipidemia   . PAD (peripheral artery disease) (HCC)     Absent right DP  . Palpitations   . Type 2 diabetes mellitus ([place])   .  Vasomotor rhinitis     Past Surgical History:  Procedure Laterality Date  . ABDOMINAL HYSTERECTOMY    . CARDIAC CATHETERIZATION N/A 08/17/2015   Procedure: Left Heart Cath and Coronary Angiography;  Surgeon: Burnell Blanks, MD;  Location: [facility];  Service: Cardiovascular;  Laterality: N/A;  . CORONARY ARTERY BYPASS GRAFT  2006   [name], single vessel    Social History   Socioeconomic History  . Marital status: Married    Spouse name: Not on file  . Number of children: Not on file  . Years of education: Not on file  . Highest education level: Not on file  Occupational History  . Not on file  Social Needs  . Financial resource strain: Not on file  . Food insecurity:    Worry: Not on file    Inability: Not on file  . Transportation needs:    Medical: Not on file    Non-medical: Not on file    Tobacco Use  . Smoking status: Never Smoker  . Smokeless tobacco: Never Used  Substance and Sexual Activity  . Alcohol use: No    Alcohol/week: 0.0 standard drinks  . Drug use: No  . Sexual activity: Not on file  Lifestyle  . Physical activity:    Days per week: Not on file    Minutes per session: Not on file  . Stress: Not on file  Relationships  . Social connections:    Talks on phone: Not on file    Gets together: Not on file    Attends religious service: Not on file    Active member of club or organization: Not on file    Attends meetings of clubs or organizations: Not on file    Relationship status: Not on file  . Intimate partner violence:    Fear of current or ex partner: Not on file    Emotionally abused: Not on file    Physically abused: Not on file    Forced sexual activity: Not on file  Other Topics Concern  . Not on file  Social History Narrative  . Not on file     Vitals:   10/21/18 0842  BP: 132/74  Pulse: 62  SpO2: 97%  Weight: 196 lb 9.6 oz (89.2 kg)  Height: 5' 5.5" (1.664 m)    Wt Readings from Last 3 Encounters:  10/21/18 196 lb 9.6 oz (89.2 kg)  02/08/18 198 lb (89.8 kg)  10/30/17 200 lb (90.7 kg)     PHYSICAL EXAM General: NAD HEENT: Normal. Neck: No JVD, no thyromegaly. Lungs: Clear to auscultation bilaterally with normal respiratory effort. CV: Regular rate and rhythm, normal S1/S2, no S3/S4, no murmur. No pretibial or periankle edema.  No carotid bruit.   Abdomen: Soft, nontender, no distention.  Neurologic: Alert and oriented.  Psych: Normal affect. Skin: Normal. Musculoskeletal: No gross deformities.    ECG: Reviewed above under Subjective   Labs: Lab Results  Component Value Date/Time   K 3.8 08/17/2015 03:54 AM   BUN 14 08/17/2015 03:54 AM   CREATININE 0.69 08/17/2015 03:54 AM   TSH 4.048 08/17/2015 03:54 AM   HGB 13.4 08/18/2015 03:12 AM     Lipids: Lab Results  Component Value Date/Time   LDLCALC 74  08/17/2015 04:12 AM   CHOL 136 08/17/2015 04:12 AM   TRIG 111 08/17/2015 04:12 AM   HDL 40 (L) 08/17/2015 04:12 AM       Relevant cardiovascular studies:  She underwent coronary angiography  on 08/17/2015 which demonstrated the following:  1. Single vessel CAD with patent stent mid LAD with mild restenosis. 2. Severe disease in a small caliber diagonal branch. The ostium of this branch is jailed by the LAD stent. The vessel is 1.5 mm and too small for PCI.  3. Mild non-obstructive disease in the RCA and Circumflex artery.  4. Normal LV systolic function 5. Known occlusion of the LIMA graft to the LAD (non injected)  Echocardiogram on 08/17/15 showed normal left ventricular systolic function and regional wall motion, EF 48-01%, grade 1 diastolic dysfunction, and aortic valve sclerosis with trivial regurgitation.  Nuclear stress test 09/15/17 showed no significant myocardial ischemia or scar. Blood pressure demonstrated a hypertensive response to exercise. Duke treadmill score was low, LVEF 58%.  ASSESSMENT AND PLAN: 1.  Coronary artery disease: She has a history of CABG with occlusion of the LIMA graft to the LAD.  Anginal symptoms are stable.  Low risk nuclear stress test on 09/15/2017.  Continue aspirin, atorvastatin, Imdur, and Bystolic.  I will discontinue Xarelto as it is too expensive.  2.  Hypertension: Blood pressure is normal.  No changes to therapy.  3.  Hyperlipidemia: On atorvastatin 40 mg.  LDL 62 on 01/27/2018.    Disposition: Follow up 6 months   Kate Sable, M.D., F.A.C.C.

## 2018-10-21 NOTE — Patient Instructions (Addendum)
Medication Instructions:   Stop Xarelto after finish current supply.   Continue all other medications.    Labwork:   Testing/Procedures:   Follow-Up: Your physician wants you to follow up in: 6 months.  You will receive a reminder letter in the mail one-two months in advance.  If you don't receive a letter, please call our office to schedule the follow up appointment   Any Other Special Instructions Will Be Listed Below (If Applicable).  If you need a refill on your cardiac medications before your next appointment, please call your pharmacy.

## 2018-10-27 DIAGNOSIS — I2510 Atherosclerotic heart disease of native coronary artery without angina pectoris: Secondary | ICD-10-CM | POA: Diagnosis not present

## 2018-10-27 DIAGNOSIS — Z299 Encounter for prophylactic measures, unspecified: Secondary | ICD-10-CM | POA: Diagnosis not present

## 2018-10-27 DIAGNOSIS — I10 Essential (primary) hypertension: Secondary | ICD-10-CM | POA: Diagnosis not present

## 2018-10-27 DIAGNOSIS — E785 Hyperlipidemia, unspecified: Secondary | ICD-10-CM | POA: Diagnosis not present

## 2018-10-27 DIAGNOSIS — Z6833 Body mass index (BMI) 33.0-33.9, adult: Secondary | ICD-10-CM | POA: Diagnosis not present

## 2018-10-27 DIAGNOSIS — E1165 Type 2 diabetes mellitus with hyperglycemia: Secondary | ICD-10-CM | POA: Diagnosis not present

## 2019-01-30 ENCOUNTER — Other Ambulatory Visit: Payer: Self-pay | Admitting: Cardiovascular Disease

## 2019-02-02 DIAGNOSIS — Z7189 Other specified counseling: Secondary | ICD-10-CM | POA: Diagnosis not present

## 2019-02-02 DIAGNOSIS — Z79899 Other long term (current) drug therapy: Secondary | ICD-10-CM | POA: Diagnosis not present

## 2019-02-02 DIAGNOSIS — Z0000 Encounter for general adult medical examination without abnormal findings: Secondary | ICD-10-CM | POA: Diagnosis not present

## 2019-02-02 DIAGNOSIS — E1165 Type 2 diabetes mellitus with hyperglycemia: Secondary | ICD-10-CM | POA: Diagnosis not present

## 2019-02-02 DIAGNOSIS — E785 Hyperlipidemia, unspecified: Secondary | ICD-10-CM | POA: Diagnosis not present

## 2019-02-02 DIAGNOSIS — Z6832 Body mass index (BMI) 32.0-32.9, adult: Secondary | ICD-10-CM | POA: Diagnosis not present

## 2019-02-02 DIAGNOSIS — Z1339 Encounter for screening examination for other mental health and behavioral disorders: Secondary | ICD-10-CM | POA: Diagnosis not present

## 2019-02-02 DIAGNOSIS — Z1331 Encounter for screening for depression: Secondary | ICD-10-CM | POA: Diagnosis not present

## 2019-02-02 DIAGNOSIS — Z299 Encounter for prophylactic measures, unspecified: Secondary | ICD-10-CM | POA: Diagnosis not present

## 2019-02-03 ENCOUNTER — Telehealth: Payer: Self-pay | Admitting: *Deleted

## 2019-02-03 NOTE — Telephone Encounter (Signed)
Notes recorded by Laurine Blazer, LPN on 4/93/2419 at 9:14 PM EDT Received labs from pmd. Patient notified. ------  Notes recorded by Herminio Commons, MD on 02/03/2019 at 11:40 AM EDT Good results!

## 2019-02-11 DIAGNOSIS — B070 Plantar wart: Secondary | ICD-10-CM | POA: Diagnosis not present

## 2019-02-11 DIAGNOSIS — M79671 Pain in right foot: Secondary | ICD-10-CM | POA: Diagnosis not present

## 2019-03-04 DIAGNOSIS — B070 Plantar wart: Secondary | ICD-10-CM | POA: Diagnosis not present

## 2019-03-04 DIAGNOSIS — M79671 Pain in right foot: Secondary | ICD-10-CM | POA: Diagnosis not present

## 2019-03-24 ENCOUNTER — Other Ambulatory Visit: Payer: Self-pay | Admitting: Cardiovascular Disease

## 2019-05-12 DIAGNOSIS — I2510 Atherosclerotic heart disease of native coronary artery without angina pectoris: Secondary | ICD-10-CM | POA: Diagnosis not present

## 2019-05-12 DIAGNOSIS — Z6832 Body mass index (BMI) 32.0-32.9, adult: Secondary | ICD-10-CM | POA: Diagnosis not present

## 2019-05-12 DIAGNOSIS — E1165 Type 2 diabetes mellitus with hyperglycemia: Secondary | ICD-10-CM | POA: Diagnosis not present

## 2019-05-12 DIAGNOSIS — I10 Essential (primary) hypertension: Secondary | ICD-10-CM | POA: Diagnosis not present

## 2019-05-12 DIAGNOSIS — Z299 Encounter for prophylactic measures, unspecified: Secondary | ICD-10-CM | POA: Diagnosis not present

## 2019-05-18 ENCOUNTER — Other Ambulatory Visit: Payer: Self-pay

## 2019-05-18 ENCOUNTER — Ambulatory Visit (INDEPENDENT_AMBULATORY_CARE_PROVIDER_SITE_OTHER): Payer: Medicare Other | Admitting: Cardiovascular Disease

## 2019-05-18 ENCOUNTER — Encounter: Payer: Self-pay | Admitting: Cardiovascular Disease

## 2019-05-18 VITALS — BP 118/72 | HR 69 | Temp 97.1°F | Ht 65.0 in | Wt 195.0 lb

## 2019-05-18 DIAGNOSIS — R002 Palpitations: Secondary | ICD-10-CM | POA: Diagnosis not present

## 2019-05-18 DIAGNOSIS — E785 Hyperlipidemia, unspecified: Secondary | ICD-10-CM

## 2019-05-18 DIAGNOSIS — I25708 Atherosclerosis of coronary artery bypass graft(s), unspecified, with other forms of angina pectoris: Secondary | ICD-10-CM | POA: Diagnosis not present

## 2019-05-18 DIAGNOSIS — I10 Essential (primary) hypertension: Secondary | ICD-10-CM | POA: Diagnosis not present

## 2019-05-18 NOTE — Progress Notes (Signed)
SUBJECTIVE: The patient presents for routine follow-up.  She is doing well and denies chest pain, orthopnea, and dizziness.  She has sporadic palpitations which may last a few minutes.  She denies associated shortness of breath.  She has some occasional left leg swelling.    Review of Systems: As per "subjective", otherwise negative.  Allergies  Allergen Reactions  . Sulfonamide Derivatives     REACTION: Unknown    Current Outpatient Medications  Medication Sig Dispense Refill  . acetaminophen (TYLENOL) 325 MG tablet Take 2 tablets (650 mg total) by mouth every 4 (four) hours as needed for headache or mild pain.    Marland Kitchen amLODipine (NORVASC) 10 MG tablet TAKE 1 TABLET BY MOUTH ONCE DAILY 90 tablet 3  . Ascorbic Acid (VITAMIN C) 1000 MG tablet Take 1,000 mg by mouth 2 (two) times daily.    Marland Kitchen aspirin 81 MG EC tablet Take 81 mg by mouth daily.    Marland Kitchen atorvastatin (LIPITOR) 40 MG tablet Take 1 tablet by mouth once daily 90 tablet 1  . Calcium Carbonate-Vitamin D 600-400 MG-UNIT per tablet Take 1 tablet by mouth daily.     . Cholecalciferol (VITAMIN D3) 5000 UNITS CAPS Take 5,000 Units by mouth daily.     Marland Kitchen docusate sodium (COLACE) 100 MG capsule Take 100 mg by mouth daily as needed for mild constipation or moderate constipation.     . Esomeprazole Magnesium (NEXIUM PO) Take 2 capsules by mouth daily.     . ferrous gluconate (FERGON) 324 MG tablet Take 324 mg by mouth at bedtime.     . fexofenadine (ALLEGRA) 180 MG tablet Take 180 mg by mouth daily.    Marland Kitchen glucosamine-chondroitin 500-400 MG tablet Take 1 tablet by mouth 2 (two) times daily.     . isosorbide mononitrate (IMDUR) 30 MG 24 hr tablet Take 1 tablet by mouth once daily 90 tablet 0  . losartan (COZAAR) 50 MG tablet Take 50 mg by mouth 2 (two) times daily.    . metformin (FORTAMET) 500 MG (OSM) 24 hr tablet Take 1 tablet (500 mg total) by mouth at bedtime.    . Multiple Vitamin (MULTIVITAMIN) tablet Take 1 tablet by mouth daily.     . nebivolol (BYSTOLIC) 5 MG tablet Take 1 tablet (5 mg total) by mouth daily. 30 tablet 6  . nitroGLYCERIN (NITROLINGUAL) 0.4 MG/SPRAY spray Place 1 spray under the tongue every 5 (five) minutes x 3 doses as needed for chest pain (if no relief after 3rd dose, proceed to the ED for an evaluation). 4.9 g 3  . Omega-3 Fatty Acids (FISH OIL) 1200 MG CAPS Take 1,200 mg by mouth at bedtime.      No current facility-administered medications for this visit.     Past Medical History:  Diagnosis Date  . Anemia   . Coronary atherosclerosis of native coronary artery    Previous PCI 2007-2008, Dr. Sharyon Cable  . Essential hypertension, benign   . Mixed hyperlipidemia   . PAD (peripheral artery disease) (HCC)     Absent right DP  . Palpitations   . Type 2 diabetes mellitus ([place])   . Vasomotor rhinitis     Past Surgical History:  Procedure Laterality Date  . ABDOMINAL HYSTERECTOMY    . CARDIAC CATHETERIZATION N/A 08/17/2015   Procedure: Left Heart Cath and Coronary Angiography;  Surgeon: Burnell Blanks, MD;  Location: [facility];  Service: Cardiovascular;  Laterality: N/A;  . CORONARY ARTERY BYPASS GRAFT  2006   [name], single vessel    Social History   Socioeconomic History  . Marital status: Married    Spouse name: Not on file  . Number of children: Not on file  . Years of education: Not on file  . Highest education level: Not on file  Occupational History  . Not on file  Social Needs  . Financial resource strain: Not on file  . Food insecurity    Worry: Not on file    Inability: Not on file  . Transportation needs    Medical: Not on file    Non-medical: Not on file  Tobacco Use  . Smoking status: Never Smoker  . Smokeless tobacco: Never Used  Substance and Sexual Activity  . Alcohol use: No    Alcohol/week: 0.0 standard drinks  . Drug use: No  . Sexual activity: Not on file  Lifestyle  . Physical activity    Days per week: Not on file    Minutes per  session: Not on file  . Stress: Not on file  Relationships  . Social Herbalist on phone: Not on file    Gets together: Not on file    Attends religious service: Not on file    Active member of club or organization: Not on file    Attends meetings of clubs or organizations: Not on file    Relationship status: Not on file  . Intimate partner violence    Fear of current or ex partner: Not on file    Emotionally abused: Not on file    Physically abused: Not on file    Forced sexual activity: Not on file  Other Topics Concern  . Not on file  Social History Narrative  . Not on file     Vitals:   05/18/19 0837  BP: 118/72  Pulse: 69  Temp: (!) 97.1 F (36.2 C)  SpO2: 98%  Weight: 195 lb (88.5 kg)  Height: 5\' 5"  (1.651 m)    Wt Readings from Last 3 Encounters:  05/18/19 195 lb (88.5 kg)  10/21/18 196 lb 9.6 oz (89.2 kg)  02/08/18 198 lb (89.8 kg)     PHYSICAL EXAM General: NAD HEENT: Normal. Neck: No JVD, no thyromegaly. Lungs: Clear to auscultation bilaterally with normal respiratory effort. CV: Regular rate and rhythm with premature contractions, normal S1/S2, no S3/S4, no murmur. No pretibial or periankle edema.  No carotid bruit.   Abdomen: Soft, nontender, no distention.  Neurologic: Alert and oriented.  Psych: Normal affect. Skin: Normal. Musculoskeletal: No gross deformities.      Labs: Lab Results  Component Value Date/Time   K 3.8 08/17/2015 03:54 AM   BUN 14 08/17/2015 03:54 AM   CREATININE 0.69 08/17/2015 03:54 AM   TSH 4.048 08/17/2015 03:54 AM   HGB 13.4 08/18/2015 03:12 AM     Lipids: Lab Results  Component Value Date/Time   LDLCALC 74 08/17/2015 04:12 AM   CHOL 136 08/17/2015 04:12 AM   TRIG 111 08/17/2015 04:12 AM   HDL 40 (L) 08/17/2015 04:12 AM      Relevant cardiovascular studies:  She underwent coronary angiography on 08/17/2015 which demonstrated the following:  1. Single vessel CAD with patent stent mid LAD with  mild restenosis. 2. Severe disease in a small caliber diagonal branch. The ostium of this branch is jailed by the LAD stent. The vessel is 1.5 mm and too small for PCI.  3. Mild non-obstructive disease in the RCA and  Circumflex artery.  4. Normal LV systolic function 5. 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  Nuclear stress test 09/15/17 showed no significant myocardial ischemia or scar. Blood pressure demonstrated a hypertensive response to exercise. Duke treadmill score was low, LVEF 58%.   ASSESSMENT AND PLAN:  1.  Coronary artery disease: She has a history of CABG with occlusion of the LIMA graft to the LAD.  Anginal symptoms are stable.  Low risk nuclear stress test on 09/15/2017.  Continue aspirin, atorvastatin, Imdur, and Bystolic.   2.  Hypertension: Blood pressure is normal.  No changes to therapy.  3.  Hyperlipidemia: On atorvastatin 40 mg.  LDL 69 on 02/02/2019.  4.  Palpitations: We talked about event monitoring but she is not interested in pursuing this.  I informed her about the possibility of the development of atrial fibrillation.  I also talked about potential anticoagulation strategies should she have or develop atrial fibrillation.  She defers management at this time.   Disposition: Follow up 6 months   Kate Sable, M.D., F.A.C.C.

## 2019-05-18 NOTE — Patient Instructions (Signed)

## 2019-06-03 DIAGNOSIS — B351 Tinea unguium: Secondary | ICD-10-CM | POA: Diagnosis not present

## 2019-06-03 DIAGNOSIS — M79676 Pain in unspecified toe(s): Secondary | ICD-10-CM | POA: Diagnosis not present

## 2019-06-03 DIAGNOSIS — L84 Corns and callosities: Secondary | ICD-10-CM | POA: Diagnosis not present

## 2019-06-03 DIAGNOSIS — E1142 Type 2 diabetes mellitus with diabetic polyneuropathy: Secondary | ICD-10-CM | POA: Diagnosis not present

## 2019-06-16 DIAGNOSIS — Z03818 Encounter for observation for suspected exposure to other biological agents ruled out: Secondary | ICD-10-CM | POA: Diagnosis not present

## 2019-06-16 DIAGNOSIS — Z20828 Contact with and (suspected) exposure to other viral communicable diseases: Secondary | ICD-10-CM | POA: Diagnosis not present

## 2019-07-01 ENCOUNTER — Other Ambulatory Visit: Payer: Self-pay | Admitting: Cardiovascular Disease

## 2019-08-08 ENCOUNTER — Other Ambulatory Visit: Payer: Self-pay | Admitting: Cardiovascular Disease

## 2019-08-12 DIAGNOSIS — B351 Tinea unguium: Secondary | ICD-10-CM | POA: Diagnosis not present

## 2019-08-12 DIAGNOSIS — M79676 Pain in unspecified toe(s): Secondary | ICD-10-CM | POA: Diagnosis not present

## 2019-08-12 DIAGNOSIS — L84 Corns and callosities: Secondary | ICD-10-CM | POA: Diagnosis not present

## 2019-08-12 DIAGNOSIS — E1142 Type 2 diabetes mellitus with diabetic polyneuropathy: Secondary | ICD-10-CM | POA: Diagnosis not present

## 2019-09-15 DIAGNOSIS — H43813 Vitreous degeneration, bilateral: Secondary | ICD-10-CM | POA: Diagnosis not present

## 2019-11-05 ENCOUNTER — Other Ambulatory Visit: Payer: Self-pay | Admitting: Cardiovascular Disease

## 2019-11-18 ENCOUNTER — Other Ambulatory Visit: Payer: Self-pay | Admitting: *Deleted

## 2019-11-18 MED ORDER — NITROGLYCERIN 0.4 MG/SPRAY TL SOLN: 1.0000 | 0 refills | Status: DC | PRN

## 2019-11-24 DIAGNOSIS — I209 Angina pectoris, unspecified: Secondary | ICD-10-CM | POA: Diagnosis not present

## 2019-11-24 DIAGNOSIS — Z299 Encounter for prophylactic measures, unspecified: Secondary | ICD-10-CM | POA: Diagnosis not present

## 2019-11-24 DIAGNOSIS — E1165 Type 2 diabetes mellitus with hyperglycemia: Secondary | ICD-10-CM | POA: Diagnosis not present

## 2019-11-24 DIAGNOSIS — I2510 Atherosclerotic heart disease of native coronary artery without angina pectoris: Secondary | ICD-10-CM | POA: Diagnosis not present

## 2019-11-24 DIAGNOSIS — Z789 Other specified health status: Secondary | ICD-10-CM | POA: Diagnosis not present

## 2019-11-24 DIAGNOSIS — I10 Essential (primary) hypertension: Secondary | ICD-10-CM | POA: Diagnosis not present

## 2019-11-29 ENCOUNTER — Telehealth: Payer: Self-pay | Admitting: *Deleted

## 2019-11-29 NOTE — Telephone Encounter (Signed)
Received fax from [facility] - Nitroglycerin spray approved 11/18/2019 through - until further notice.

## 2019-12-02 DIAGNOSIS — M79676 Pain in unspecified toe(s): Secondary | ICD-10-CM | POA: Diagnosis not present

## 2019-12-02 DIAGNOSIS — L84 Corns and callosities: Secondary | ICD-10-CM | POA: Diagnosis not present

## 2019-12-02 DIAGNOSIS — B351 Tinea unguium: Secondary | ICD-10-CM | POA: Diagnosis not present

## 2019-12-02 DIAGNOSIS — E1142 Type 2 diabetes mellitus with diabetic polyneuropathy: Secondary | ICD-10-CM | POA: Diagnosis not present

## 2019-12-27 ENCOUNTER — Other Ambulatory Visit: Payer: Self-pay | Admitting: Cardiovascular Disease

## 2020-01-02 ENCOUNTER — Encounter: Payer: Self-pay | Admitting: *Deleted

## 2020-01-02 NOTE — Progress Notes (Signed)
Cardiology Office Note  Date: 01/02/2020   ID: Kimberly, Flowers April 24, 1941, MRN [id number]  PCP:  Kimberly Chroman, MD  Cardiologist:  Kate Sable, MD Electrophysiologist:  None   Chief Complaint: Follow-up CAD, HTN, HLD, palpitations  History of Present Illness: Kimberly Flowers is a 79 y.o. female with a history of CAD, HTN, HLD, palpitations.  Last encounter with Dr. Bronson Ing on 05/18/2019.  Status post history of CABG with LIMA to LAD, anginal symptoms were stable.  She had a low risk stress test on January 2019  She was to continue aspirin, atorvastatin, Imdur, and diastolic.  She was normotensive and there were no changes to therapy.  Her cholesterol was at goal on May 2020.  She has been experiencing palpitations.  An event monitor was offered but she deferred any further studies at that time.  She was informed of the potential for development of A. Fib.  Patient presents today without any significant complaints.  She states she does have occasional palpitations that are not particularly bothersome.  She states after starting the Imdur the palpitations have reduced in frequency and associated chest pain intensity.  She denies any recent acute illnesses, hospitalizations.  States she has some occasional lower extremity edema.  Denies any significant anginal or exertional symptoms, orthostatic symptoms, claudication-like symptoms.  States she has occasional mild lower extremity edema mostly in her right leg.  Blood pressure was slightly elevated on arrival today at 148/70.  Recheck in left arm was 122/70.    Past Medical History:  Diagnosis Date  . Anemia   . Coronary atherosclerosis of native coronary artery    Previous PCI 2007-2008, Dr. Sharyon Cable  . Essential hypertension, benign   . Mixed hyperlipidemia   . PAD (peripheral artery disease) (HCC)     Absent right DP  . Palpitations   . Type 2 diabetes mellitus ([place])   . Vasomotor rhinitis     Past Surgical History:    Procedure Laterality Date  . ABDOMINAL HYSTERECTOMY    . CARDIAC CATHETERIZATION N/A 08/17/2015   Procedure: Left Heart Cath and Coronary Angiography;  Surgeon: Burnell Blanks, MD;  Location: [facility];  Service: Cardiovascular;  Laterality: N/A;  . CORONARY ARTERY BYPASS GRAFT  2006   [name], single vessel    Current Outpatient Medications  Medication Sig Dispense Refill  . acetaminophen (TYLENOL) 325 MG tablet Take 2 tablets (650 mg total) by mouth every 4 (four) hours as needed for headache or mild pain.    Marland Kitchen amLODipine (NORVASC) 10 MG tablet Take 1 tablet by mouth once daily 90 tablet 0  . Ascorbic Acid (VITAMIN C) 1000 MG tablet Take 1,000 mg by mouth 2 (two) times daily.    Marland Kitchen aspirin 81 MG EC tablet Take 81 mg by mouth daily.    Marland Kitchen atorvastatin (LIPITOR) 40 MG tablet Take 1 tablet by mouth once daily 90 tablet 0  . Calcium Carbonate-Vitamin D 600-400 MG-UNIT per tablet Take 1 tablet by mouth daily.     . Cholecalciferol (VITAMIN D3) 5000 UNITS CAPS Take 5,000 Units by mouth daily.     Marland Kitchen docusate sodium (COLACE) 100 MG capsule Take 100 mg by mouth daily as needed for mild constipation or moderate constipation.     . Esomeprazole Magnesium (NEXIUM PO) Take 2 capsules by mouth daily.     . ferrous gluconate (FERGON) 324 MG tablet Take 324 mg by mouth at bedtime.     . fexofenadine (ALLEGRA)  180 MG tablet Take 180 mg by mouth daily.    Marland Kitchen glucosamine-chondroitin 500-400 MG tablet Take 1 tablet by mouth 2 (two) times daily.     . isosorbide mononitrate (IMDUR) 30 MG 24 hr tablet Take 1 tablet by mouth once daily 90 tablet 1  . losartan (COZAAR) 50 MG tablet Take 50 mg by mouth 2 (two) times daily.    . metformin (FORTAMET) 500 MG (OSM) 24 hr tablet Take 1 tablet (500 mg total) by mouth at bedtime.    . Multiple Vitamin (MULTIVITAMIN) tablet Take 1 tablet by mouth daily.    . nebivolol (BYSTOLIC) 5 MG tablet Take 1 tablet (5 mg total) by mouth daily. 30 tablet 6  .  nitroGLYCERIN (NITROLINGUAL) 0.4 MG/SPRAY spray Place 1 spray under the tongue every 5 (five) minutes x 3 doses as needed for chest pain (if no relief after 3rd dose, proceed to the ED for an evaluation). 4.9 g 0  . Omega-3 Fatty Acids (FISH OIL) 1200 MG CAPS Take 1,200 mg by mouth at bedtime.      No current facility-administered medications for this visit.   Allergies:  Sulfonamide derivatives   Social History: The patient  reports that she has never smoked. She has never used smokeless tobacco. She reports that she does not drink alcohol or use drugs.   Family History: The patient's family history includes CAD in an other family member; Cancer in her sister.   ROS:  Please see the history of present illness. Otherwise, complete review of systems is positive for none.  All other systems are reviewed and negative.   Physical Exam: VS:  There were no vitals taken for this visit., BMI There is no height or weight on file to calculate BMI.  Wt Readings from Last 3 Encounters:  05/18/19 195 lb (88.5 kg)  10/21/18 196 lb 9.6 oz (89.2 kg)  02/08/18 198 lb (89.8 kg)    General: Patient appears comfortable at rest. Neck: Supple, no elevated JVP or carotid bruits, no thyromegaly. Lungs: Clear to auscultation, nonlabored breathing at rest. Cardiac: Regular rate and rhythm, no S3 or significant systolic murmur, no pericardial rub. Extremities: No pitting edema, distal pulses 2+. Skin: Warm and dry. Musculoskeletal: No kyphosis. Neuropsychiatric: Alert and oriented x3, affect grossly appropriate.  ECG:  An ECG dated 01/03/2020. was personally reviewed today and demonstrated:  Sinus rhythm with marked sinus arrhythmia, left axis deviation, heart rate of 68  Recent Labwork: No results found for requested labs within last 8760 hours.     Component Value Date/Time   CHOL 136 08/17/2015 0412   TRIG 111 08/17/2015 0412   HDL 40 (L) 08/17/2015 0412   CHOLHDL 3.4 08/17/2015 0412   VLDL 22  08/17/2015 0412   LDLCALC 74 08/17/2015 0412    Other Studies Reviewed Today: She underwent coronary angiography on 08/17/2015 which demonstrated the following:  1. Single vessel CAD with patent stent mid LAD with mild restenosis. 2. Severe disease in a small caliber diagonal branch. The ostium of this branch is jailed by the LAD stent. The vessel is 1.5 mm and too small for PCI.  3. Mild non-obstructive disease in the RCA and Circumflex artery.  4. Normal LV systolic function 5. 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  Nuclear stress test 09/15/17 showed no  significant myocardial ischemia or scar. Blood pressure demonstrated a hypertensive response to exercise. Duke treadmill score was low, LVEF 58%.   Assessment and Plan:  1. CAD in native artery   2. Essential hypertension   3. Palpitations   4. Mixed hyperlipidemia    1. CAD in native artery Denies any recent progressive anginal or exertional symptoms.  Continue aspirin 81 mg, Imdur 30 mg, sublingual nitroglycerin as needed, Bystolic 5 mg daily.  2. Essential hypertension Blood pressure initially elevated on arrival at 148/70.  Recheck in left arm was 122/70.  Continue amlodipine 10 mg daily, losartan 50 mg daily, Bystolic 5 mg daily.  3. Palpitations Patient states her palpitations have significantly decreased since starting Imdur.  States that she has occasional palpitations which have no particular pattern of frequency or intensity.  She denies any presyncope or syncopal episodes associated.  States before starting the Imdur the palpitations were associated with some chest pain.  4. Mixed hyperlipidemia Last labs by PCP showed lipid profile; TC 140, TG 137, HDL 44, LDL 69.  Continue atorvastatin 40 mg  daily.  Medication Adjustments/Labs and Tests Ordered: Current medicines are reviewed at length with the patient today.  Concerns regarding medicines are outlined above.   Disposition: Follow-up with Dr. Bronson Ing or APP 1 year  Signed, Levell July, NP 01/02/2020 10:44 PM    [facility], [place], [place] [address] Phone: [phone]; Fax: [phone]

## 2020-01-03 ENCOUNTER — Ambulatory Visit (INDEPENDENT_AMBULATORY_CARE_PROVIDER_SITE_OTHER): Payer: Medicare Other | Admitting: Family Medicine

## 2020-01-03 ENCOUNTER — Encounter: Payer: Self-pay | Admitting: Family Medicine

## 2020-01-03 ENCOUNTER — Other Ambulatory Visit: Payer: Self-pay

## 2020-01-03 VITALS — BP 122/70 | HR 70 | Ht 65.0 in | Wt 201.0 lb

## 2020-01-03 DIAGNOSIS — E782 Mixed hyperlipidemia: Secondary | ICD-10-CM

## 2020-01-03 DIAGNOSIS — I2510 Atherosclerotic heart disease of native coronary artery without angina pectoris: Secondary | ICD-10-CM

## 2020-01-03 DIAGNOSIS — I10 Essential (primary) hypertension: Secondary | ICD-10-CM | POA: Diagnosis not present

## 2020-01-03 DIAGNOSIS — R002 Palpitations: Secondary | ICD-10-CM

## 2020-01-03 NOTE — Patient Instructions (Addendum)

## 2020-02-02 ENCOUNTER — Other Ambulatory Visit: Payer: Self-pay | Admitting: Cardiovascular Disease

## 2020-02-09 DIAGNOSIS — Z1339 Encounter for screening examination for other mental health and behavioral disorders: Secondary | ICD-10-CM | POA: Diagnosis not present

## 2020-02-09 DIAGNOSIS — Z1331 Encounter for screening for depression: Secondary | ICD-10-CM | POA: Diagnosis not present

## 2020-02-09 DIAGNOSIS — E1165 Type 2 diabetes mellitus with hyperglycemia: Secondary | ICD-10-CM | POA: Diagnosis not present

## 2020-02-09 DIAGNOSIS — Z7189 Other specified counseling: Secondary | ICD-10-CM | POA: Diagnosis not present

## 2020-02-09 DIAGNOSIS — R5383 Other fatigue: Secondary | ICD-10-CM | POA: Diagnosis not present

## 2020-02-09 DIAGNOSIS — I10 Essential (primary) hypertension: Secondary | ICD-10-CM | POA: Diagnosis not present

## 2020-02-09 DIAGNOSIS — Z299 Encounter for prophylactic measures, unspecified: Secondary | ICD-10-CM | POA: Diagnosis not present

## 2020-02-09 DIAGNOSIS — E7800 Pure hypercholesterolemia, unspecified: Secondary | ICD-10-CM | POA: Diagnosis not present

## 2020-02-09 DIAGNOSIS — Z0000 Encounter for general adult medical examination without abnormal findings: Secondary | ICD-10-CM | POA: Diagnosis not present

## 2020-02-09 DIAGNOSIS — Z1211 Encounter for screening for malignant neoplasm of colon: Secondary | ICD-10-CM | POA: Diagnosis not present

## 2020-02-09 DIAGNOSIS — Z6833 Body mass index (BMI) 33.0-33.9, adult: Secondary | ICD-10-CM | POA: Diagnosis not present

## 2020-02-21 DIAGNOSIS — Z79899 Other long term (current) drug therapy: Secondary | ICD-10-CM | POA: Diagnosis not present

## 2020-02-21 DIAGNOSIS — M859 Disorder of bone density and structure, unspecified: Secondary | ICD-10-CM | POA: Diagnosis not present

## 2020-02-21 DIAGNOSIS — E2839 Other primary ovarian failure: Secondary | ICD-10-CM | POA: Diagnosis not present

## 2020-03-07 DIAGNOSIS — I10 Essential (primary) hypertension: Secondary | ICD-10-CM | POA: Diagnosis not present

## 2020-03-07 DIAGNOSIS — E785 Hyperlipidemia, unspecified: Secondary | ICD-10-CM | POA: Diagnosis not present

## 2020-03-22 DIAGNOSIS — I2510 Atherosclerotic heart disease of native coronary artery without angina pectoris: Secondary | ICD-10-CM | POA: Diagnosis not present

## 2020-03-22 DIAGNOSIS — Z299 Encounter for prophylactic measures, unspecified: Secondary | ICD-10-CM | POA: Diagnosis not present

## 2020-03-22 DIAGNOSIS — I10 Essential (primary) hypertension: Secondary | ICD-10-CM | POA: Diagnosis not present

## 2020-03-22 DIAGNOSIS — E1165 Type 2 diabetes mellitus with hyperglycemia: Secondary | ICD-10-CM | POA: Diagnosis not present

## 2020-03-22 DIAGNOSIS — Z713 Dietary counseling and surveillance: Secondary | ICD-10-CM | POA: Diagnosis not present

## 2020-04-11 DIAGNOSIS — Z85020 Personal history of malignant carcinoid tumor of stomach: Secondary | ICD-10-CM | POA: Diagnosis not present

## 2020-04-11 DIAGNOSIS — K449 Diaphragmatic hernia without obstruction or gangrene: Secondary | ICD-10-CM | POA: Diagnosis not present

## 2020-04-11 DIAGNOSIS — R195 Other fecal abnormalities: Secondary | ICD-10-CM | POA: Diagnosis not present

## 2020-04-13 DIAGNOSIS — D131 Benign neoplasm of stomach: Secondary | ICD-10-CM | POA: Diagnosis not present

## 2020-04-13 DIAGNOSIS — K2960 Other gastritis without bleeding: Secondary | ICD-10-CM | POA: Diagnosis not present

## 2020-04-13 DIAGNOSIS — Z79899 Other long term (current) drug therapy: Secondary | ICD-10-CM | POA: Diagnosis not present

## 2020-04-13 DIAGNOSIS — K317 Polyp of stomach and duodenum: Secondary | ICD-10-CM | POA: Diagnosis not present

## 2020-04-13 DIAGNOSIS — Z1211 Encounter for screening for malignant neoplasm of colon: Secondary | ICD-10-CM | POA: Diagnosis not present

## 2020-04-13 DIAGNOSIS — Z7982 Long term (current) use of aspirin: Secondary | ICD-10-CM | POA: Diagnosis not present

## 2020-04-13 DIAGNOSIS — R195 Other fecal abnormalities: Secondary | ICD-10-CM | POA: Diagnosis not present

## 2020-04-13 DIAGNOSIS — Z882 Allergy status to sulfonamides status: Secondary | ICD-10-CM | POA: Diagnosis not present

## 2020-04-13 DIAGNOSIS — Z86010 Personal history of colonic polyps: Secondary | ICD-10-CM | POA: Diagnosis not present

## 2020-04-13 DIAGNOSIS — Z8719 Personal history of other diseases of the digestive system: Secondary | ICD-10-CM | POA: Diagnosis not present

## 2020-04-13 DIAGNOSIS — E119 Type 2 diabetes mellitus without complications: Secondary | ICD-10-CM | POA: Diagnosis not present

## 2020-04-13 DIAGNOSIS — K2970 Gastritis, unspecified, without bleeding: Secondary | ICD-10-CM | POA: Diagnosis not present

## 2020-04-13 DIAGNOSIS — K2930 Chronic superficial gastritis without bleeding: Secondary | ICD-10-CM | POA: Diagnosis not present

## 2020-04-13 DIAGNOSIS — K449 Diaphragmatic hernia without obstruction or gangrene: Secondary | ICD-10-CM | POA: Diagnosis not present

## 2020-04-13 DIAGNOSIS — I4891 Unspecified atrial fibrillation: Secondary | ICD-10-CM | POA: Diagnosis not present

## 2020-05-07 DIAGNOSIS — D131 Benign neoplasm of stomach: Secondary | ICD-10-CM | POA: Diagnosis not present

## 2020-05-08 DIAGNOSIS — I10 Essential (primary) hypertension: Secondary | ICD-10-CM | POA: Diagnosis not present

## 2020-05-08 DIAGNOSIS — E785 Hyperlipidemia, unspecified: Secondary | ICD-10-CM | POA: Diagnosis not present

## 2020-06-07 DIAGNOSIS — I10 Essential (primary) hypertension: Secondary | ICD-10-CM | POA: Diagnosis not present

## 2020-06-07 DIAGNOSIS — E785 Hyperlipidemia, unspecified: Secondary | ICD-10-CM | POA: Diagnosis not present

## 2020-06-29 DIAGNOSIS — L84 Corns and callosities: Secondary | ICD-10-CM | POA: Diagnosis not present

## 2020-06-29 DIAGNOSIS — B351 Tinea unguium: Secondary | ICD-10-CM | POA: Diagnosis not present

## 2020-06-29 DIAGNOSIS — M79676 Pain in unspecified toe(s): Secondary | ICD-10-CM | POA: Diagnosis not present

## 2020-06-29 DIAGNOSIS — E1142 Type 2 diabetes mellitus with diabetic polyneuropathy: Secondary | ICD-10-CM | POA: Diagnosis not present

## 2020-07-02 ENCOUNTER — Other Ambulatory Visit: Payer: Self-pay | Admitting: *Deleted

## 2020-07-02 MED ORDER — ISOSORBIDE MONONITRATE ER 30 MG PO TB24: 30.0000 mg | ORAL_TABLET | Freq: Every day | ORAL | 1 refills | Status: DC

## 2020-07-03 DIAGNOSIS — Z299 Encounter for prophylactic measures, unspecified: Secondary | ICD-10-CM | POA: Diagnosis not present

## 2020-07-03 DIAGNOSIS — I2510 Atherosclerotic heart disease of native coronary artery without angina pectoris: Secondary | ICD-10-CM | POA: Diagnosis not present

## 2020-07-03 DIAGNOSIS — I10 Essential (primary) hypertension: Secondary | ICD-10-CM | POA: Diagnosis not present

## 2020-07-03 DIAGNOSIS — E1165 Type 2 diabetes mellitus with hyperglycemia: Secondary | ICD-10-CM | POA: Diagnosis not present

## 2020-07-04 ENCOUNTER — Encounter: Payer: Self-pay | Admitting: *Deleted

## 2020-07-05 ENCOUNTER — Ambulatory Visit (INDEPENDENT_AMBULATORY_CARE_PROVIDER_SITE_OTHER): Payer: Medicare Other | Admitting: Cardiology

## 2020-07-05 ENCOUNTER — Encounter: Payer: Self-pay | Admitting: Cardiology

## 2020-07-05 ENCOUNTER — Telehealth: Payer: Self-pay | Admitting: Cardiology

## 2020-07-05 ENCOUNTER — Other Ambulatory Visit: Payer: Self-pay | Admitting: Cardiology

## 2020-07-05 VITALS — BP 148/78 | HR 60 | Ht 65.5 in | Wt 191.0 lb

## 2020-07-05 DIAGNOSIS — R0789 Other chest pain: Secondary | ICD-10-CM

## 2020-07-05 DIAGNOSIS — I25119 Atherosclerotic heart disease of native coronary artery with unspecified angina pectoris: Secondary | ICD-10-CM

## 2020-07-05 DIAGNOSIS — I2510 Atherosclerotic heart disease of native coronary artery without angina pectoris: Secondary | ICD-10-CM

## 2020-07-05 MED ORDER — ISOSORBIDE MONONITRATE ER 60 MG PO TB24: 60.0000 mg | ORAL_TABLET | Freq: Every day | ORAL | 1 refills | Status: DC

## 2020-07-05 MED ORDER — SODIUM CHLORIDE 0.9% FLUSH: 3.0000 mL | Freq: Two times a day (BID) | INTRAVENOUS | Status: DC

## 2020-07-05 NOTE — Progress Notes (Signed)
Clinical Summary Kimberly Flowers is a 79 y.o.female seen today for follow up of the following medical problems.    1. CAD - history of prior CABG, subsequent occlusion of LIMA-LAD graft, last cath showed patent LAD stent.  Low risk nuclear stress test on 1/8/201  - from pcp note has had some recent chest pain - episodes 2-3 times a week - sharp/pressure midchest, bilatearl jaw pain. Episode after some light house work. 12/10 in severity. +SOB. 20 min after 2nd NG spray symptoms resolved. Mild symptoms  - walks daily x 20-30 minutes, no exertional symptoms  - similar to prior angina she had at time of prior interventions.  - compliant with meds.    2. HTN - just took meds - 130/80 at pcp office recently.      Past Medical History:  Diagnosis Date  . Anemia   . Coronary atherosclerosis of native coronary artery    Previous PCI 2007-2008, Dr. Sharyon Cable  . Essential hypertension, benign   . Mixed hyperlipidemia   . PAD (peripheral artery disease) (HCC)     Absent right DP  . Palpitations   . Type 2 diabetes mellitus ([place])   . Vasomotor rhinitis      Allergies  Allergen Reactions  . Sulfonamide Derivatives     REACTION: Unknown     Current Outpatient Medications  Medication Sig Dispense Refill  . acetaminophen (TYLENOL) 325 MG tablet Take 2 tablets (650 mg total) by mouth every 4 (four) hours as needed for headache or mild pain.    Marland Kitchen amLODipine (NORVASC) 10 MG tablet TAKE 1 TABLET BY MOUTH ONCE DAILY NEEDS  OFFICE  VISIT 90 tablet 1  . Ascorbic Acid (VITAMIN C) 1000 MG tablet Take 1,000 mg by mouth 2 (two) times daily.    Marland Kitchen aspirin 81 MG EC tablet Take 81 mg by mouth daily.    Marland Kitchen atorvastatin (LIPITOR) 40 MG tablet TAKE 1 TABLET BY MOUTH ONCE DAILY NEEDS  OFFICE  VISIT 90 tablet 1  . Calcium Carbonate-Vitamin D 600-400 MG-UNIT per tablet Take 1 tablet by mouth daily.     . Cholecalciferol (VITAMIN D3) 5000 UNITS CAPS Take 5,000 Units by mouth daily.     Marland Kitchen docusate  sodium (COLACE) 100 MG capsule Take 100 mg by mouth daily as needed for mild constipation or moderate constipation.     . Esomeprazole Magnesium (NEXIUM PO) Take 2 capsules by mouth daily.     . ferrous gluconate (FERGON) 324 MG tablet Take 324 mg by mouth at bedtime.     . fexofenadine (ALLEGRA) 180 MG tablet Take 180 mg by mouth daily.    Marland Kitchen glucosamine-chondroitin 500-400 MG tablet Take 1 tablet by mouth 2 (two) times daily.     . isosorbide mononitrate (IMDUR) 30 MG 24 hr tablet Take 1 tablet (30 mg total) by mouth daily. 90 tablet 1  . losartan (COZAAR) 50 MG tablet Take 50 mg by mouth 2 (two) times daily.    . metformin (FORTAMET) 500 MG (OSM) 24 hr tablet Take 1 tablet (500 mg total) by mouth at bedtime.    . Multiple Vitamin (MULTIVITAMIN) tablet Take 1 tablet by mouth daily.    . nebivolol (BYSTOLIC) 5 MG tablet Take 1 tablet (5 mg total) by mouth daily. 30 tablet 6  . nitroGLYCERIN (NITROLINGUAL) 0.4 MG/SPRAY spray Place 1 spray under the tongue every 5 (five) minutes x 3 doses as needed for chest pain (if no relief after 3rd dose, proceed  to the ED for an evaluation). 4.9 g 0  . Omega-3 Fatty Acids (FISH OIL) 1200 MG CAPS Take 1,200 mg by mouth at bedtime.      No current facility-administered medications for this visit.     Past Surgical History:  Procedure Laterality Date  . ABDOMINAL HYSTERECTOMY    . CARDIAC CATHETERIZATION N/A 08/17/2015   Procedure: Left Heart Cath and Coronary Angiography;  Surgeon: Burnell Blanks, MD;  Location: [facility];  Service: Cardiovascular;  Laterality: N/A;  . CORONARY ARTERY BYPASS GRAFT  2006   [name], single vessel     Allergies  Allergen Reactions  . Sulfonamide Derivatives     REACTION: Unknown      Family History  Problem Relation Age of Onset  . Cancer Sister        Breast  . CAD Other        Family history     Social History Kimberly Flowers reports that she has never smoked. She has never used smokeless  tobacco. Kimberly Flowers reports no history of alcohol use.   Review of Systems CONSTITUTIONAL: No weight loss, fever, chills, weakness or fatigue.  HEENT: Eyes: No visual loss, blurred vision, double vision or yellow sclerae.No hearing loss, sneezing, congestion, runny nose or sore throat.  SKIN: No rash or itching.  CARDIOVASCULAR: per hpi RESPIRATORY: No shortness of breath, cough or sputum.  GASTROINTESTINAL: No anorexia, nausea, vomiting or diarrhea. No abdominal pain or blood.  GENITOURINARY: No burning on urination, no polyuria NEUROLOGICAL: No headache, dizziness, syncope, paralysis, ataxia, numbness or tingling in the extremities. No change in bowel or bladder control.  MUSCULOSKELETAL: No muscle, back pain, joint pain or stiffness.  LYMPHATICS: No enlarged nodes. No history of splenectomy.  PSYCHIATRIC: No history of depression or anxiety.  ENDOCRINOLOGIC: No reports of sweating, cold or heat intolerance. No polyuria or polydipsia.  Marland Kitchen   Physical Examination Today's Vitals   07/05/20 0907  BP: (!) 148/78  Pulse: 60  SpO2: 94%  Weight: 191 lb (86.6 kg)  Height: 5' 5.5" (1.664 m)   Body mass index is 31.3 kg/m.  Gen: resting comfortably, no acute distress HEENT: no scleral icterus, pupils equal round and reactive, no palptable cervical adenopathy,  CV: RRR, no m/r/g, no jvd Resp: Clear to auscultation bilaterally GI: abdomen is soft, non-tender, non-distended, normal bowel sounds, no hepatosplenomegaly MSK: extremities are warm, no edema.  Skin: warm, no rash Neuro:  no focal deficits Psych: appropriate affect   Diagnostic Studies  She underwent coronary angiography on 08/17/2015 which demonstrated the following:  1. Single vessel CAD with patent stent mid LAD with mild restenosis. 2. Severe disease in a small caliber diagonal Kimberly Flowers. The ostium of this Kimberly Flowers is jailed by the LAD stent. The vessel is 1.5 mm and too small for PCI.  3. Mild non-obstructive disease  in the RCA and Circumflex artery.  4. Normal LV systolic function 5. 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  Nuclear stress test 09/15/17 showed no significant myocardial ischemia or scar. Blood pressure demonstrated a hypertensive response to exercise. Duke treadmill score was low, LVEF 58%.   Assessment and Plan  1. CAD/Chest pain - recent chest pain symptoms similar to her prior angina, increasing in severity and frequency with most severe episode this past Saturday -given  her prior history and progressing symptoms best evaluation would be to proceed with cath, we will arrange - on multiple antiangials already, we will increase her imdur to 60mg  daily - discussed if significant symptoms prior to cath to come in to ER EKG today SR, no acute ischemic changes   I have reviewed the risks, indications, and alternatives to cardiac catheterization, possible angioplasty, and stenting with the patient  today. Risks include but are not limited to bleeding, infection, vascular injury, stroke, myocardial infection, arrhythmia, kidney injury, radiation-related injury in the case of prolonged fluoroscopy use, emergency cardiac surgery, and death. The patient understands the risks of serious complication is 1-2 in 7218 with diagnostic cardiac cath and 1-2% or less with angioplasty/stenting.       Arnoldo Lenis, M.D

## 2020-07-05 NOTE — H&P (View-Only) (Signed)
Clinical Summary Kimberly Flowers is a 79 y.o.female seen today for follow up of the following medical problems.    1. CAD - history of prior CABG, subsequent occlusion of LIMA-LAD graft, last cath showed patent LAD stent.  Low risk nuclear stress test on 1/8/201  - from pcp note has had some recent chest pain - episodes 2-3 times a week - sharp/pressure midchest, bilatearl jaw pain. Episode after some light house work. 12/10 in severity. +SOB. 20 min after 2nd NG spray symptoms resolved. Mild symptoms  - walks daily x 20-30 minutes, no exertional symptoms  - similar to prior angina she had at time of prior interventions.  - compliant with meds.    2. HTN - just took meds - 130/80 at pcp office recently.      Past Medical History:  Diagnosis Date  . Anemia   . Coronary atherosclerosis of native coronary artery    Previous PCI 2007-2008, Dr. Sharyon Cable  . Essential hypertension, benign   . Mixed hyperlipidemia   . PAD (peripheral artery disease) (HCC)     Absent right DP  . Palpitations   . Type 2 diabetes mellitus ([place])   . Vasomotor rhinitis      Allergies  Allergen Reactions  . Sulfonamide Derivatives     REACTION: Unknown     Current Outpatient Medications  Medication Sig Dispense Refill  . acetaminophen (TYLENOL) 325 MG tablet Take 2 tablets (650 mg total) by mouth every 4 (four) hours as needed for headache or mild pain.    Marland Kitchen amLODipine (NORVASC) 10 MG tablet TAKE 1 TABLET BY MOUTH ONCE DAILY NEEDS  OFFICE  VISIT 90 tablet 1  . Ascorbic Acid (VITAMIN C) 1000 MG tablet Take 1,000 mg by mouth 2 (two) times daily.    Marland Kitchen aspirin 81 MG EC tablet Take 81 mg by mouth daily.    Marland Kitchen atorvastatin (LIPITOR) 40 MG tablet TAKE 1 TABLET BY MOUTH ONCE DAILY NEEDS  OFFICE  VISIT 90 tablet 1  . Calcium Carbonate-Vitamin D 600-400 MG-UNIT per tablet Take 1 tablet by mouth daily.     . Cholecalciferol (VITAMIN D3) 5000 UNITS CAPS Take 5,000 Units by mouth daily.     Marland Kitchen docusate  sodium (COLACE) 100 MG capsule Take 100 mg by mouth daily as needed for mild constipation or moderate constipation.     . Esomeprazole Magnesium (NEXIUM PO) Take 2 capsules by mouth daily.     . ferrous gluconate (FERGON) 324 MG tablet Take 324 mg by mouth at bedtime.     . fexofenadine (ALLEGRA) 180 MG tablet Take 180 mg by mouth daily.    Marland Kitchen glucosamine-chondroitin 500-400 MG tablet Take 1 tablet by mouth 2 (two) times daily.     . isosorbide mononitrate (IMDUR) 30 MG 24 hr tablet Take 1 tablet (30 mg total) by mouth daily. 90 tablet 1  . losartan (COZAAR) 50 MG tablet Take 50 mg by mouth 2 (two) times daily.    . metformin (FORTAMET) 500 MG (OSM) 24 hr tablet Take 1 tablet (500 mg total) by mouth at bedtime.    . Multiple Vitamin (MULTIVITAMIN) tablet Take 1 tablet by mouth daily.    . nebivolol (BYSTOLIC) 5 MG tablet Take 1 tablet (5 mg total) by mouth daily. 30 tablet 6  . nitroGLYCERIN (NITROLINGUAL) 0.4 MG/SPRAY spray Place 1 spray under the tongue every 5 (five) minutes x 3 doses as needed for chest pain (if no relief after 3rd dose, proceed  to the ED for an evaluation). 4.9 g 0  . Omega-3 Fatty Acids (FISH OIL) 1200 MG CAPS Take 1,200 mg by mouth at bedtime.      No current facility-administered medications for this visit.     Past Surgical History:  Procedure Laterality Date  . ABDOMINAL HYSTERECTOMY    . CARDIAC CATHETERIZATION N/A 08/17/2015   Procedure: Left Heart Cath and Coronary Angiography;  Surgeon: Burnell Blanks, MD;  Location: [facility];  Service: Cardiovascular;  Laterality: N/A;  . CORONARY ARTERY BYPASS GRAFT  2006   [name], single vessel     Allergies  Allergen Reactions  . Sulfonamide Derivatives     REACTION: Unknown      Family History  Problem Relation Age of Onset  . Cancer Sister        Breast  . CAD Other        Family history     Social History Ms. Boord reports that she has never smoked. She has never used smokeless  tobacco. Ms. Witthuhn reports no history of alcohol use.   Review of Systems CONSTITUTIONAL: No weight loss, fever, chills, weakness or fatigue.  HEENT: Eyes: No visual loss, blurred vision, double vision or yellow sclerae.No hearing loss, sneezing, congestion, runny nose or sore throat.  SKIN: No rash or itching.  CARDIOVASCULAR: per hpi RESPIRATORY: No shortness of breath, cough or sputum.  GASTROINTESTINAL: No anorexia, nausea, vomiting or diarrhea. No abdominal pain or blood.  GENITOURINARY: No burning on urination, no polyuria NEUROLOGICAL: No headache, dizziness, syncope, paralysis, ataxia, numbness or tingling in the extremities. No change in bowel or bladder control.  MUSCULOSKELETAL: No muscle, back pain, joint pain or stiffness.  LYMPHATICS: No enlarged nodes. No history of splenectomy.  PSYCHIATRIC: No history of depression or anxiety.  ENDOCRINOLOGIC: No reports of sweating, cold or heat intolerance. No polyuria or polydipsia.  Marland Kitchen   Physical Examination Today's Vitals   07/05/20 0907  BP: (!) 148/78  Pulse: 60  SpO2: 94%  Weight: 191 lb (86.6 kg)  Height: 5' 5.5" (1.664 m)   Body mass index is 31.3 kg/m.  Gen: resting comfortably, no acute distress HEENT: no scleral icterus, pupils equal round and reactive, no palptable cervical adenopathy,  CV: RRR, no m/r/g, no jvd Resp: Clear to auscultation bilaterally GI: abdomen is soft, non-tender, non-distended, normal bowel sounds, no hepatosplenomegaly MSK: extremities are warm, no edema.  Skin: warm, no rash Neuro:  no focal deficits Psych: appropriate affect   Diagnostic Studies  She underwent coronary angiography on 08/17/2015 which demonstrated the following:  1. Single vessel CAD with patent stent mid LAD with mild restenosis. 2. Severe disease in a small caliber diagonal Graeson Nouri. The ostium of this Takila Kronberg is jailed by the LAD stent. The vessel is 1.5 mm and too small for PCI.  3. Mild non-obstructive disease  in the RCA and Circumflex artery.  4. Normal LV systolic function 5. 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  Nuclear stress test 09/15/17 showed no significant myocardial ischemia or scar. Blood pressure demonstrated a hypertensive response to exercise. Duke treadmill score was low, LVEF 58%.   Assessment and Plan  1. CAD/Chest pain - recent chest pain symptoms similar to her prior angina, increasing in severity and frequency with most severe episode this past Saturday -given  her prior history and progressing symptoms best evaluation would be to proceed with cath, we will arrange - on multiple antiangials already, we will increase her imdur to 60mg  daily - discussed if significant symptoms prior to cath to come in to ER EKG today SR, no acute ischemic changes   I have reviewed the risks, indications, and alternatives to cardiac catheterization, possible angioplasty, and stenting with the patient  today. Risks include but are not limited to bleeding, infection, vascular injury, stroke, myocardial infection, arrhythmia, kidney injury, radiation-related injury in the case of prolonged fluoroscopy use, emergency cardiac surgery, and death. The patient understands the risks of serious complication is 1-2 in 2241 with diagnostic cardiac cath and 1-2% or less with angioplasty/stenting.       Arnoldo Lenis, M.D

## 2020-07-05 NOTE — Patient Instructions (Addendum)
Your physician recommends that you schedule a follow-up appointment in: [place] [facility]   Your physician has recommended you make the following change in your medication:   [address] [place] [address] Dept: [phone] Loc: [place]  07/05/2020  You are scheduled for a Cardiac Catheterization on Tuesday, November 2 with Dr. Daneen Schick.  1. Please arrive at the [facility] (Main Entrance A) at [facility]: [address] [place], [place] [address] at 5:30 AM (This time is two hours before your procedure to ensure your preparation). Free valet parking service is available.   Special note: Every effort is made to have your procedure done on time. Please understand that emergencies sometimes delay scheduled procedures.  2. Diet: Do not eat solid foods after midnight.  The patient may have clear liquids until 5am upon the day of the procedure.  3. Labs: You will need to have blood drawn on Monday, November 1 at [place] 8:55AM AT [place]   4. Medication instructions in preparation for your procedure:  HOLD DIABETIC MEDICATIONS THE DAY BEFORE AND 48 HOURS AFTER YOUR PROCEDURE   On the morning of your procedure, take your Aspirin and any morning medicines NOT listed above.  You may use sips of water.  5. Plan for one night stay--bring personal belongings. 6. Bring a current list of your medications and current insurance cards. 7. You MUST have a responsible person to drive you home. 8. Someone MUST be with you the first 24 hours after you arrive home or your discharge will be delayed. 9. Please wear clothes that are easy to get on and off and wear slip-on shoes.  Thank you for allowing [place] to care for you!   -- [facility]

## 2020-07-05 NOTE — Telephone Encounter (Signed)
Pre-cert Verification for the following procedure     [facility] 11/2 @ [place] Tamala Julian

## 2020-07-06 DIAGNOSIS — E785 Hyperlipidemia, unspecified: Secondary | ICD-10-CM | POA: Diagnosis not present

## 2020-07-06 DIAGNOSIS — I10 Essential (primary) hypertension: Secondary | ICD-10-CM | POA: Diagnosis not present

## 2020-07-09 ENCOUNTER — Telehealth: Payer: Self-pay | Admitting: *Deleted

## 2020-07-09 ENCOUNTER — Other Ambulatory Visit (HOSPITAL_COMMUNITY)
Admission: RE | Admit: 2020-07-09 | Discharge: 2020-07-09 | Disposition: A | Discharge status: Home / Self Care | Payer: Medicare Other | Source: Ambulatory Visit | Attending: Cardiology | Admitting: Cardiology

## 2020-07-09 ENCOUNTER — Other Ambulatory Visit (HOSPITAL_COMMUNITY)
Admission: RE | Admit: 2020-07-09 | Discharge: 2020-07-09 | Disposition: A | Discharge status: Home / Self Care | Payer: Medicare Other | Source: Ambulatory Visit | Attending: Interventional Cardiology | Admitting: Interventional Cardiology

## 2020-07-09 ENCOUNTER — Other Ambulatory Visit: Payer: Self-pay

## 2020-07-09 DIAGNOSIS — R002 Palpitations: Secondary | ICD-10-CM | POA: Insufficient documentation

## 2020-07-09 DIAGNOSIS — Z20822 Contact with and (suspected) exposure to covid-19: Secondary | ICD-10-CM | POA: Insufficient documentation

## 2020-07-09 LAB — SARS CORONAVIRUS 2 (TAT 6-24 HRS): SARS Coronavirus 2: NEGATIVE

## 2020-07-09 LAB — CBC
HCT: 45.9 % (ref 36.0–46.0)
Hemoglobin: 14.9 g/dL (ref 12.0–15.0)
MCH: 29.7 pg (ref 26.0–34.0)
MCHC: 32.5 g/dL (ref 30.0–36.0)
MCV: 91.4 fL (ref 80.0–100.0)
Platelets: 186 10*3/uL (ref 150–400)
RBC: 5.02 MIL/uL (ref 3.87–5.11)
RDW: 13.1 % (ref 11.5–15.5)
WBC: 5 10*3/uL (ref 4.0–10.5)
nRBC: 0 % (ref 0.0–0.2)

## 2020-07-09 LAB — BASIC METABOLIC PANEL
Anion gap: 9 (ref 5–15)
BUN: 17 mg/dL (ref 8–23)
CO2: 25 mmol/L (ref 22–32)
Calcium: 9 mg/dL (ref 8.9–10.3)
Chloride: 102 mmol/L (ref 98–111)
Creatinine, Ser: 0.69 mg/dL (ref 0.44–1.00)
GFR, Estimated: >60 mL/min (ref >60)
Glucose, Bld: 137 mg/dL — ABNORMAL HIGH (ref 70–99)
Potassium: 4.2 mmol/L (ref 3.5–5.1)
Sodium: 136 mmol/L (ref 135–145)

## 2020-07-09 NOTE — Telephone Encounter (Signed)
Pt contacted pre-catheterization scheduled at [facility] for: Tuesday July 10, 2020 7:30 AM Verified arrival time and place: [facility]) at: 5:30 AM   No solid food after midnight prior to cath, clear liquids until 5 AM day of procedure.  Hold: Metformin-day of procedure and 48 hours post procedure.  Except hold medications AM meds can be  taken pre-cath with sips of water including: ASA 81 mg   Confirmed patient has responsible adult to drive home post procedure and be with patient first 24 hours after arriving home: yes  You are allowed ONE visitor in the waiting room during the time you are at the hospital for your procedure. Both you and your visitor must wear a mask once you enter the hospital.       COVID-19 Pre-Screening Questions:   In the past 14 days have you had a new cough, new headache, new nasal congestion, fever (100.4 or greater) unexplained body aches, new sore throat, or sudden loss of taste or sense of smell? no  In the past 14 days have you been around anyone with known Covid 19? no   Reviewed procedure/mask/visitor instructions, COVID-19 questions reviewed with patient.

## 2020-07-09 NOTE — H&P (Signed)
IMA to LAD 2006 Cath 2016 Diagnostic Dominance: Right

## 2020-07-10 ENCOUNTER — Encounter (HOSPITAL_COMMUNITY): Payer: Self-pay | Admitting: Interventional Cardiology

## 2020-07-10 ENCOUNTER — Ambulatory Visit (HOSPITAL_COMMUNITY)
Admission: RE | Admit: 2020-07-10 | Discharge: 2020-07-10 | Disposition: A | Discharge status: Home / Self Care | Payer: Medicare Other | Attending: Interventional Cardiology | Admitting: Interventional Cardiology

## 2020-07-10 ENCOUNTER — Ambulatory Visit (HOSPITAL_COMMUNITY)
Admission: RE | Disposition: A | Discharge status: Home / Self Care | Payer: Self-pay | Source: Home / Self Care | Attending: Interventional Cardiology

## 2020-07-10 DIAGNOSIS — E782 Mixed hyperlipidemia: Secondary | ICD-10-CM | POA: Diagnosis present

## 2020-07-10 DIAGNOSIS — I25119 Atherosclerotic heart disease of native coronary artery with unspecified angina pectoris: Secondary | ICD-10-CM

## 2020-07-10 DIAGNOSIS — Z882 Allergy status to sulfonamides status: Secondary | ICD-10-CM | POA: Insufficient documentation

## 2020-07-10 DIAGNOSIS — Z951 Presence of aortocoronary bypass graft: Secondary | ICD-10-CM | POA: Diagnosis not present

## 2020-07-10 DIAGNOSIS — I10 Essential (primary) hypertension: Secondary | ICD-10-CM | POA: Insufficient documentation

## 2020-07-10 DIAGNOSIS — E119 Type 2 diabetes mellitus without complications: Secondary | ICD-10-CM

## 2020-07-10 DIAGNOSIS — R002 Palpitations: Secondary | ICD-10-CM | POA: Diagnosis present

## 2020-07-10 DIAGNOSIS — Z955 Presence of coronary angioplasty implant and graft: Secondary | ICD-10-CM | POA: Insufficient documentation

## 2020-07-10 DIAGNOSIS — I25709 Atherosclerosis of coronary artery bypass graft(s), unspecified, with unspecified angina pectoris: Secondary | ICD-10-CM | POA: Insufficient documentation

## 2020-07-10 DIAGNOSIS — I25118 Atherosclerotic heart disease of native coronary artery with other forms of angina pectoris: Secondary | ICD-10-CM | POA: Diagnosis not present

## 2020-07-10 HISTORY — PX: LEFT HEART CATH AND CORONARY ANGIOGRAPHY: CATH118249

## 2020-07-10 LAB — GLUCOSE, CAPILLARY
Glucose-Capillary: 133 mg/dL — ABNORMAL HIGH (ref 70–99)
Glucose-Capillary: 125 mg/dL — ABNORMAL HIGH (ref 70–99)

## 2020-07-10 SURGERY — LEFT HEART CATH AND CORONARY ANGIOGRAPHY
Anesthesia: LOCAL

## 2020-07-10 MED ORDER — HEPARIN (PORCINE) IN NACL 1000-0.9 UT/500ML-% IV SOLN
INTRAVENOUS | Status: DC | PRN
  Administered 2020-07-10 (×2): 500 mL

## 2020-07-10 MED ORDER — SODIUM CHLORIDE 0.9% FLUSH: 3.0000 mL | Freq: Two times a day (BID) | INTRAVENOUS | Status: DC

## 2020-07-10 MED ORDER — ASPIRIN 81 MG PO CHEW: 81.0000 mg | CHEWABLE_TABLET | ORAL | Status: DC

## 2020-07-10 MED ORDER — SODIUM CHLORIDE 0.9% FLUSH: 3.0000 mL | INTRAVENOUS | Status: DC | PRN

## 2020-07-10 MED ORDER — MIDAZOLAM HCL 2 MG/2ML IJ SOLN
INTRAMUSCULAR | Status: AC
  Filled 2020-07-10: qty 2

## 2020-07-10 MED ORDER — SODIUM CHLORIDE 0.9 % WEIGHT BASED INFUSION: 1.0000 mL/kg/h | INTRAVENOUS | Status: DC

## 2020-07-10 MED ORDER — ACETAMINOPHEN 325 MG PO TABS: 650.0000 mg | ORAL_TABLET | ORAL | Status: DC | PRN

## 2020-07-10 MED ORDER — FENTANYL CITRATE (PF) 100 MCG/2ML IJ SOLN
INTRAMUSCULAR | Status: DC | PRN
  Administered 2020-07-10: 25 ug via INTRAVENOUS

## 2020-07-10 MED ORDER — MIDAZOLAM HCL 2 MG/2ML IJ SOLN
INTRAMUSCULAR | Status: DC | PRN
  Administered 2020-07-10 (×2): 1 mg via INTRAVENOUS

## 2020-07-10 MED ORDER — ASPIRIN 81 MG PO CHEW: 81.0000 mg | CHEWABLE_TABLET | Freq: Every day | ORAL | Status: DC

## 2020-07-10 MED ORDER — SODIUM CHLORIDE 0.9 % IV SOLN
INTRAVENOUS | Status: AC | PRN
  Administered 2020-07-10: 10 mL/h via INTRAVENOUS

## 2020-07-10 MED ORDER — SODIUM CHLORIDE 0.9 % IV SOLN: 250.0000 mL | INTRAVENOUS | Status: DC | PRN

## 2020-07-10 MED ORDER — SODIUM CHLORIDE 0.9 % IV SOLN: INTRAVENOUS | Status: AC

## 2020-07-10 MED ORDER — FENTANYL CITRATE (PF) 100 MCG/2ML IJ SOLN
INTRAMUSCULAR | Status: AC
  Filled 2020-07-10: qty 2

## 2020-07-10 MED ORDER — VERAPAMIL HCL 2.5 MG/ML IV SOLN
INTRAVENOUS | Status: AC
  Filled 2020-07-10: qty 2

## 2020-07-10 MED ORDER — ONDANSETRON HCL 4 MG/2ML IJ SOLN: 4.0000 mg | Freq: Four times a day (QID) | INTRAMUSCULAR | Status: DC | PRN

## 2020-07-10 MED ORDER — HEPARIN (PORCINE) IN NACL 1000-0.9 UT/500ML-% IV SOLN
INTRAVENOUS | Status: AC
  Filled 2020-07-10: qty 1000

## 2020-07-10 MED ORDER — LIDOCAINE HCL (PF) 1 % IJ SOLN
INTRAMUSCULAR | Status: AC
  Filled 2020-07-10: qty 30

## 2020-07-10 MED ORDER — LIDOCAINE HCL (PF) 1 % IJ SOLN
INTRAMUSCULAR | Status: DC | PRN
  Administered 2020-07-10: 20 mL

## 2020-07-10 MED ORDER — IOHEXOL 350 MG/ML SOLN
INTRAVENOUS | Status: DC | PRN
  Administered 2020-07-10: 70 mL

## 2020-07-10 MED ORDER — LABETALOL HCL 5 MG/ML IV SOLN: 10.0000 mg | INTRAVENOUS | Status: DC | PRN

## 2020-07-10 MED ORDER — SODIUM CHLORIDE 0.9 % WEIGHT BASED INFUSION
3.0000 mL/kg/h | INTRAVENOUS | Status: AC
  Administered 2020-07-10: 3 mL/kg/h via INTRAVENOUS

## 2020-07-10 MED ORDER — HYDRALAZINE HCL 20 MG/ML IJ SOLN: 10.0000 mg | INTRAMUSCULAR | Status: DC | PRN

## 2020-07-10 SURGICAL SUPPLY — 9 items
CATH INFINITI 5FR MPB2 (CATHETERS) ×1 IMPLANT
GLIDESHEATH SLEND A-KIT 6F 22G (SHEATH) ×1 IMPLANT
KIT HEART LEFT (KITS) ×2 IMPLANT
PACK CARDIAC CATHETERIZATION (CUSTOM PROCEDURE TRAY) ×2 IMPLANT
SHEATH PINNACLE 5F 10CM (SHEATH) ×1 IMPLANT
SHEATH PROBE COVER 6X72 (BAG) ×1 IMPLANT
TRANSDUCER W/STOPCOCK (MISCELLANEOUS) ×2 IMPLANT
TUBING CIL FLEX 10 FLL-RA (TUBING) ×2 IMPLANT
WIRE EMERALD 3MM-J .035X150CM (WIRE) ×1 IMPLANT

## 2020-07-10 NOTE — Progress Notes (Signed)
Site area: rt groin arterial site Site Prior to Removal:  Level 0 Pressure Applied For: 20 minutes Manual:   yes Patient Status During Pull:  stable Post Pull Site:  Level 0 Post Pull Instructions Given:  yes Post Pull Pulses Present: rt dp palpable Dressing Applied:  Gauze and tegaderm Bedrest begins @ 0910 Comments:

## 2020-07-10 NOTE — Interval H&P Note (Signed)
Cath Lab Visit (complete for each Cath Lab visit)  Clinical Evaluation Leading to the Procedure:   ACS: No.  Non-ACS:    Anginal Classification: CCS III  Anti-ischemic medical therapy: Minimal Therapy (1 class of medications)  Non-Invasive Test Results: No non-invasive testing performed  Prior CABG: Previous CABG      History and Physical Interval Note:  07/10/2020 7:44 AM  Kimberly Flowers  has presented today for surgery, with the diagnosis of chest pain.  The various methods of treatment have been discussed with the patient and family. After consideration of risks, benefits and other options for treatment, the patient has consented to  Procedure(s): LEFT HEART CATH AND CORONARY ANGIOGRAPHY (N/A) as a surgical intervention.  The patient's history has been reviewed, patient examined, no change in status, stable for surgery.  I have reviewed the patient's chart and labs.  Questions were answered to the patient's satisfaction.     [name] III

## 2020-07-10 NOTE — Progress Notes (Signed)
Patient and husband was given discharge instructions. Both verbalized understanding. 

## 2020-07-10 NOTE — Discharge Instructions (Signed)
Femoral Site Care This sheet gives you information about how to care for yourself after your procedure. Your health care provider may also give you more specific instructions. If you have problems or questions, contact your health care provider. What can I expect after the procedure? After the procedure, it is common to have:  Bruising that usually fades within 1-2 weeks.  Tenderness at the site. Follow these instructions at home: Wound care  Follow instructions from your health care provider about how to take care of your insertion site. Make sure you: ? Wash your hands with soap and water before you change your bandage (dressing). If soap and water are not available, use hand sanitizer. ? Change your dressing as told by your health care provider. ? Leave stitches (sutures), skin glue, or adhesive strips in place. These skin closures may need to stay in place for 2 weeks or longer. If adhesive strip edges start to loosen and curl up, you may trim the loose edges. Do not remove adhesive strips completely unless your health care provider tells you to do that.  Do not take baths, swim, or use a hot tub until your health care provider approves.  You may shower 24-48 hours after the procedure or as told by your health care provider. ? Gently wash the site with plain soap and water. ? Pat the area dry with a clean towel. ? Do not rub the site. This may cause bleeding.  Do not apply powder or lotion to the site. Keep the site clean and dry.  Check your femoral site every day for signs of infection. Check for: ? Redness, swelling, or pain. ? Fluid or blood. ? Warmth. ? Pus or a bad smell. Activity  For the first 2-3 days after your procedure, or as long as directed: ? Avoid climbing stairs as much as possible. ? Do not squat.  Do not lift anything that is heavier than 10 lb (4.5 kg), or the limit that you are told, until your health care provider says that it is safe.  Rest as  directed. ? Avoid sitting for a long time without moving. Get up to take short walks every 1-2 hours.  Do not drive for 24 hours if you were given a medicine to help you relax (sedative). General instructions  Take over-the-counter and prescription medicines only as told by your health care provider.  Keep all follow-up visits as told by your health care provider. This is important. Contact a health care provider if you have:  A fever or chills.  You have redness, swelling, or pain around your insertion site. Get help right away if:  The catheter insertion area swells very fast.  You pass out.  You suddenly start to sweat or your skin gets clammy.  The catheter insertion area is bleeding, and the bleeding does not stop when you hold steady pressure on the area.  The area near or just beyond the catheter insertion site becomes pale, cool, tingly, or numb. These symptoms may represent a serious problem that is an emergency. Do not wait to see if the symptoms will go away. Get medical help right away. Call your local emergency services (911 in the [place] Do not drive yourself to the hospital. Summary  After the procedure, it is common to have bruising that usually fades within 1-2 weeks.  Check your femoral site every day for signs of infection.  Do not lift anything that is heavier than 10 lb (4.5 kg), or the   limit that you are told, until your health care provider says that it is safe. This information is not intended to replace advice given to you by your health care provider. Make sure you discuss any questions you have with your health care provider. Document Revised: 09/07/2017 Document Reviewed: 09/07/2017 Elsevier Patient Education  2020 [employer]  

## 2020-07-11 MED FILL — Verapamil HCl IV Soln 2.5 MG/ML: INTRAVENOUS | Qty: 2 | Status: AC

## 2020-07-20 ENCOUNTER — Telehealth: Payer: Self-pay | Admitting: Cardiology

## 2020-07-20 ENCOUNTER — Ambulatory Visit (INDEPENDENT_AMBULATORY_CARE_PROVIDER_SITE_OTHER): Payer: Medicare Other | Admitting: Family Medicine

## 2020-07-20 ENCOUNTER — Encounter: Payer: Self-pay | Admitting: Family Medicine

## 2020-07-20 VITALS — BP 124/78 | HR 66 | Ht 65.5 in | Wt 191.8 lb

## 2020-07-20 DIAGNOSIS — E782 Mixed hyperlipidemia: Secondary | ICD-10-CM | POA: Diagnosis not present

## 2020-07-20 DIAGNOSIS — I200 Unstable angina: Secondary | ICD-10-CM | POA: Diagnosis not present

## 2020-07-20 DIAGNOSIS — I10 Essential (primary) hypertension: Secondary | ICD-10-CM | POA: Diagnosis not present

## 2020-07-20 DIAGNOSIS — I2510 Atherosclerotic heart disease of native coronary artery without angina pectoris: Secondary | ICD-10-CM | POA: Diagnosis not present

## 2020-07-20 DIAGNOSIS — E1159 Type 2 diabetes mellitus with other circulatory complications: Secondary | ICD-10-CM | POA: Diagnosis not present

## 2020-07-20 MED ORDER — RANOLAZINE ER 500 MG PO TB12: 500.0000 mg | ORAL_TABLET | Freq: Two times a day (BID) | ORAL | 6 refills | Status: DC

## 2020-07-20 MED ORDER — NEBIVOLOL HCL 10 MG PO TABS: 10.0000 mg | ORAL_TABLET | Freq: Every day | ORAL | 6 refills | Status: AC

## 2020-07-20 MED ORDER — ISOSORBIDE MONONITRATE ER 30 MG PO TB24: 30.0000 mg | ORAL_TABLET | Freq: Every day | ORAL | 6 refills | Status: DC

## 2020-07-20 NOTE — Progress Notes (Signed)
Cardiology Office Note  Date: 07/22/2020   ID: Charika, Mikelson 1941-04-12, MRN [id number]  PCP:  [name], MD  Cardiologist:  [name], MD Electrophysiologist:  None   Chief Complaint: Follow-up CAD, cardiac catheterization  History of Present Illness: Kimberly Flowers is a 79 y.o. female with a history of CAD, HLD, PAD, palpitations, DM 2.  Last encounter with Dr. Harl Bowie on 07/05/2020.  History of prior CABG with subsequent occlusion of LIMA-LAD graft.  Last catheterization showed patent LAD stent.  Low risk nuclear stress 09/16/2019.  PCP note patient had recent chest pain with episodes 2-3 times a week.  Episodes after light housework 12/10 in severity with shortness of breath.  After nitroglycerin spray symptoms resolved.  Was walking daily 20 to 30 minutes with no exertional symptoms.  Described symptoms as similar to prior angina she had before previous coronary interventions.  Blood pressure within normal limits.  Dr. Harl Bowie thought it best to arrange for cardiac catheterization given her symptoms.  Imdur was increased to 60 mg daily.  Cardiac catheterization on 07/10/2020 demonstrated severe two-vessel CAD involving dominant RCA and previously stented mid to distal LAD.  Left main was widely patent.  LAD with segmental 60 to 70% mid stenosis proximal to a previously placed stent in the distal segment.  Stent jailed  large and moderate size second and third diagonals.  Third diagonal proximal and mid significant stenosis.  LAD supplies collaterals to RCA via septal perforators around the apex.  Circumflex gives origin to 2 obtuse marginal branches which are free of any significant obstruction.  Proximal circumflex contains eccentric 20% stenosis.  RI large and widely patent.  RCA dominant and totally occluded in the mid to distal segment and receives collaterals from right to right and left to right.  His work to discuss with RCA  was approachable by CTO team.  Recommended  to titrate nitrate therapy to improve angina.  If refractory symptoms consider repeat CABG to second and third diagonal as well as the distal LAD.  Also need to have LV branch of RCA and PDA grafted same time.  She is her today status post cardiac catheterization. She states since the Imdur was increased she has been having terrible headaches. She states since the increase in dosage her chest pain has decreased some but not alleviated. She complains of continuing rest pain. She states she has pain when she first get up in the morning and pain on and off mid sternal with pain radiating to her neck area. This is worse with activity. We discussed the results of the catheterization and the discussion of CTO intervention to RCA in post cath note regarding anti-anginal therapy. Note stated if symptoms were refractory could consider repeat CABG to 2nd and #rd diagonals and distal LAD. Would also need to have LV branch of RCA and PDA grafted at the the same time. We discussed the intervention and its accompanying risks due to previous bypass grafting and other risk factors. There is a note from Dr [name] to Dr Harl Bowie regarding the possible options for intervention noted below.  "Looks like it could be done. Vessel is definitely smaller than before but this may be due to poor flow. There appears to be an antegrade channel as well as a good retrograde option. It would likely need a lot of stent. Given the fact that she had prior bypass risk for surgery would be higher and she wouldn't even get a mammary graft to  her LAD. It would seem reasonable to try PCI. If unable to open the right then surgery is still an option"  She is frustrated  there was no intervention performed when she had the catheterization. I discussed the fact that if the vessels were simply in need of stent intervention this would have been done but  her situation was more complicated. She states her right groin access looks good and her husband has  been checking it every day. He activity is limited by her continuing rest and exertional chest and neck pain. She would like an alternative to the higher dosing of Imdur to treat chest pain due to severe headaches on increased dosage.      Past Medical History:  Diagnosis Date  . Anemia   . Coronary atherosclerosis of native coronary artery    Previous PCI 2007-2008, Dr. Sharyon Cable  . Essential hypertension, benign   . Mixed hyperlipidemia   . PAD (peripheral artery disease) (HCC)     Absent right DP  . Palpitations   . Type 2 diabetes mellitus ([place])   . Vasomotor rhinitis     Past Surgical History:  Procedure Laterality Date  . ABDOMINAL HYSTERECTOMY    . CARDIAC CATHETERIZATION N/A 08/17/2015   Procedure: Left Heart Cath and Coronary Angiography;  Surgeon: Burnell Blanks, MD;  Location: [facility];  Service: Cardiovascular;  Laterality: N/A;  . CORONARY ARTERY BYPASS GRAFT  2006   [name], single vessel  . LEFT HEART CATH AND CORONARY ANGIOGRAPHY N/A 07/10/2020   Procedure: LEFT HEART CATH AND CORONARY ANGIOGRAPHY;  Surgeon: [name], MD;  Location: [facility];  Service: Cardiovascular;  Laterality: N/A;    Current Outpatient Medications  Medication Sig Dispense Refill  . acetaminophen (TYLENOL) 650 MG CR tablet Take 650-1,300 mg by mouth every 8 (eight) hours as needed for pain.    Marland Kitchen amLODipine (NORVASC) 10 MG tablet TAKE 1 TABLET BY MOUTH ONCE DAILY NEEDS  OFFICE  VISIT (Patient taking differently: Take 10 mg by mouth daily. ) 90 tablet 1  . Ascorbic Acid (VITAMIN C) 1000 MG tablet Take 1,000 mg by mouth daily.     Marland Kitchen aspirin 81 MG EC tablet Take 81 mg by mouth daily.    Marland Kitchen atorvastatin (LIPITOR) 40 MG tablet TAKE 1 TABLET BY MOUTH ONCE DAILY NEEDS  OFFICE  VISIT (Patient taking differently: Take 40 mg by mouth daily. ) 90 tablet 1  . Calcium Carb-Cholecalciferol (CALCIUM 600 + D PO) Take 2 tablets by mouth daily.    . cholecalciferol (VITAMIN D3) 25  MCG (1000 UNIT) tablet Take 1,000 Units by mouth daily.    Marland Kitchen docusate sodium (COLACE) 100 MG capsule Take 100 mg by mouth daily as needed for mild constipation or moderate constipation.     Marland Kitchen esomeprazole (NEXIUM) 20 MG capsule Take 20 mg by mouth daily.     . ferrous gluconate (FERGON) 324 MG tablet Take 324 mg by mouth at bedtime.     . fexofenadine (ALLEGRA) 180 MG tablet Take 180 mg by mouth daily.    . Glucosamine-Chondroitin-MSM-D3 TABS Take 2 tablets by mouth daily.    . isosorbide mononitrate (IMDUR) 30 MG 24 hr tablet Take 1 tablet (30 mg total) by mouth daily. 30 tablet 6  . losartan (COZAAR) 50 MG tablet Take 50 mg by mouth 2 (two) times daily.    . metFORMIN (GLUCOPHAGE) 500 MG tablet Take 500 mg by mouth daily.    . Multiple  Vitamin (MULTIVITAMIN) tablet Take 1 tablet by mouth daily.    . nebivolol (BYSTOLIC) 10 MG tablet Take 1 tablet (10 mg total) by mouth daily. 30 tablet 6  . nitroGLYCERIN (NITROLINGUAL) 0.4 MG/SPRAY spray Place 1 spray under the tongue every 5 (five) minutes x 3 doses as needed for chest pain (if no relief after 3rd dose, proceed to the ED for an evaluation). 4.9 g 0  . Omega-3 Fatty Acids (FISH OIL) 1200 MG CAPS Take 1,200 mg by mouth 2 (two) times daily.     . ranolazine (RANEXA) 500 MG 12 hr tablet Take 1 tablet (500 mg total) by mouth 2 (two) times daily. 60 tablet 6   Current Facility-Administered Medications  Medication Dose Route Frequency Provider Last Rate Last Admin  . sodium chloride flush (NS) 0.9 % injection 3 mL  3 mL Intravenous Q12H Branch, Alphonse Guild, MD       Allergies:  Sulfonamide derivatives   Social History: The patient  reports that she has never smoked. She has never used smokeless tobacco. She reports that she does not drink alcohol and does not use drugs.   Family History: The patient's family history includes CAD in an other family member; Cancer in her sister.   ROS:  Please see the history of present illness. Otherwise, complete  review of systems is positive for none.  All other systems are reviewed and negative.   Physical Exam: VS:  BP 124/78   Pulse 66   Ht 5' 5.5" (1.664 m)   Wt 191 lb 12.8 oz (87 kg)   SpO2 94%   BMI 31.43 kg/m , BMI Body mass index is 31.43 kg/m.  Wt Readings from Last 3 Encounters:  07/20/20 191 lb 12.8 oz (87 kg)  07/10/20 189 lb (85.7 kg)  07/05/20 191 lb (86.6 kg)    General: Patient appears comfortable at rest. Neck: Supple, no elevated JVP or carotid bruits, no thyromegaly. Lungs: Clear to auscultation, nonlabored breathing at rest. Cardiac: Regular rate and rhythm, no S3 or significant systolic murmur, no pericardial rub. Extremities: No pitting edema, distal pulses 2+. Skin: Warm and dry.Rt groin access site clean without bruising , swelling or pain Musculoskeletal: No kyphosis. Neuropsychiatric: Alert and oriented x3, affect grossly appropriate.  ECG: Sinus rhythm with marked sinus arrhythmia rate of 66   Recent Labwork: 07/09/2020: BUN 17; Creatinine, Ser 0.69; Hemoglobin 14.9; Platelets 186; Potassium 4.2; Sodium 136     Component Value Date/Time   CHOL 136 08/17/2015 0412   TRIG 111 08/17/2015 0412   HDL 40 (L) 08/17/2015 0412   CHOLHDL 3.4 08/17/2015 0412   VLDL 22 08/17/2015 0412   LDLCALC 74 08/17/2015 0412    Other Studies Reviewed Today:  07/10/2020 LEFT HEART CATH AND CORONARY ANGIOGRAPHY  Conclusion   Severe two-vessel coronary artery disease involving the dominant right coronary and the previously stented mid to distal LAD which involves to moderate to large diagonals.  Left main is widely patent  LAD contains segmental 60 to 70% mid stenosis proximal to a previously placed stent in the distal segment.  The stent jails a large and moderate sized second and third diagonals.  The third diagonal has proximal and mid significant stenoses.  LAD supplies collaterals to the right coronary via septal perforators and around the apex.  Circumflex gives  origin to 2 obtuse marginal branches which are free of any significant obstruction.  The proximal circumflex contains eccentric 20% stenosis.  Ramus intermedius is large and widely patent  Right coronary is dominant and totally occluded in the mid to distal segment and receives collaterals from right to right and left to right.  Normal LV function.  LVEDP 8 mmHg.  EF 55%.  RECOMMENDATIONS:   We will discuss whether RCA is approachable by the CTO team.  Would up titrate nitrate therapy to improve angina.  If refractory symptoms, consider repeat coronary bypass to the second and third diagonals as well as the distal LAD.  Would also need to have LV branch of RCA and PDA grafted at the same time.  If only interventional approach is stenting of the mid LAD this would lead to a double layer of stent and will have limited long-term patency Diagnostic Dominance: Right    Assessment and Plan:  1. CAD in native artery   2. Unstable angina ([place])   3. Essential hypertension, benign   4. Mixed hyperlipidemia   5. Type 2 diabetes mellitus with other circulatory complication, without long-term current use of insulin ([place])    1. CAD in native artery Status post CABG x 1 2006. LIMA-LAD. Previous Cardiac catheterization with patent LAD stent. Low risk stress test 09/2017. Recent CP / Jaw pain severe with SOB. S/P Cardiac  Catheterization 07/10/2020 : Severe two-vessel disease. See report above. Plan was for medical therapy. If refractory symptoms, plans were for either PCI or bypass per discussion in HPI above and in post cardiac catheterization note.   2. Unstable angina ([place]) She continues with chest pain / jaw pain / sob per her statement today. She states the chest pain is a little better but continues. She is not tolerating the increased dose of Imdur stating it is giving her severe headaches. We will decrease Imdur back to 30 mg daily as she was tolerating this dose. Will start Ranexa 500 mg po  bid. Will increase Bystolic to 10 mg daily. Continue ASA 81 mg po daily, NTG Spray prn CP.  3. Essential hypertension, benign BP well controlled on current medical therapy. Continue Amlodipine 10 mg daily. Losartan 50 mg po bid.  4. Mixed hyperlipidemia Continue Atorvastatin 40 mg po daily.Labs from PCP 02/09/2020: TC 142, TG 125, HDL 48, LDL 72. Not at goal. May need to increase statin at follow up.  5. Type 2 diabetes mellitus with other circulatory complication, without long-term current use of insulin ([place]) Managed by PCP . Recent fasting glucose 125 mg /dl. Hgb A1c 6.3%.    Medication Adjustments/Labs and Tests Ordered: Current medicines are reviewed at length with the patient today.  Concerns regarding medicines are outlined above.   Disposition: Follow-up with Dr Harl Bowie or APP 2 weeks.  Signed, Levell July, NP 07/22/2020 6:57 PM    [facility], [place], [place] [address] Phone: [phone]; Fax: [phone]

## 2020-07-20 NOTE — Patient Instructions (Addendum)
Medication Instructions:   Begin Ranexa 500mg  twice a day   Decrease Imdur to 30mg  daily.   Increase Bystolic to 10mg  daily.  Labwork: none  Testing/Procedures: none  Follow-Up: 2 weeks   Any Other Special Instructions Will Be Listed Below (If Applicable).  If you need a refill on your cardiac medications before your next appointment, please call your pharmacy.

## 2020-07-20 NOTE — Telephone Encounter (Signed)
Adding conversation with intervention/CTO clinic to her chart for her follow.   Per Dr [name]  Looks like it could be done. Vessel is definitely smaller than before but this may be due to poor flow. There appears to be an antegrade channel as well as a good retrograde option. It would likely need a lot of stent. Given the fact that she had prior bypass risk for surgery would be higher and she wouldn't even get a mammary graft to her LAD. It would seem reasonable to try PCI. If unable to open the right then surgery is still an option.     [name] MD

## 2020-07-23 ENCOUNTER — Ambulatory Visit: Payer: Medicare Other | Admitting: Family Medicine

## 2020-07-25 ENCOUNTER — Telehealth: Payer: Self-pay | Admitting: Cardiology

## 2020-07-25 NOTE — Telephone Encounter (Signed)
Pt aware that our providers are recommending covid vaccines and booster and that any further questions can be addressed with pcp

## 2020-07-25 NOTE — Telephone Encounter (Signed)
Patient called wanting to know about taking the booster shot while she is on the new heart medication.

## 2020-08-05 NOTE — Progress Notes (Signed)
Cardiology Office Note  Date: 08/05/2020   ID: Breeanna, Galgano 1941-06-06, MRN [id number]  PCP:  [name], MD  Cardiologist:  [name], MD Electrophysiologist:  None   Chief Complaint: Follow-up CAD, cardiac catheterization  History of Present Illness: CAROLLYN ETCHEVERRY is a 79 y.o. female with a history of CAD, HLD, PAD, palpitations, DM 2.  Last encounter with Dr. Harl Bowie on 07/05/2020.  History of prior CABG with subsequent occlusion of LIMA-LAD graft.  Last catheterization showed patent LAD stent.  Low risk nuclear stress 09/16/2019.  PCP note patient had recent chest pain with episodes 2-3 times a week.  Episodes after light housework 12/10 in severity with shortness of breath.  After nitroglycerin spray symptoms resolved.  Was walking daily 20 to 30 minutes with no exertional symptoms.  Described symptoms as similar to prior angina she had before previous coronary interventions.  Blood pressure within normal limits.  Dr. Harl Bowie thought it best to arrange for cardiac catheterization given her symptoms.  Imdur was increased to 60 mg daily.  Cardiac catheterization on 07/10/2020 demonstrated severe two-vessel CAD involving dominant RCA and previously stented mid to distal LAD.  Left main was widely patent.  LAD with segmental 60 to 70% mid stenosis proximal to a previously placed stent in the distal segment.  Stent jailed  large and moderate size second and third diagonals.  Third diagonal proximal and mid significant stenosis.  LAD supplies collaterals to RCA via septal perforators around the apex.  Circumflex gives origin to 2 obtuse marginal branches which are free of any significant obstruction.  Proximal circumflex contains eccentric 20% stenosis.  RI large and widely patent.  RCA dominant and totally occluded in the mid to distal segment and receives collaterals from right to right and left to right.  His work to discuss with RCA  was approachable by CTO team.  Recommended  to titrate nitrate therapy to improve angina.  If refractory symptoms consider repeat CABG to second and third diagonal as well as the distal LAD.  Also need to have LV branch of RCA and PDA grafted same time.  She was here at last visit status post cardiac catheterization. She stated since the Imdur was increased she was having terrible headaches. She dated since the increase in dosage her chest pain has decreased some but not alleviated. She complains of continuing rest pain. She states she has pain when she first get up in the morning and pain on and off mid sternal with pain radiating to her neck area. This is worse with activity. We discussed the results of the catheterization and the discussion of CTO intervention to RCA in post cath note regarding anti-anginal therapy. Note stated if symptoms were refractory could consider repeat CABG to 2nd and 3rd diagonals and distal LAD. Would also need to have LV branch of RCA and PDA grafted at the the same time. We discussed the intervention and its accompanying risks due to previous bypass grafting and other risk factors. There is a note from Dr [name] to Dr Harl Bowie regarding the possible options for intervention noted below.  "Looks like it could be done. Vessel is definitely smaller than before but this may be due to poor flow. There appears to be an antegrade channel as well as a good retrograde option. It would likely need a lot of stent. Given the fact that she had prior bypass risk for surgery would be higher and she wouldn't even get a mammary graft  to her LAD. It would seem reasonable to try PCI. If unable to open the right then surgery is still an option"  She was frustrated  there was no intervention performed when she had the catheterization. I discussed the fact that if the vessels were simply in need of stent intervention this would have been done but her situation was more complicated.    She is here today for follow-up after we started Ranexa  and reduced her dose of Imdur due to Imdur causing severe headaches.  She states the Ranexa is making her feel dizzy.  States she is using the nitroglycerin sublingual more often with continued resting chest pain.  Activity is limited by her continuing rest and exertional chest and neck pain.  She states she thinks she needs definitive therapy on her coronary arteries due to continued chest pain.  She states she knows the medication is only treating the symptoms not a problem.  I advised her avoids peak with Dr. Harl Bowie regarding referral to cardiac surgical team.  Past Medical History:  Diagnosis Date  . Anemia   . Coronary atherosclerosis of native coronary artery    Previous PCI 2007-2008, Dr. Sharyon Cable  . Essential hypertension, benign   . Mixed hyperlipidemia   . PAD (peripheral artery disease) (HCC)     Absent right DP  . Palpitations   . Type 2 diabetes mellitus ([place])   . Vasomotor rhinitis     Past Surgical History:  Procedure Laterality Date  . ABDOMINAL HYSTERECTOMY    . CARDIAC CATHETERIZATION N/A 08/17/2015   Procedure: Left Heart Cath and Coronary Angiography;  Surgeon: Burnell Blanks, MD;  Location: [facility];  Service: Cardiovascular;  Laterality: N/A;  . CORONARY ARTERY BYPASS GRAFT  2006   [name], single vessel  . LEFT HEART CATH AND CORONARY ANGIOGRAPHY N/A 07/10/2020   Procedure: LEFT HEART CATH AND CORONARY ANGIOGRAPHY;  Surgeon: [name], MD;  Location: [facility];  Service: Cardiovascular;  Laterality: N/A;    Current Outpatient Medications  Medication Sig Dispense Refill  . acetaminophen (TYLENOL) 650 MG CR tablet Take 650-1,300 mg by mouth every 8 (eight) hours as needed for pain.    Marland Kitchen amLODipine (NORVASC) 10 MG tablet TAKE 1 TABLET BY MOUTH ONCE DAILY NEEDS  OFFICE  VISIT (Patient taking differently: Take 10 mg by mouth daily. ) 90 tablet 1  . Ascorbic Acid (VITAMIN C) 1000 MG tablet Take 1,000 mg by mouth daily.     Marland Kitchen aspirin 81 MG  EC tablet Take 81 mg by mouth daily.    Marland Kitchen atorvastatin (LIPITOR) 40 MG tablet TAKE 1 TABLET BY MOUTH ONCE DAILY NEEDS  OFFICE  VISIT (Patient taking differently: Take 40 mg by mouth daily. ) 90 tablet 1  . Calcium Carb-Cholecalciferol (CALCIUM 600 + D PO) Take 2 tablets by mouth daily.    . cholecalciferol (VITAMIN D3) 25 MCG (1000 UNIT) tablet Take 1,000 Units by mouth daily.    Marland Kitchen docusate sodium (COLACE) 100 MG capsule Take 100 mg by mouth daily as needed for mild constipation or moderate constipation.     Marland Kitchen esomeprazole (NEXIUM) 20 MG capsule Take 20 mg by mouth daily.     . ferrous gluconate (FERGON) 324 MG tablet Take 324 mg by mouth at bedtime.     . fexofenadine (ALLEGRA) 180 MG tablet Take 180 mg by mouth daily.    . Glucosamine-Chondroitin-MSM-D3 TABS Take 2 tablets by mouth daily.    . isosorbide  mononitrate (IMDUR) 30 MG 24 hr tablet Take 1 tablet (30 mg total) by mouth daily. 30 tablet 6  . losartan (COZAAR) 50 MG tablet Take 50 mg by mouth 2 (two) times daily.    . metFORMIN (GLUCOPHAGE) 500 MG tablet Take 500 mg by mouth daily.    . Multiple Vitamin (MULTIVITAMIN) tablet Take 1 tablet by mouth daily.    . nebivolol (BYSTOLIC) 10 MG tablet Take 1 tablet (10 mg total) by mouth daily. 30 tablet 6  . nitroGLYCERIN (NITROLINGUAL) 0.4 MG/SPRAY spray Place 1 spray under the tongue every 5 (five) minutes x 3 doses as needed for chest pain (if no relief after 3rd dose, proceed to the ED for an evaluation). 4.9 g 0  . Omega-3 Fatty Acids (FISH OIL) 1200 MG CAPS Take 1,200 mg by mouth 2 (two) times daily.     . ranolazine (RANEXA) 500 MG 12 hr tablet Take 1 tablet (500 mg total) by mouth 2 (two) times daily. 60 tablet 6   Current Facility-Administered Medications  Medication Dose Route Frequency Provider Last Rate Last Admin  . sodium chloride flush (NS) 0.9 % injection 3 mL  3 mL Intravenous Q12H Branch, Alphonse Guild, MD       Allergies:  Sulfonamide derivatives   Social History: The  patient  reports that she has never smoked. She has never used smokeless tobacco. She reports that she does not drink alcohol and does not use drugs.   Family History: The patient's family history includes CAD in an other family member; Cancer in her sister.   ROS:  Please see the history of present illness. Otherwise, complete review of systems is positive for none.  All other systems are reviewed and negative.   Physical Exam: VS:  There were no vitals taken for this visit., BMI There is no height or weight on file to calculate BMI.  Wt Readings from Last 3 Encounters:  07/20/20 191 lb 12.8 oz (87 kg)  07/10/20 189 lb (85.7 kg)  07/05/20 191 lb (86.6 kg)    General: Patient appears comfortable at rest. Neck: Supple, no elevated JVP or carotid bruits, no thyromegaly. Lungs: Clear to auscultation, nonlabored breathing at rest. Cardiac: Regular rate and rhythm, no S3 or significant systolic murmur, no pericardial rub. Extremities: No pitting edema, distal pulses 2+. Skin: Warm and dry.Rt groin access site clean without bruising , swelling or pain Musculoskeletal: No kyphosis. Neuropsychiatric: Alert and oriented x3, affect grossly appropriate.  ECG: Sinus rhythm with marked sinus arrhythmia rate of 66   Recent Labwork: 07/09/2020: BUN 17; Creatinine, Ser 0.69; Hemoglobin 14.9; Platelets 186; Potassium 4.2; Sodium 136     Component Value Date/Time   CHOL 136 08/17/2015 0412   TRIG 111 08/17/2015 0412   HDL 40 (L) 08/17/2015 0412   CHOLHDL 3.4 08/17/2015 0412   VLDL 22 08/17/2015 0412   LDLCALC 74 08/17/2015 0412    Other Studies Reviewed Today:  07/10/2020 LEFT HEART CATH AND CORONARY ANGIOGRAPHY  Conclusion   Severe two-vessel coronary artery disease involving the dominant right coronary and the previously stented mid to distal LAD which involves to moderate to large diagonals.  Left main is widely patent  LAD contains segmental 60 to 70% mid stenosis proximal to a  previously placed stent in the distal segment.  The stent jails a large and moderate sized second and third diagonals.  The third diagonal has proximal and mid significant stenoses.  LAD supplies collaterals to the right coronary via septal perforators  and around the apex.  Circumflex gives origin to 2 obtuse marginal branches which are free of any significant obstruction.  The proximal circumflex contains eccentric 20% stenosis.  Ramus intermedius is large and widely patent  Right coronary is dominant and totally occluded in the mid to distal segment and receives collaterals from right to right and left to right.  Normal LV function.  LVEDP 8 mmHg.  EF 55%.  RECOMMENDATIONS:   We will discuss whether RCA is approachable by the CTO team.  Would up titrate nitrate therapy to improve angina.  If refractory symptoms, consider repeat coronary bypass to the second and third diagonals as well as the distal LAD.  Would also need to have LV branch of RCA and PDA grafted at the same time.  If only interventional approach is stenting of the mid LAD this would lead to a double layer of stent and will have limited long-term patency Diagnostic Dominance: Right    Assessment and Plan:   1. CAD in native artery Status post CABG x 1 2006. LIMA-LAD. Previous Cardiac catheterization with patent LAD stent. Low risk stress test 09/2017. Recent CP / Jaw pain severe with SOB. S/P Cardiac  Catheterization 07/10/2020 : Severe two-vessel disease. See report above. Plan was for medical therapy. If refractory symptoms, plans were for either PCI or bypass per discussion in HPI above and in post cardiac catheterization note.  Today at follow-up she continues to complain of chest pain.  States yesterday was particularly bad she took 2 nitroglycerin sprays for chest pain.  States she began feeling severely dizzy later.  She states she would like definitive therapy on her coronary arteries.  We will plan to refer to  cardiothoracic surgery after discussion with Dr. Harl Bowie.  2. Unstable angina ([place]) She continues with chest pain / jaw pain / sob per her statement today. She states the chest pain continues and she had a particularly bad day yesterday where she took sprays of nitroglycerin with very little relief.  She also states she believes the Ranexa is causing her dizziness.  Advised her she could stop the Ranexa but continue Imdur. Will increase Bystolic to 10 mg daily. Continue ASA 81 mg po daily, NTG Spray prn CP.  3. Essential hypertension, benign BP well controlled on current medical therapy. Continue Amlodipine 10 mg daily. Losartan 50 mg po bid.  4. Mixed hyperlipidemia Continue Atorvastatin 40 mg po daily.Labs from PCP 02/09/2020: TC 142, TG 125, HDL 48, LDL 72. Not at goal. May need to increase statin at follow up.  5. Type 2 diabetes mellitus with other circulatory complication, without long-term current use of insulin ([place]) Managed by PCP . Recent fasting glucose 125 mg /dl. Hgb A1c 6.3%.    Medication Adjustments/Labs and Tests Ordered: Current medicines are reviewed at length with the patient today.  Concerns regarding medicines are outlined above.   Disposition: Follow-up with Dr Harl Bowie after seen by cardiothoracic surgery.  Signed, Levell July, NP 08/05/2020 6:44 PM    [facility] [place], [place], [place] [address] Phone: [phone]; Fax: [phone]

## 2020-08-06 ENCOUNTER — Ambulatory Visit (INDEPENDENT_AMBULATORY_CARE_PROVIDER_SITE_OTHER): Payer: Medicare Other | Admitting: Family Medicine

## 2020-08-06 ENCOUNTER — Other Ambulatory Visit: Payer: Self-pay | Admitting: *Deleted

## 2020-08-06 ENCOUNTER — Ambulatory Visit: Payer: Medicare Other | Admitting: Family Medicine

## 2020-08-06 ENCOUNTER — Other Ambulatory Visit: Payer: Self-pay

## 2020-08-06 ENCOUNTER — Encounter: Payer: Self-pay | Admitting: Family Medicine

## 2020-08-06 VITALS — BP 116/70 | HR 61 | Ht 65.5 in | Wt 187.8 lb

## 2020-08-06 DIAGNOSIS — I259 Chronic ischemic heart disease, unspecified: Secondary | ICD-10-CM | POA: Diagnosis not present

## 2020-08-06 DIAGNOSIS — I200 Unstable angina: Secondary | ICD-10-CM | POA: Diagnosis not present

## 2020-08-06 NOTE — Patient Instructions (Signed)
Medication Instructions:  Continue all current medications.  Labwork: none  Testing/Procedures: none  Follow-Up: To be determined   Any Other Special Instructions Will Be Listed Below (If Applicable). You have been referred to:  [facility]     If you need a refill on your cardiac medications before your next appointment, please call your pharmacy.

## 2020-08-06 NOTE — Addendum Note (Signed)
Addended by: Laurine Blazer on: 08/06/2020 06:06 PM   Modules accepted: Orders

## 2020-08-08 NOTE — Progress Notes (Signed)
Cardiology Office Note  Date: 08/08/2020   ID: Kimberly Flowers, Kimberly Flowers 1941-01-27, MRN [id number]  PCP:  [name], MD  Cardiologist:  [name], MD Electrophysiologist:  None   Chief Complaint: Follow-up CAD, cardiac catheterization  History of Present Illness: Kimberly Flowers is a 79 y.o. female seen at the request of Dr Harl Bowie for consideration of CTO PCI of the RCA and PCI of the LAD She has  a history of CAD, HLD, PAD, palpitations, DM 2.   History of prior CABG in 2006 at [place]. I have carefully reviewed records available from [facility] and [facility]. She was diagnosed with CAD in 2006. Was felt to not be a good candidate for PCI due to bifurcation lesion in the LAD.  She was referred to CT surgery and underwent single vessel CABG with LIMA to the LAD. Op note indicates that the LAD had an intramyocardial course. It only emerged in the intraventricular groove 3 cm from the apex and was too small to graft at this point. The LIMA was tied into a "medial branch". She had recurrent symptoms and  Cardiac cath demonstrated  subsequent occlusion of LIMA. She did undergo stenting of the LAD with DES and POBA of the diagonal through the stent strut. There is mention of a second stent procedure in ?2008 but I find no documentation of this and review of subsequent angiograms show what appears to be a single stent in the mid LAD. She had repeat cardiac caths in 2012 at [facility] and in 2016 here showing a normal RCA and patent LAD stent.   Low risk nuclear stress 09/16/2019.  Over the past several months she has developed progressive anginal symptoms. This is described as mid substernal pain radiating into jaws and shoulders bilaterally. Associated with SOB. Relieved with sl Ntg but usually has to take 2. Pain has increased in severity and frequency. This led to repeat cardiac cath.   Cardiac catheterization on 07/10/2020 demonstrated severe two-vessel CAD involving dominant RCA and previously stented  mid  LAD.  Left main was widely patent.  I personally reviewed films. LAD with segmental 60 to 70% mid stenosis proximal to a previously placed stent in the mid vessel.  Stent jailed a fairly large second diagonal branch with a 50% stenosis at the ostium. The third diagonal branch is modest in size with severe disease in the proximal and mid vessel.  Circumflex gives origin to 2 obtuse marginal branches which are free of any significant obstruction.    RCA dominant and totally occluded in the mid to distal segment and receives collaterals from right to right and left to right.      Since then medical therapy titrated. She had  terrible headaches related to increased Imdur. Ranexa made her feel severely dizzy and she couldn't sleep. She is on therapeutic beta blocker therapy and high dose amlodipine. She reports her quality of life is now very poor.     Past Medical History:  Diagnosis Date  . Anemia   . Coronary atherosclerosis of native coronary artery    Previous PCI 2007-2008, Dr. Sharyon Cable  . Essential hypertension, benign   . Mixed hyperlipidemia   . PAD (peripheral artery disease) (HCC)     Absent right DP  . Palpitations   . Type 2 diabetes mellitus ([place])   . Vasomotor rhinitis     Past Surgical History:  Procedure Laterality Date  . ABDOMINAL HYSTERECTOMY    . CARDIAC CATHETERIZATION N/A 08/17/2015  Procedure: Left Heart Cath and Coronary Angiography;  Surgeon: Burnell Blanks, MD;  Location: [place] [facility];  Service: Cardiovascular;  Laterality: N/A;  . CORONARY ARTERY BYPASS GRAFT  2006   [name], single vessel  . LEFT HEART CATH AND CORONARY ANGIOGRAPHY N/A 07/10/2020   Procedure: LEFT HEART CATH AND CORONARY ANGIOGRAPHY;  Surgeon: [name], MD;  Location: [place] [facility];  Service: Cardiovascular;  Laterality: N/A;    Current Outpatient Medications  Medication Sig Dispense Refill  . acetaminophen (TYLENOL) 650 MG CR tablet Take 650-1,300 mg by mouth every  8 (eight) hours as needed for pain.    Marland Kitchen amLODipine (NORVASC) 10 MG tablet TAKE 1 TABLET BY MOUTH ONCE DAILY NEEDS  OFFICE  VISIT (Patient taking differently: Take 10 mg by mouth daily. ) 90 tablet 1  . Ascorbic Acid (VITAMIN C) 1000 MG tablet Take 1,000 mg by mouth daily.     Marland Kitchen aspirin 81 MG EC tablet Take 81 mg by mouth daily.    Marland Kitchen atorvastatin (LIPITOR) 40 MG tablet TAKE 1 TABLET BY MOUTH ONCE DAILY NEEDS  OFFICE  VISIT (Patient taking differently: Take 40 mg by mouth daily. ) 90 tablet 1  . Calcium Carb-Cholecalciferol (CALCIUM 600 + D PO) Take 2 tablets by mouth daily.    . cholecalciferol (VITAMIN D3) 25 MCG (1000 UNIT) tablet Take 1,000 Units by mouth daily.    Marland Kitchen docusate sodium (COLACE) 100 MG capsule Take 100 mg by mouth daily as needed for mild constipation or moderate constipation.     Marland Kitchen esomeprazole (NEXIUM) 20 MG capsule Take 20 mg by mouth daily.     . ferrous gluconate (FERGON) 324 MG tablet Take 324 mg by mouth at bedtime.     . fexofenadine (ALLEGRA) 180 MG tablet Take 180 mg by mouth daily.    . Glucosamine-Chondroitin-MSM-D3 TABS Take 2 tablets by mouth daily.    . isosorbide mononitrate (IMDUR) 30 MG 24 hr tablet Take 1 tablet (30 mg total) by mouth daily. 30 tablet 6  . losartan (COZAAR) 50 MG tablet Take 50 mg by mouth 2 (two) times daily.    . metFORMIN (GLUCOPHAGE) 500 MG tablet Take 500 mg by mouth daily.    . Multiple Vitamin (MULTIVITAMIN) tablet Take 1 tablet by mouth daily.    . nebivolol (BYSTOLIC) 10 MG tablet Take 1 tablet (10 mg total) by mouth daily. 30 tablet 6  . nitroGLYCERIN (NITROLINGUAL) 0.4 MG/SPRAY spray Place 1 spray under the tongue every 5 (five) minutes x 3 doses as needed for chest pain (if no relief after 3rd dose, proceed to the ED for an evaluation). 4.9 g 0  . Omega-3 Fatty Acids (FISH OIL) 1200 MG CAPS Take 1,200 mg by mouth 2 (two) times daily.      Current Facility-Administered Medications  Medication Dose Route Frequency Provider Last Rate  Last Admin  . sodium chloride flush (NS) 0.9 % injection 3 mL  3 mL Intravenous Q12H Branch, Alphonse Guild, MD       Allergies:  Sulfonamide derivatives   Social History: The patient  reports that she has never smoked. She has never used smokeless tobacco. She reports that she does not drink alcohol and does not use drugs.   Family History: The patient's family history includes CAD in an other family member; Cancer in her sister.   ROS:  Please see the history of present illness. Otherwise, complete review of systems is positive for none.  All other systems  are reviewed and negative.   Physical Exam: VS:  There were no vitals taken for this visit., BMI There is no height or weight on file to calculate BMI.  Wt Readings from Last 3 Encounters:  08/06/20 187 lb 12.8 oz (85.2 kg)  07/20/20 191 lb 12.8 oz (87 kg)  07/10/20 189 lb (85.7 kg)    General: WD BF in NAD Neck: Supple, no elevated JVP or carotid bruits, no thyromegaly. Lungs: Clear to auscultation, nonlabored breathing at rest. Cardiac: Regular rate and rhythm, no S3 or significant systolic murmur, no pericardial rub. Extremities: No  edema, femoral and distal pulses 2+ without bruits. Skin: Warm and dry. Musculoskeletal: No kyphosis. Neuropsychiatric: Alert and oriented x3, affect grossly appropriate.    Recent Labwork: 07/09/2020: BUN 17; Creatinine, Ser 0.69; Hemoglobin 14.9; Platelets 186; Potassium 4.2; Sodium 136     Component Value Date/Time   CHOL 136 08/17/2015 0412   TRIG 111 08/17/2015 0412   HDL 40 (L) 08/17/2015 0412   CHOLHDL 3.4 08/17/2015 0412   VLDL 22 08/17/2015 0412   LDLCALC 74 08/17/2015 0412    Other Studies Reviewed Today:  07/10/2020 LEFT HEART CATH AND CORONARY ANGIOGRAPHY  Conclusion   Severe two-vessel coronary artery disease involving the dominant right coronary and the previously stented mid to distal LAD which involves to moderate to large diagonals.  Left main is widely patent  LAD  contains segmental 60 to 70% mid stenosis proximal to a previously placed stent in the distal segment.  The stent jails a large and moderate sized second and third diagonals.  The third diagonal has proximal and mid significant stenoses.  LAD supplies collaterals to the right coronary via septal perforators and around the apex.  Circumflex gives origin to 2 obtuse marginal branches which are free of any significant obstruction.  The proximal circumflex contains eccentric 20% stenosis.  Ramus intermedius is large and widely patent  Right coronary is dominant and totally occluded in the mid to distal segment and receives collaterals from right to right and left to right.  Normal LV function.  LVEDP 8 mmHg.  EF 55%.  RECOMMENDATIONS:   We will discuss whether RCA is approachable by the CTO team.  Would up titrate nitrate therapy to improve angina.  If refractory symptoms, consider repeat coronary bypass to the second and third diagonals as well as the distal LAD.  Would also need to have LV branch of RCA and PDA grafted at the same time.  If only interventional approach is stenting of the mid LAD this would lead to a double layer of stent and will have limited long-term patency Diagnostic Dominance: Right    Assessment and Plan:   1. CAD in native artery with refractory class 3-4 angina despite optimal medical therapy Status post CABG x 1 2006 with  LIMA-LAD. LIMA atretic on follow up one year later likely due to poor target and intramyocardial course. S/p DES of LAD in 2007.  Prior cardiac cath in 2012 and 2016 showed patent LAD stent and normal appearing dominant RCA.    Catheterization 07/10/2020 with my interpretation as noted above.  She has failed medical therapy. We discussed extensively options for revascularization including CTO PCI versus redo CABG. I would favor aggressive attempts at revascularization with PCI. Redo CABG would carry higher risk given her age, DM and prior  surgery. Also LIMA has already failed and by prior op note LAD target was poor. I think PCI is a reasonable option with success rate  for the RCA of 75-80%. There appear to be antegrade and retrograde options. If RCA stent goes well then the LAD can also be addressed. If unable to open the RCA we could consider surgical consultation. We discussed PCI procedure and risks at length. The procedure and risks were reviewed including but not limited to death, myocardial infarction, stroke, arrythmias, perforation,  bleeding, transfusion, emergency surgery, dye allergy, or renal dysfunction. The patient voices understanding and is agreeable to proceed. Will plan procedure next week on Dec 8.    2. Essential hypertension, benign BP well controlled on current medical therapy. Continue Amlodipine 10 mg daily. Losartan 50 mg po bid.  3. Mixed hyperlipidemia Continue Atorvastatin 40 mg po daily.Labs from PCP 02/09/2020: TC 142, TG 125, HDL 48, LDL 72. Not at goal. May need to increase statin at follow up.  4. Type 2 diabetes mellitus with other circulatory complication, without long-term current use of insulin ([place]) Managed by PCP . Recent fasting glucose 125 mg /dl. Hgb A1c 6.3%.    Medication Adjustments/Labs and Tests Ordered: Current medicines are reviewed at length with the patient today.  Concerns regarding medicines are outlined above.   Disposition: as noted above  Signed, Vasco Chong [name] MD, [facility]   08/08/2020 4:57 PM

## 2020-08-08 NOTE — H&P (View-Only) (Signed)
Cardiology Office Note  Date: 08/08/2020   ID: Kimberly, Flowers 01/22/1941, MRN [id number]  PCP:  Kimberly Chroman, MD  Cardiologist:  Kimberly Dolly, MD Electrophysiologist:  None   Chief Complaint: Follow-up CAD, cardiac catheterization  History of Present Illness: Kimberly Flowers is a 79 y.o. female seen at the request of Dr Kimberly Flowers for consideration of CTO PCI of the RCA and PCI of the LAD She has  a history of CAD, HLD, PAD, palpitations, DM 2.   History of prior CABG in 2006 at [place]. I have carefully reviewed records available from [facility] and [facility]. She was diagnosed with CAD in 2006. Was felt to not be a good candidate for PCI due to bifurcation lesion in the LAD.  She was referred to CT surgery and underwent single vessel CABG with LIMA to the LAD. Op note indicates that the LAD had an intramyocardial course. It only emerged in the intraventricular groove 3 cm from the apex and was too small to graft at this point. The LIMA was tied into a "medial branch". She had recurrent symptoms and  Cardiac cath demonstrated  subsequent occlusion of LIMA. She did undergo stenting of the LAD with DES and POBA of the diagonal through the stent strut. There is mention of a second stent procedure in ?2008 but I find no documentation of this and review of subsequent angiograms show what appears to be a single stent in the mid LAD. She had repeat cardiac caths in 2012 at [facility] and in 2016 here showing a normal RCA and patent LAD stent.   Low risk nuclear stress 09/16/2019.  Over the past several months she has developed progressive anginal symptoms. This is described as mid substernal pain radiating into jaws and shoulders bilaterally. Associated with SOB. Relieved with sl Ntg but usually has to take 2. Pain has increased in severity and frequency. This led to repeat cardiac cath.   Cardiac catheterization on 07/10/2020 demonstrated severe two-vessel CAD involving dominant RCA and previously stented  mid  LAD.  Left main was widely patent.  I personally reviewed films. LAD with segmental 60 to 70% mid stenosis proximal to a previously placed stent in the mid vessel.  Stent jailed a fairly large second diagonal branch with a 50% stenosis at the ostium. The third diagonal branch is modest in size with severe disease in the proximal and mid vessel.  Circumflex gives origin to 2 obtuse marginal branches which are free of any significant obstruction.    RCA dominant and totally occluded in the mid to distal segment and receives collaterals from right to right and left to right.      Since then medical therapy titrated. She had  terrible headaches related to increased Imdur. Ranexa made her feel severely dizzy and she couldn't sleep. She is on therapeutic beta blocker therapy and high dose amlodipine. She reports her quality of life is now very poor.     Past Medical History:  Diagnosis Date  . Anemia   . Coronary atherosclerosis of native coronary artery    Previous PCI 2007-2008, Dr. Sharyon Cable  . Essential hypertension, benign   . Mixed hyperlipidemia   . PAD (peripheral artery disease) (HCC)     Absent right DP  . Palpitations   . Type 2 diabetes mellitus ([place])   . Vasomotor rhinitis     Past Surgical History:  Procedure Laterality Date  . ABDOMINAL HYSTERECTOMY    . CARDIAC CATHETERIZATION N/A 08/17/2015  Procedure: Left Heart Cath and Coronary Angiography;  Surgeon: Burnell Blanks, MD;  Location: [place] [facility];  Service: Cardiovascular;  Laterality: N/A;  . CORONARY ARTERY BYPASS GRAFT  2006   [name], single vessel  . LEFT HEART CATH AND CORONARY ANGIOGRAPHY N/A 07/10/2020   Procedure: LEFT HEART CATH AND CORONARY ANGIOGRAPHY;  Surgeon: [name], MD;  Location: [place] [facility];  Service: Cardiovascular;  Laterality: N/A;    Current Outpatient Medications  Medication Sig Dispense Refill  . acetaminophen (TYLENOL) 650 MG CR tablet Take 650-1,300 mg by mouth every  8 (eight) hours as needed for pain.    Marland Kitchen amLODipine (NORVASC) 10 MG tablet TAKE 1 TABLET BY MOUTH ONCE DAILY NEEDS  OFFICE  VISIT (Patient taking differently: Take 10 mg by mouth daily. ) 90 tablet 1  . Ascorbic Acid (VITAMIN C) 1000 MG tablet Take 1,000 mg by mouth daily.     Marland Kitchen aspirin 81 MG EC tablet Take 81 mg by mouth daily.    Marland Kitchen atorvastatin (LIPITOR) 40 MG tablet TAKE 1 TABLET BY MOUTH ONCE DAILY NEEDS  OFFICE  VISIT (Patient taking differently: Take 40 mg by mouth daily. ) 90 tablet 1  . Calcium Carb-Cholecalciferol (CALCIUM 600 + D PO) Take 2 tablets by mouth daily.    . cholecalciferol (VITAMIN D3) 25 MCG (1000 UNIT) tablet Take 1,000 Units by mouth daily.    Marland Kitchen docusate sodium (COLACE) 100 MG capsule Take 100 mg by mouth daily as needed for mild constipation or moderate constipation.     Marland Kitchen esomeprazole (NEXIUM) 20 MG capsule Take 20 mg by mouth daily.     . ferrous gluconate (FERGON) 324 MG tablet Take 324 mg by mouth at bedtime.     . fexofenadine (ALLEGRA) 180 MG tablet Take 180 mg by mouth daily.    . Glucosamine-Chondroitin-MSM-D3 TABS Take 2 tablets by mouth daily.    . isosorbide mononitrate (IMDUR) 30 MG 24 hr tablet Take 1 tablet (30 mg total) by mouth daily. 30 tablet 6  . losartan (COZAAR) 50 MG tablet Take 50 mg by mouth 2 (two) times daily.    . metFORMIN (GLUCOPHAGE) 500 MG tablet Take 500 mg by mouth daily.    . Multiple Vitamin (MULTIVITAMIN) tablet Take 1 tablet by mouth daily.    . nebivolol (BYSTOLIC) 10 MG tablet Take 1 tablet (10 mg total) by mouth daily. 30 tablet 6  . nitroGLYCERIN (NITROLINGUAL) 0.4 MG/SPRAY spray Place 1 spray under the tongue every 5 (five) minutes x 3 doses as needed for chest pain (if no relief after 3rd dose, proceed to the ED for an evaluation). 4.9 g 0  . Omega-3 Fatty Acids (FISH OIL) 1200 MG CAPS Take 1,200 mg by mouth 2 (two) times daily.      Current Facility-Administered Medications  Medication Dose Route Frequency Provider Last Rate  Last Admin  . sodium chloride flush (NS) 0.9 % injection 3 mL  3 mL Intravenous Q12H Branch, Alphonse Guild, MD       Allergies:  Sulfonamide derivatives   Social History: The patient  reports that she has never smoked. She has never used smokeless tobacco. She reports that she does not drink alcohol and does not use drugs.   Family History: The patient's family history includes CAD in an other family member; Cancer in her sister.   ROS:  Please see the history of present illness. Otherwise, complete review of systems is positive for none.  All other systems  are reviewed and negative.   Physical Exam: VS:  There were no vitals taken for this visit., BMI There is no height or weight on file to calculate BMI.  Wt Readings from Last 3 Encounters:  08/06/20 187 lb 12.8 oz (85.2 kg)  07/20/20 191 lb 12.8 oz (87 kg)  07/10/20 189 lb (85.7 kg)    General: WD BF in NAD Neck: Supple, no elevated JVP or carotid bruits, no thyromegaly. Lungs: Clear to auscultation, nonlabored breathing at rest. Cardiac: Regular rate and rhythm, no S3 or significant systolic murmur, no pericardial rub. Extremities: No  edema, femoral and distal pulses 2+ without bruits. Skin: Warm and dry. Musculoskeletal: No kyphosis. Neuropsychiatric: Alert and oriented x3, affect grossly appropriate.    Recent Labwork: 07/09/2020: BUN 17; Creatinine, Ser 0.69; Hemoglobin 14.9; Platelets 186; Potassium 4.2; Sodium 136     Component Value Date/Time   CHOL 136 08/17/2015 0412   TRIG 111 08/17/2015 0412   HDL 40 (L) 08/17/2015 0412   CHOLHDL 3.4 08/17/2015 0412   VLDL 22 08/17/2015 0412   LDLCALC 74 08/17/2015 0412    Other Studies Reviewed Today:  07/10/2020 LEFT HEART CATH AND CORONARY ANGIOGRAPHY  Conclusion   Severe two-vessel coronary artery disease involving the dominant right coronary and the previously stented mid to distal LAD which involves to moderate to large diagonals.  Left main is widely patent  LAD  contains segmental 60 to 70% mid stenosis proximal to a previously placed stent in the distal segment.  The stent jails a large and moderate sized second and third diagonals.  The third diagonal has proximal and mid significant stenoses.  LAD supplies collaterals to the right coronary via septal perforators and around the apex.  Circumflex gives origin to 2 obtuse marginal branches which are free of any significant obstruction.  The proximal circumflex contains eccentric 20% stenosis.  Ramus intermedius is large and widely patent  Right coronary is dominant and totally occluded in the mid to distal segment and receives collaterals from right to right and left to right.  Normal LV function.  LVEDP 8 mmHg.  EF 55%.  RECOMMENDATIONS:   We will discuss whether RCA is approachable by the CTO team.  Would up titrate nitrate therapy to improve angina.  If refractory symptoms, consider repeat coronary bypass to the second and third diagonals as well as the distal LAD.  Would also need to have LV branch of RCA and PDA grafted at the same time.  If only interventional approach is stenting of the mid LAD this would lead to a double layer of stent and will have limited long-term patency Diagnostic Dominance: Right    Assessment and Plan:   1. CAD in native artery with refractory class 3-4 angina despite optimal medical therapy Status post CABG x 1 2006 with  LIMA-LAD. LIMA atretic on follow up one year later likely due to poor target and intramyocardial course. S/p DES of LAD in 2007.  Prior cardiac cath in 2012 and 2016 showed patent LAD stent and normal appearing dominant RCA.    Catheterization 07/10/2020 with my interpretation as noted above.  She has failed medical therapy. We discussed extensively options for revascularization including CTO PCI versus redo CABG. I would favor aggressive attempts at revascularization with PCI. Redo CABG would carry higher risk given her age, DM and prior  surgery. Also LIMA has already failed and by prior op note LAD target was poor. I think PCI is a reasonable option with success rate  for the RCA of 75-80%. There appear to be antegrade and retrograde options. If RCA stent goes well then the LAD can also be addressed. If unable to open the RCA we could consider surgical consultation. We discussed PCI procedure and risks at length. The procedure and risks were reviewed including but not limited to death, myocardial infarction, stroke, arrythmias, perforation,  bleeding, transfusion, emergency surgery, dye allergy, or renal dysfunction. The patient voices understanding and is agreeable to proceed. Will plan procedure next week on Dec 8.    2. Essential hypertension, benign BP well controlled on current medical therapy. Continue Amlodipine 10 mg daily. Losartan 50 mg po bid.  3. Mixed hyperlipidemia Continue Atorvastatin 40 mg po daily.Labs from PCP 02/09/2020: TC 142, TG 125, HDL 48, LDL 72. Not at goal. May need to increase statin at follow up.  4. Type 2 diabetes mellitus with other circulatory complication, without long-term current use of insulin ([place]) Managed by PCP . Recent fasting glucose 125 mg /dl. Hgb A1c 6.3%.    Medication Adjustments/Labs and Tests Ordered: Current medicines are reviewed at length with the patient today.  Concerns regarding medicines are outlined above.   Disposition: as noted above  Signed, Daevon Holdren [name] MD, [facility]   08/08/2020 4:57 PM

## 2020-08-09 ENCOUNTER — Encounter: Payer: Self-pay | Admitting: Cardiology

## 2020-08-09 ENCOUNTER — Ambulatory Visit (INDEPENDENT_AMBULATORY_CARE_PROVIDER_SITE_OTHER): Payer: Medicare Other | Admitting: Cardiology

## 2020-08-09 ENCOUNTER — Other Ambulatory Visit: Payer: Self-pay

## 2020-08-09 ENCOUNTER — Other Ambulatory Visit: Payer: Self-pay | Admitting: Cardiology

## 2020-08-09 VITALS — BP 156/78 | HR 75 | Ht 65.5 in | Wt 188.4 lb

## 2020-08-09 DIAGNOSIS — I200 Unstable angina: Secondary | ICD-10-CM | POA: Diagnosis not present

## 2020-08-09 DIAGNOSIS — Z01812 Encounter for preprocedural laboratory examination: Secondary | ICD-10-CM

## 2020-08-09 DIAGNOSIS — I10 Essential (primary) hypertension: Secondary | ICD-10-CM | POA: Diagnosis not present

## 2020-08-09 DIAGNOSIS — I2510 Atherosclerotic heart disease of native coronary artery without angina pectoris: Secondary | ICD-10-CM

## 2020-08-09 LAB — CBC WITH DIFFERENTIAL/PLATELET
Basophils Absolute: 0.1 10*3/uL (ref 0.0–0.2)
Basos: 1 %
EOS (ABSOLUTE): 0.1 10*3/uL (ref 0.0–0.4)
Eos: 1 %
Hematocrit: 43.4 % (ref 34.0–46.6)
Hemoglobin: 15.1 g/dL (ref 11.1–15.9)
Immature Grans (Abs): 0 10*3/uL (ref 0.0–0.1)
Immature Granulocytes: 1 %
Lymphocytes Absolute: 1.4 10*3/uL (ref 0.7–3.1)
Lymphs: 25 %
MCH: 30 pg (ref 26.6–33.0)
MCHC: 34.8 g/dL (ref 31.5–35.7)
MCV: 86 fL (ref 79–97)
Monocytes Absolute: 0.5 10*3/uL (ref 0.1–0.9)
Monocytes: 10 %
Neutrophils Absolute: 3.4 10*3/uL (ref 1.4–7.0)
Neutrophils: 62 %
Platelets: 215 10*3/uL (ref 150–450)
RBC: 5.04 x10E6/uL (ref 3.77–5.28)
RDW: 12.6 % (ref 11.7–15.4)
WBC: 5.4 10*3/uL (ref 3.4–10.8)

## 2020-08-09 LAB — BASIC METABOLIC PANEL
BUN/Creatinine Ratio: 23 (ref 12–28)
BUN: 18 mg/dL (ref 8–27)
CO2: 22 mmol/L (ref 20–29)
Calcium: 9.1 mg/dL (ref 8.7–10.3)
Chloride: 103 mmol/L (ref 96–106)
Creatinine, Ser: 0.77 mg/dL (ref 0.57–1.00)
GFR calc Af Amer: 86 mL/min/{1.73_m2} (ref >59)
GFR calc non Af Amer: 75 mL/min/{1.73_m2} (ref >59)
Glucose: 119 mg/dL — ABNORMAL HIGH (ref 65–99)
Potassium: 4.7 mmol/L (ref 3.5–5.2)
Sodium: 139 mmol/L (ref 134–144)

## 2020-08-09 LAB — PT AND PTT
INR: 1 (ref 0.9–1.2)
Prothrombin Time: 10.9 s (ref 9.1–12.0)
aPTT: 26 s (ref 24–33)

## 2020-08-09 MED ORDER — CLOPIDOGREL BISULFATE 75 MG PO TABS: 75.0000 mg | ORAL_TABLET | Freq: Every day | ORAL | 3 refills | Status: DC

## 2020-08-09 MED ORDER — PANTOPRAZOLE SODIUM 40 MG PO TBEC: 40.0000 mg | DELAYED_RELEASE_TABLET | Freq: Every day | ORAL | 11 refills | Status: DC

## 2020-08-09 MED ORDER — SODIUM CHLORIDE 0.9% FLUSH: 3.0000 mL | Freq: Two times a day (BID) | INTRAVENOUS | Status: DC

## 2020-08-09 NOTE — Telephone Encounter (Signed)
Error

## 2020-08-09 NOTE — Patient Instructions (Addendum)
Stop taking Nexium. We will switch to Protonix 40 mg daily  Start on Plavix 75 mg daily.   Follow up appointment        Monday 09/03/20 at 4:20 pm    [place] [address] Dept: [phone] Loc: [place]  08/09/2020  You are scheduled for a CTO with PCI on Wednesday 08/15/20, with Dr.Cameran Ahmed.  1. Please arrive at the [facility] (Main Entrance A) at [facility]: [address] [place], [place] [address] at 9:30 am (This time is two hours before your procedure to ensure your preparation). Free valet parking service is available.   Special note: Every effort is made to have your procedure done on time. Please understand that emergencies sometimes delay scheduled procedures.  2. Diet: Do not eat solid foods after midnight.  The patient may have clear liquids until 5am upon the day of the procedure.  3. Labs: You will need to have blood drawn on Thursday 12/2 at [place] office You do not need to be fasting.      Covid Test  Saturday 08/11/20 at 12:00 noon [address] [employer].Quarantine until after cath.   4. Medication instructions in preparation for your procedure:     Hold Metformin morning of cath and 2 days after cath     On the morning of your procedure, take your Aspirin 81 mg and Plavix 75 mg and any morning medicines NOT listed above.  You may use sips of water.  5. Plan for one night stay--bring personal belongings. 6. Bring a current list of your medications and current insurance cards. 7. You MUST have a responsible person to drive you home. 8. Someone MUST be with you the first 24 hours after you arrive home or your discharge will be delayed. 9. Please wear clothes that are easy to get on and off and wear slip-on shoes.  Thank you for allowing [place] to care for you!   -- [facility]  [facility]

## 2020-08-11 ENCOUNTER — Other Ambulatory Visit (HOSPITAL_COMMUNITY)
Admission: RE | Admit: 2020-08-11 | Discharge: 2020-08-11 | Disposition: A | Discharge status: Home / Self Care | Payer: Medicare Other | Source: Ambulatory Visit | Attending: Cardiology | Admitting: Cardiology

## 2020-08-11 DIAGNOSIS — Z20822 Contact with and (suspected) exposure to covid-19: Secondary | ICD-10-CM | POA: Insufficient documentation

## 2020-08-11 DIAGNOSIS — Z01812 Encounter for preprocedural laboratory examination: Secondary | ICD-10-CM | POA: Diagnosis not present

## 2020-08-11 LAB — SARS CORONAVIRUS 2 (TAT 6-24 HRS): SARS Coronavirus 2: NEGATIVE

## 2020-08-14 ENCOUNTER — Telehealth: Payer: Self-pay | Admitting: *Deleted

## 2020-08-14 NOTE — Telephone Encounter (Signed)
Pt contacted pre-CTO scheduled at [facility] for: Wednesday August 15, 2020 8:30 AM Verified arrival time and place: [place] [facility]) at: 6:30 AM   No solid food after midnight prior to cath, clear liquids until 5 AM day of procedure.  Hold: Metformin-day of procedure and 48 hours post procedure  Except hold medications AM meds can be  taken pre-cath with sips of water including: ASA 81 mg Plavix 75 mg   Confirmed patient has responsible adult to drive home post procedure and be with patient first 24 hours after arriving home: yes  You are allowed ONE visitor in the waiting room during the time you are at the hospital for your procedure. Both you and your visitor must wear a mask once you enter the hospital.       COVID-19 Pre-Screening Questions:  . In the past 14 days have you had any symptoms concerning for COVID-19 infection (fever, chills, cough, or new shortness of breath)? no . In the past 14 days have you been around anyone with known Covid 19? No  Reviewed procedure/mask/visitor instructions, COVID-19 questions with patient.

## 2020-08-15 ENCOUNTER — Other Ambulatory Visit: Payer: Self-pay

## 2020-08-15 ENCOUNTER — Encounter (HOSPITAL_COMMUNITY)
Admission: RE | Disposition: A | Discharge status: Home / Self Care | Payer: Self-pay | Source: Home / Self Care | Attending: Cardiology

## 2020-08-15 ENCOUNTER — Ambulatory Visit (HOSPITAL_COMMUNITY)
Admission: RE | Admit: 2020-08-15 | Discharge: 2020-08-16 | Disposition: A | Discharge status: Home / Self Care | Payer: Medicare Other | Attending: Cardiology | Admitting: Cardiology

## 2020-08-15 DIAGNOSIS — E119 Type 2 diabetes mellitus without complications: Secondary | ICD-10-CM | POA: Diagnosis not present

## 2020-08-15 DIAGNOSIS — Z882 Allergy status to sulfonamides status: Secondary | ICD-10-CM | POA: Diagnosis not present

## 2020-08-15 DIAGNOSIS — Z7984 Long term (current) use of oral hypoglycemic drugs: Secondary | ICD-10-CM | POA: Diagnosis not present

## 2020-08-15 DIAGNOSIS — I2510 Atherosclerotic heart disease of native coronary artery without angina pectoris: Secondary | ICD-10-CM | POA: Diagnosis present

## 2020-08-15 DIAGNOSIS — I25119 Atherosclerotic heart disease of native coronary artery with unspecified angina pectoris: Secondary | ICD-10-CM | POA: Insufficient documentation

## 2020-08-15 DIAGNOSIS — Z951 Presence of aortocoronary bypass graft: Secondary | ICD-10-CM | POA: Insufficient documentation

## 2020-08-15 DIAGNOSIS — I10 Essential (primary) hypertension: Secondary | ICD-10-CM | POA: Diagnosis not present

## 2020-08-15 DIAGNOSIS — I2582 Chronic total occlusion of coronary artery: Secondary | ICD-10-CM | POA: Diagnosis not present

## 2020-08-15 DIAGNOSIS — E782 Mixed hyperlipidemia: Secondary | ICD-10-CM | POA: Diagnosis not present

## 2020-08-15 DIAGNOSIS — Z7982 Long term (current) use of aspirin: Secondary | ICD-10-CM | POA: Insufficient documentation

## 2020-08-15 DIAGNOSIS — Z955 Presence of coronary angioplasty implant and graft: Secondary | ICD-10-CM | POA: Diagnosis not present

## 2020-08-15 DIAGNOSIS — Z8249 Family history of ischemic heart disease and other diseases of the circulatory system: Secondary | ICD-10-CM | POA: Diagnosis not present

## 2020-08-15 DIAGNOSIS — Z79899 Other long term (current) drug therapy: Secondary | ICD-10-CM | POA: Insufficient documentation

## 2020-08-15 DIAGNOSIS — I200 Unstable angina: Secondary | ICD-10-CM

## 2020-08-15 HISTORY — PX: CORONARY CTO INTERVENTION: CATH118236

## 2020-08-15 HISTORY — PX: INTRAVASCULAR ULTRASOUND/IVUS: CATH118244

## 2020-08-15 HISTORY — PX: CORONARY STENT INTERVENTION: CATH118234

## 2020-08-15 LAB — POCT ACTIVATED CLOTTING TIME
Activated Clotting Time: 279 seconds
Activated Clotting Time: 350 seconds
Activated Clotting Time: 351 seconds
Activated Clotting Time: 428 seconds
Activated Clotting Time: 261 seconds
Activated Clotting Time: 291 seconds
Activated Clotting Time: 303 seconds
Activated Clotting Time: 220 seconds
Activated Clotting Time: 172 seconds

## 2020-08-15 LAB — GLUCOSE, CAPILLARY
Glucose-Capillary: 129 mg/dL — ABNORMAL HIGH (ref 70–99)
Glucose-Capillary: 127 mg/dL — ABNORMAL HIGH (ref 70–99)
Glucose-Capillary: 137 mg/dL — ABNORMAL HIGH (ref 70–99)

## 2020-08-15 SURGERY — CORONARY CTO INTERVENTION
Anesthesia: LOCAL

## 2020-08-15 MED ORDER — PANTOPRAZOLE SODIUM 40 MG PO TBEC
40.0000 mg | DELAYED_RELEASE_TABLET | Freq: Every day | ORAL | Status: DC
  Administered 2020-08-16: 40 mg via ORAL
  Filled 2020-08-15: qty 1

## 2020-08-15 MED ORDER — SODIUM CHLORIDE 0.9% FLUSH: 3.0000 mL | INTRAVENOUS | Status: DC | PRN

## 2020-08-15 MED ORDER — FENTANYL CITRATE (PF) 100 MCG/2ML IJ SOLN
50.0000 ug | Freq: Once | INTRAMUSCULAR | Status: AC
  Administered 2020-08-15: 50 ug via INTRAVENOUS

## 2020-08-15 MED ORDER — HEPARIN (PORCINE) IN NACL 1000-0.9 UT/500ML-% IV SOLN
INTRAVENOUS | Status: DC | PRN
  Administered 2020-08-15 (×3): 500 mL

## 2020-08-15 MED ORDER — ASCORBIC ACID 500 MG PO TABS
1000.0000 mg | ORAL_TABLET | Freq: Every day | ORAL | Status: DC
  Administered 2020-08-16: 1000 mg via ORAL
  Filled 2020-08-15: qty 2

## 2020-08-15 MED ORDER — SODIUM CHLORIDE 0.9 % IV SOLN
INTRAVENOUS | Status: AC
  Administered 2020-08-15: 75 mL/h via INTRAVENOUS

## 2020-08-15 MED ORDER — ISOSORBIDE MONONITRATE ER 30 MG PO TB24
30.0000 mg | ORAL_TABLET | Freq: Every day | ORAL | Status: DC
  Administered 2020-08-16: 30 mg via ORAL
  Filled 2020-08-15: qty 1

## 2020-08-15 MED ORDER — HYDRALAZINE HCL 20 MG/ML IJ SOLN: 10.0000 mg | INTRAMUSCULAR | Status: AC | PRN

## 2020-08-15 MED ORDER — CLOPIDOGREL BISULFATE 75 MG PO TABS
75.0000 mg | ORAL_TABLET | Freq: Every day | ORAL | Status: DC
  Administered 2020-08-16: 75 mg via ORAL
  Filled 2020-08-15: qty 1

## 2020-08-15 MED ORDER — SODIUM CHLORIDE 0.9 % WEIGHT BASED INFUSION
3.0000 mL/kg/h | INTRAVENOUS | Status: DC
  Administered 2020-08-15: 3 mL/kg/h via INTRAVENOUS

## 2020-08-15 MED ORDER — HEPARIN SODIUM (PORCINE) 1000 UNIT/ML IJ SOLN
INTRAMUSCULAR | Status: AC
  Filled 2020-08-15: qty 1

## 2020-08-15 MED ORDER — LORATADINE 10 MG PO TABS
10.0000 mg | ORAL_TABLET | Freq: Every day | ORAL | Status: DC
  Administered 2020-08-16: 10 mg via ORAL
  Filled 2020-08-15: qty 1

## 2020-08-15 MED ORDER — HEPARIN (PORCINE) IN NACL 1000-0.9 UT/500ML-% IV SOLN
INTRAVENOUS | Status: AC
  Filled 2020-08-15: qty 500

## 2020-08-15 MED ORDER — SODIUM CHLORIDE 0.9 % IV SOLN: 250.0000 mL | INTRAVENOUS | Status: DC | PRN

## 2020-08-15 MED ORDER — SODIUM CHLORIDE 0.9% FLUSH
3.0000 mL | Freq: Two times a day (BID) | INTRAVENOUS | Status: DC
  Administered 2020-08-16: 3 mL via INTRAVENOUS

## 2020-08-15 MED ORDER — IOHEXOL 350 MG/ML SOLN
INTRAVENOUS | Status: AC
  Filled 2020-08-15: qty 1

## 2020-08-15 MED ORDER — GLUCOSAMINE-CHONDROITIN-MSM-D3 PO TABS: 2.0000 | ORAL_TABLET | Freq: Every day | ORAL | Status: DC

## 2020-08-15 MED ORDER — SODIUM CHLORIDE 0.9 % WEIGHT BASED INFUSION
1.0000 mL/kg/h | INTRAVENOUS | Status: DC
  Administered 2020-08-15: 250 mL via INTRAVENOUS

## 2020-08-15 MED ORDER — MIDAZOLAM HCL 2 MG/2ML IJ SOLN
INTRAMUSCULAR | Status: DC | PRN
  Administered 2020-08-15: 2 mg via INTRAVENOUS
  Administered 2020-08-15 (×4): 1 mg via INTRAVENOUS

## 2020-08-15 MED ORDER — FENTANYL CITRATE (PF) 100 MCG/2ML IJ SOLN
INTRAMUSCULAR | Status: AC
  Filled 2020-08-15: qty 2

## 2020-08-15 MED ORDER — NITROGLYCERIN 0.4 MG/SPRAY TL SOLN: 1.0000 | Status: DC | PRN

## 2020-08-15 MED ORDER — NITROGLYCERIN 0.4 MG SL SUBL
SUBLINGUAL_TABLET | SUBLINGUAL | Status: AC
  Filled 2020-08-15: qty 1

## 2020-08-15 MED ORDER — ATORVASTATIN CALCIUM 40 MG PO TABS
40.0000 mg | ORAL_TABLET | Freq: Every evening | ORAL | Status: DC
  Administered 2020-08-15: 40 mg via ORAL
  Filled 2020-08-15: qty 1

## 2020-08-15 MED ORDER — MIDAZOLAM HCL 2 MG/2ML IJ SOLN
INTRAMUSCULAR | Status: AC
  Filled 2020-08-15: qty 2

## 2020-08-15 MED ORDER — IOHEXOL 350 MG/ML SOLN
INTRAVENOUS | Status: DC | PRN
  Administered 2020-08-15: 230 mL

## 2020-08-15 MED ORDER — LOSARTAN POTASSIUM 50 MG PO TABS
50.0000 mg | ORAL_TABLET | Freq: Two times a day (BID) | ORAL | Status: DC
  Administered 2020-08-15 – 2020-08-16 (×2): 50 mg via ORAL
  Filled 2020-08-15 (×2): qty 1

## 2020-08-15 MED ORDER — ASPIRIN 81 MG PO CHEW: 81.0000 mg | CHEWABLE_TABLET | Freq: Every day | ORAL | Status: DC

## 2020-08-15 MED ORDER — ONDANSETRON HCL 4 MG/2ML IJ SOLN: 4.0000 mg | Freq: Four times a day (QID) | INTRAMUSCULAR | Status: DC | PRN

## 2020-08-15 MED ORDER — NEBIVOLOL HCL 10 MG PO TABS
10.0000 mg | ORAL_TABLET | Freq: Every day | ORAL | Status: DC
  Administered 2020-08-16: 10 mg via ORAL
  Filled 2020-08-15: qty 1

## 2020-08-15 MED ORDER — AMLODIPINE BESYLATE 10 MG PO TABS: 10.0000 mg | ORAL_TABLET | Freq: Every evening | ORAL | Status: DC

## 2020-08-15 MED ORDER — DOCUSATE SODIUM 100 MG PO CAPS: 100.0000 mg | ORAL_CAPSULE | Freq: Every day | ORAL | Status: DC | PRN

## 2020-08-15 MED ORDER — ACETAMINOPHEN 325 MG PO TABS: 650.0000 mg | ORAL_TABLET | ORAL | Status: DC | PRN

## 2020-08-15 MED ORDER — CLOPIDOGREL BISULFATE 75 MG PO TABS: 75.0000 mg | ORAL_TABLET | Freq: Every day | ORAL | Status: DC

## 2020-08-15 MED ORDER — LABETALOL HCL 5 MG/ML IV SOLN: 10.0000 mg | INTRAVENOUS | Status: AC | PRN

## 2020-08-15 MED ORDER — LIDOCAINE HCL (PF) 1 % IJ SOLN
INTRAMUSCULAR | Status: DC | PRN
  Administered 2020-08-15 (×2): 20 mL

## 2020-08-15 MED ORDER — MORPHINE SULFATE (PF) 2 MG/ML IV SOLN: 2.0000 mg | Freq: Once | INTRAVENOUS | Status: DC | PRN

## 2020-08-15 MED ORDER — EYE WASH OPHTH SOLN: 1.0000 [drp] | OPHTHALMIC | Status: DC

## 2020-08-15 MED ORDER — NITROGLYCERIN 1 MG/10 ML FOR IR/CATH LAB
INTRA_ARTERIAL | Status: AC
  Filled 2020-08-15: qty 10

## 2020-08-15 MED ORDER — FENTANYL CITRATE (PF) 100 MCG/2ML IJ SOLN
INTRAMUSCULAR | Status: DC | PRN
  Administered 2020-08-15 (×5): 25 ug via INTRAVENOUS

## 2020-08-15 MED ORDER — HEPARIN SODIUM (PORCINE) 1000 UNIT/ML IJ SOLN
INTRAMUSCULAR | Status: DC | PRN
  Administered 2020-08-15: 3000 [IU] via INTRAVENOUS
  Administered 2020-08-15: 2000 [IU] via INTRAVENOUS
  Administered 2020-08-15: 8000 [IU] via INTRAVENOUS
  Administered 2020-08-15: 3000 [IU] via INTRAVENOUS
  Administered 2020-08-15: 4000 [IU] via INTRAVENOUS

## 2020-08-15 MED ORDER — SODIUM CHLORIDE 0.9% FLUSH: 3.0000 mL | Freq: Two times a day (BID) | INTRAVENOUS | Status: DC

## 2020-08-15 MED ORDER — VITAMIN D 25 MCG (1000 UNIT) PO TABS
1000.0000 [IU] | ORAL_TABLET | Freq: Every day | ORAL | Status: DC
  Administered 2020-08-16: 1000 [IU] via ORAL
  Filled 2020-08-15: qty 1

## 2020-08-15 MED ORDER — ASPIRIN EC 81 MG PO TBEC
81.0000 mg | DELAYED_RELEASE_TABLET | Freq: Every day | ORAL | Status: DC
  Administered 2020-08-16: 81 mg via ORAL
  Filled 2020-08-15: qty 1

## 2020-08-15 MED ORDER — FERROUS GLUCONATE 324 (38 FE) MG PO TABS
324.0000 mg | ORAL_TABLET | Freq: Every day | ORAL | Status: DC
  Administered 2020-08-15: 324 mg via ORAL
  Filled 2020-08-15 (×2): qty 1

## 2020-08-15 MED ORDER — ADULT MULTIVITAMIN W/MINERALS CH: 1.0000 | ORAL_TABLET | Freq: Every evening | ORAL | Status: DC

## 2020-08-15 MED ORDER — LIDOCAINE HCL (PF) 1 % IJ SOLN
INTRAMUSCULAR | Status: AC
  Filled 2020-08-15: qty 30

## 2020-08-15 MED ORDER — NITROGLYCERIN 1 MG/10 ML FOR IR/CATH LAB
INTRA_ARTERIAL | Status: DC | PRN
  Administered 2020-08-15: 200 ug

## 2020-08-15 MED ORDER — NITROGLYCERIN 0.4 MG SL SUBL
0.4000 mg | SUBLINGUAL_TABLET | SUBLINGUAL | Status: DC | PRN
  Administered 2020-08-15: 0.4 mg via SUBLINGUAL

## 2020-08-15 MED ORDER — ASPIRIN 81 MG PO CHEW: 81.0000 mg | CHEWABLE_TABLET | ORAL | Status: DC

## 2020-08-15 MED ORDER — METFORMIN HCL 500 MG PO TABS: 500.0000 mg | ORAL_TABLET | Freq: Every day | ORAL | Status: DC

## 2020-08-15 SURGICAL SUPPLY — 44 items
BALLN  ~~LOC~~ SAPPHIRE 4.5X18 (BALLOONS) ×1
BALLN EMERGE MR 2.0X20 (BALLOONS) ×2
BALLN EMERGE MR 3.0X20 (BALLOONS) ×2
BALLN ~~LOC~~ EMERGE MR 3.5X20 (BALLOONS) ×2
BALLN ~~LOC~~ SAPPHIRE 4.5X18 (BALLOONS) ×1
BALLOON EMERGE MR 2.0X20 (BALLOONS) ×1 IMPLANT
BALLOON EMERGE MR 3.0X20 (BALLOONS) ×1 IMPLANT
BALLOON ~~LOC~~ EMERGE MR 3.5X20 (BALLOONS) ×1 IMPLANT
BALLOON ~~LOC~~ SAPPHIRE 4.5X18 (BALLOONS) ×1 IMPLANT
CATH INFINITI 5 FR IM (CATHETERS) ×2 IMPLANT
CATH INFINITI 5FR JL4 (CATHETERS) ×2 IMPLANT
CATH INFINITI JR4 5F (CATHETERS) ×2 IMPLANT
CATH MACH1 8FR AL.75  90CM (CATHETERS) ×1
CATH MACH1 8FR AL.75 90CM (CATHETERS) ×1 IMPLANT
CATH MAMBA FLEX 135 (CATHETERS) ×2 IMPLANT
CATH OPTICROSS HD (CATHETERS) ×2 IMPLANT
CATH TELESCOPE 6F GEC (CATHETERS) ×2 IMPLANT
CATH TRAPPER 6-8F (CATHETERS) ×2 IMPLANT
CATH TURNPIKE 135CM (CATHETERS) ×2 IMPLANT
ELECT DEFIB PAD ADLT CADENCE (PAD) ×2 IMPLANT
KIT ENCORE 26 ADVANTAGE (KITS) ×2 IMPLANT
KIT HEART LEFT (KITS) ×2 IMPLANT
KIT HEMO VALVE WATCHDOG (MISCELLANEOUS) ×2 IMPLANT
PACK CARDIAC CATHETERIZATION (CUSTOM PROCEDURE TRAY) ×2 IMPLANT
SHEATH BRITE TIP 8FR 35CM (SHEATH) ×2 IMPLANT
SHEATH PINNACLE 5F 10CM (SHEATH) ×2 IMPLANT
SHEATH PROBE COVER 6X72 (BAG) ×2 IMPLANT
SLED PULL BACK IVUS (MISCELLANEOUS) ×2 IMPLANT
STENT SYNERGY XD 2.50X28 (Permanent Stent) ×1 IMPLANT
STENT SYNERGY XD 3.50X38 (Permanent Stent) ×1 IMPLANT
STENT SYNERGY XD 4.0X12 (Permanent Stent) ×1 IMPLANT
STENT SYNERGY XD 4.0X48 (Permanent Stent) ×1 IMPLANT
SYNERGY XD 2.50X28 (Permanent Stent) ×2 IMPLANT
SYNERGY XD 3.50X38 (Permanent Stent) ×2 IMPLANT
SYNERGY XD 4.0X12 (Permanent Stent) ×2 IMPLANT
SYNERGY XD 4.0X48 (Permanent Stent) ×2 IMPLANT
TRANSDUCER W/STOPCOCK (MISCELLANEOUS) ×2 IMPLANT
TUBING CIL FLEX 10 FLL-RA (TUBING) ×2 IMPLANT
WIRE ASAHI GRAND SLAM 180CM (WIRE) ×2 IMPLANT
WIRE ASAHI MIRACLEBROS-6 180CM (WIRE) ×2 IMPLANT
WIRE ASAHI PROWATER 180CM (WIRE) ×2 IMPLANT
WIRE EMERALD 3MM-J .035X150CM (WIRE) ×4 IMPLANT
WIRE FIGHTER CROSSING 190CM (WIRE) ×2 IMPLANT
WIRE HI TORQ PILOT-200 300CM (WIRE) ×2 IMPLANT

## 2020-08-15 NOTE — Progress Notes (Signed)
Site area: Right groin a 8 french arterial sheath was removed  Site Prior to Removal:  Level 1 bruise and <1cm hematoma  Pressure Applied For 30 MINUTES    Bedrest Beginning at 1715pm  Manual:   Yes.    Patient Status During Pull:  stable  Post Pull Groin Site:  Level 1 bruise  Post Pull Instructions Given:  Yes.    Post Pull Pulses Present:  Yes.    Dressing Applied:  Yes.    Comments:

## 2020-08-15 NOTE — Progress Notes (Signed)
Site area: Left groin a 5 french arterial sheath was removed  Site Prior to Removal:  Level 1 bruise  Pressure Applied For 30 MINUTES    Bedrest Beginning at 1715p  Manual:   Yes.    Patient Status During Pull:  stable  Post Pull Groin Site:  Level 1 bruise  Post Pull Instructions Given:  Yes.    Post Pull Pulses Present:  Yes.    Dressing Applied:  Yes.    Comments:

## 2020-08-15 NOTE — Progress Notes (Signed)
PHARMACIST - PHYSICIAN ORDER COMMUNICATION  CONCERNING: P&T Medication Policy on Herbal Medications  DESCRIPTION:  This patient's order for:  Glucosamine-Chondroitin-MSM-D3  has been noted.  This product(s) is classified as an "herbal" or natural product. Due to a lack of definitive safety studies or FDA approval, nonstandard manufacturing practices, plus the potential risk of unknown drug-drug interactions while on inpatient medications, the Pharmacy and Therapeutics Committee does not permit the use of "herbal" or natural products of this type within [facility].   ACTION TAKEN: The pharmacy department is unable to verify this order at this time and your patient has been informed of this safety policy. Please reevaluate patient's clinical condition at discharge and address if the herbal or natural product(s) should be resumed at that time.  Antonietta Jewel, PharmD, [place] Clinical Pharmacist  Phone: [phone] 08/15/2020 5:55 PM  Please check AMION for all [place] phone numbers After 10:00 PM, call [place] [phone]

## 2020-08-15 NOTE — Interval H&P Note (Signed)
History and Physical Interval Note:  08/15/2020 8:13 AM  [place]  has presented today for surgery, with the diagnosis of CAD.  The various methods of treatment have been discussed with the patient and family. After consideration of risks, benefits and other options for treatment, the patient has consented to  Procedure(s): CORONARY CTO INTERVENTION (N/A) CORONARY STENT INTERVENTION (N/A) as a surgical intervention.  The patient's history has been reviewed, patient examined, no change in status, stable for surgery.  I have reviewed the patient's chart and labs.  Questions were answered to the patient's satisfaction.   Cath Lab Visit (complete for each Cath Lab visit)  Clinical Evaluation Leading to the Procedure:   ACS: No.  Non-ACS:    Anginal Classification: CCS III  Anti-ischemic medical therapy: Maximal Therapy (2 or more classes of medications)  Non-Invasive Test Results: No non-invasive testing performed  Prior CABG: Previous CABG        Collier [facility] 08/15/2020 8:13 AM

## 2020-08-16 ENCOUNTER — Encounter (HOSPITAL_COMMUNITY): Payer: Self-pay | Admitting: Cardiology

## 2020-08-16 DIAGNOSIS — Z7984 Long term (current) use of oral hypoglycemic drugs: Secondary | ICD-10-CM | POA: Diagnosis not present

## 2020-08-16 DIAGNOSIS — I10 Essential (primary) hypertension: Secondary | ICD-10-CM | POA: Diagnosis not present

## 2020-08-16 DIAGNOSIS — Z955 Presence of coronary angioplasty implant and graft: Secondary | ICD-10-CM | POA: Diagnosis not present

## 2020-08-16 DIAGNOSIS — I25119 Atherosclerotic heart disease of native coronary artery with unspecified angina pectoris: Secondary | ICD-10-CM | POA: Diagnosis not present

## 2020-08-16 DIAGNOSIS — E782 Mixed hyperlipidemia: Secondary | ICD-10-CM | POA: Diagnosis not present

## 2020-08-16 DIAGNOSIS — Z7982 Long term (current) use of aspirin: Secondary | ICD-10-CM | POA: Diagnosis not present

## 2020-08-16 DIAGNOSIS — Z8249 Family history of ischemic heart disease and other diseases of the circulatory system: Secondary | ICD-10-CM | POA: Diagnosis not present

## 2020-08-16 DIAGNOSIS — Z951 Presence of aortocoronary bypass graft: Secondary | ICD-10-CM | POA: Diagnosis not present

## 2020-08-16 DIAGNOSIS — Z79899 Other long term (current) drug therapy: Secondary | ICD-10-CM | POA: Diagnosis not present

## 2020-08-16 DIAGNOSIS — I2582 Chronic total occlusion of coronary artery: Secondary | ICD-10-CM | POA: Diagnosis not present

## 2020-08-16 DIAGNOSIS — Z882 Allergy status to sulfonamides status: Secondary | ICD-10-CM | POA: Diagnosis not present

## 2020-08-16 DIAGNOSIS — E119 Type 2 diabetes mellitus without complications: Secondary | ICD-10-CM | POA: Diagnosis not present

## 2020-08-16 LAB — CBC
HCT: 36.3 % (ref 36.0–46.0)
Hemoglobin: 12.1 g/dL (ref 12.0–15.0)
MCH: 29.9 pg (ref 26.0–34.0)
MCHC: 33.3 g/dL (ref 30.0–36.0)
MCV: 89.6 fL (ref 80.0–100.0)
Platelets: 154 10*3/uL (ref 150–400)
RBC: 4.05 MIL/uL (ref 3.87–5.11)
RDW: 13.2 % (ref 11.5–15.5)
WBC: 6.6 10*3/uL (ref 4.0–10.5)
nRBC: 0 % (ref 0.0–0.2)

## 2020-08-16 LAB — BASIC METABOLIC PANEL
Anion gap: 10 (ref 5–15)
BUN: 15 mg/dL (ref 8–23)
CO2: 21 mmol/L — ABNORMAL LOW (ref 22–32)
Calcium: 8.7 mg/dL — ABNORMAL LOW (ref 8.9–10.3)
Chloride: 107 mmol/L (ref 98–111)
Creatinine, Ser: 0.64 mg/dL (ref 0.44–1.00)
GFR, Estimated: >60 mL/min (ref >60)
Glucose, Bld: 133 mg/dL — ABNORMAL HIGH (ref 70–99)
Potassium: 3.7 mmol/L (ref 3.5–5.1)
Sodium: 138 mmol/L (ref 135–145)

## 2020-08-16 LAB — GLUCOSE, CAPILLARY: Glucose-Capillary: 144 mg/dL — ABNORMAL HIGH (ref 70–99)

## 2020-08-16 NOTE — Progress Notes (Signed)
Patient reported 10/10 chest pain with radiation to jaw similar to before her cath procedure. On call provider paged, EKG completed. Vitals stable. PRN nitro ordered and given x1. Patient reported chest pain 3/10 after nitro and "subsiding." BP dropped to 110/50. Patient declined a 2nd nitro at this time. Per provider, will continue to monitor and get another EKG in two hours.

## 2020-08-16 NOTE — Discharge Summary (Signed)
Discharge Summary    Patient ID: Kimberly Flowers MRN: [id number]; DOB: 1941-02-05  Admit date: 08/15/2020 Discharge date: 08/16/2020  Primary Care Provider: [name], MD  Primary Cardiologist: [name], MD  Primary Electrophysiologist:  None   Discharge Diagnoses    Principal Problem:   Coronary artery disease involving native coronary artery of native heart with angina pectoris [facility]) Active Problems:   Mixed hyperlipidemia   Essential hypertension, benign   Coronary atherosclerosis of native coronary artery   T2DM (type 2 diabetes mellitus) ([place])    Diagnostic Studies/Procedures    Cath: 08/15/20   3rd Diag-1 lesion is 90% stenosed.  3rd Diag-2 lesion is 70% stenosed.  Mid LAD lesion is 65% stenosed.  Mid LAD to Dist LAD lesion is 25% stenosed.  2nd Diag lesion is 45% stenosed.  Mid RCA to Dist RCA lesion is 100% stenosed.  A drug-eluting stent was successfully placed using a SYNERGY XD 3.50X38.  Ost RCA to Mid RCA lesion is 80% stenosed.  A drug-eluting stent was successfully placed using a SYNERGY XD 4.0X48.  RPDA lesion is 90% stenosed.  A drug-eluting stent was successfully placed using a SYNERGY XD 2.50X28.  A drug-eluting stent was successfully placed using a SYNERGY XD 4.0X12.  Post intervention, there is a 0% residual stenosis.  Post intervention, there is a 0% residual stenosis.  Post intervention, there is a 0% residual stenosis.   1. Successful CTO PCI of the RCA from the proximal vessel into the PDA with DES x 4 and IVUS guidance.  Plan: DAPT indefinitely given extensive nature of stents. We will see how she does clinically. If she has persistent anginal symptoms we could address the stenosis in the LAD at a later time.   Diagnostic Dominance: Right    Intervention     _____________   History of Present Illness     Kimberly Flowers is a 79 y.o. female who was seen by Dr. [name] at the request of Dr Harl Bowie for  consideration of CTO PCI of the RCA and PCI of the LAD She has  a history of CAD, HLD, PAD, palpitations, DM 2.  History of prior CABG in 2006 at [facility]. Her records were reviewed which were available from [facility] and [facility] by Dr. [name]. She was diagnosed with CAD in 2006. Was felt to not be a good candidate for PCI due to bifurcation lesion in the LAD.  She was referred to CT surgery and underwent single vessel CABG with LIMA to the LAD. Op note indicates that the LAD had an intramyocardial course. It only emerged in the intraventricular groove 3 cm from the apex and was too small to graft at this point. The LIMA was tied into a "medial branch". She had recurrent symptoms and  Cardiac cath demonstrated  subsequent occlusion of LIMA. She did undergo stenting of the LAD with DES and POBA of the diagonal through the stent strut. There was mention of a second stent procedure in ?2008 but unable to find documentation of this and review of subsequent angiograms show what appeared to be a single stent in the mid LAD. She had repeat cardiac caths in 2012 at [facility] and in 2016 here showing a normal RCA and patent LAD stent.   Low risk nuclear stress 09/16/2019.  Over the past several months she has developed progressive anginal symptoms. This is described as mid substernal pain radiating into jaws and shoulders bilaterally. Associated with SOB. Relieved with sl Ntg but usually  has to take 2. Pain has increased in severity and frequency. This led to repeat cardiac cath.   Cardiac catheterization on 07/10/2020 demonstrated severe two-vessel CAD involving dominant RCA and previously stented mid  LAD.  Left main was widely patent.  I personally reviewed films. LAD with segmental 60 to 70% mid stenosis proximal to a previously placed stent in the mid vessel.  Stent jailed a fairly large second diagonal branch with a 50% stenosis at the ostium. The third diagonal branch is modest in size with severe disease in the proximal  and mid vessel.  Circumflex gives origin to 2 obtuse marginal branches which are free of any significant obstruction.    RCA dominant and totally occluded in the mid to distal segment and receives collaterals from right to right and left to right.     Since then medical therapy titrated. She had  terrible headaches related to increased Imdur. Ranexa made her feel severely dizzy and she couldn't sleep. She is on therapeutic beta blocker therapy and high dose amlodipine. She reported her quality of life is now very poor. She information was reviewed and determined to be a candidate for CTO of the RCA.   Hospital Course     1. CAD with refractory angina: underwent outpatient cardiac cath noted above with successful intervention of CTO of the RCA with DESx4 with IVUS guidance. No complications noted overnight. Did have some mild chest discomfort but resolved quickly and no recurrence. Plan to continue on DAPT with ASA/Plavix indefinitely with extensive stenting. Worked well with cardiac rehab without recurrent chest pain.   2. HTN: stable with therapy of Amlodipine 10mg  daily, bystolic 10mg  daily and losartan 50mg  daily  3. HLD: continue atorvastatin 40mg  daily  4. DM: plan to resume metformin 48hrs post cath   Did the patient have an acute coronary syndrome (MI, NSTEMI, STEMI, etc) this admission?:  No                               Did the patient have a percutaneous coronary intervention (stent / angioplasty)?:  Yes.     Cath/PCI Registry Performance & Quality Measures: 1. Aspirin prescribed? - Yes 2. ADP Receptor Inhibitor (Plavix/Clopidogrel, Brilinta/Ticagrelor or Effient/Prasugrel) prescribed (includes medically managed patients)? - Yes 3. High Intensity Statin (Lipitor 40-80mg  or Crestor 20-40mg ) prescribed? - Yes 4. For EF <40%, was ACEI/ARB prescribed? - Yes 5. For EF <40%, Aldosterone Antagonist (Spironolactone or Eplerenone) prescribed? - Not Applicable (EF >/= 94%) 6. Cardiac Rehab  Phase II ordered? - Yes       _____________  Discharge Vitals Blood pressure (!) 124/51, pulse 64, temperature 98.5 F (36.9 C), temperature source Oral, resp. rate 17, height 5' 5.5" (1.664 m), weight 85 kg, SpO2 95 %.  Filed Weights   08/15/20 0622 08/16/20 0640  Weight: 82.6 kg 85 kg    Labs & Radiologic Studies    CBC Recent Labs    08/16/20 0226  WBC 6.6  HGB 12.1  HCT 36.3  MCV 89.6  PLT 496   Basic Metabolic Panel Recent Labs    08/16/20 0226  NA 138  K 3.7  CL 107  CO2 21*  GLUCOSE 133*  BUN 15  CREATININE 0.64  CALCIUM 8.7*   Liver Function Tests No results for input(s): AST, ALT, ALKPHOS, BILITOT, PROT, ALBUMIN in the last 72 hours. No results for input(s): LIPASE, AMYLASE in the last 72 hours. High Sensitivity Troponin:  No results for input(s): TROPONINIHS in the last 720 hours.  BNP Invalid input(s): POCBNP D-Dimer No results for input(s): DDIMER in the last 72 hours. Hemoglobin A1C No results for input(s): HGBA1C in the last 72 hours. Fasting Lipid Panel No results for input(s): CHOL, HDL, LDLCALC, TRIG, CHOLHDL, LDLDIRECT in the last 72 hours. Thyroid Function Tests No results for input(s): TSH, T4TOTAL, T3FREE, THYROIDAB in the last 72 hours.  Invalid input(s): FREET3 _____________  CARDIAC CATHETERIZATION  Result Date: 08/15/2020  3rd Diag-1 lesion is 90% stenosed.  3rd Diag-2 lesion is 70% stenosed.  Mid LAD lesion is 65% stenosed.  Mid LAD to Dist LAD lesion is 25% stenosed.  2nd Diag lesion is 45% stenosed.  Mid RCA to Dist RCA lesion is 100% stenosed.  A drug-eluting stent was successfully placed using a SYNERGY XD 3.50X38.  Ost RCA to Mid RCA lesion is 80% stenosed.  A drug-eluting stent was successfully placed using a SYNERGY XD 4.0X48.  RPDA lesion is 90% stenosed.  A drug-eluting stent was successfully placed using a SYNERGY XD 2.50X28.  A drug-eluting stent was successfully placed using a SYNERGY XD 4.0X12.  Post  intervention, there is a 0% residual stenosis.  Post intervention, there is a 0% residual stenosis.  Post intervention, there is a 0% residual stenosis.  1. Successful CTO PCI of the RCA from the proximal vessel into the PDA with DES x 4 and IVUS guidance. Plan: DAPT indefinitely given extensive nature of stents. We will see how she does clinically. If she has persistent anginal symptoms we could address the stenosis in the LAD at a later time.   Disposition   Pt is being discharged home today in good condition.  Follow-up Plans & Appointments     Follow-up Information    [name], [name], MD Follow up on 09/03/2020.   Specialty: Cardiology Why: at 4:20pm for your follow up appt.  Contact information: [address] [place] [address] [phone]              Discharge Instructions    AMB Referral to Cardiac Rehabilitation - Phase II   Complete by: As directed    Diagnosis: Coronary Stents   After initial evaluation and assessments completed: Virtual Based Care may be provided alone or in conjunction with Phase 2 Cardiac Rehab based on patient barriers.: Yes   Diet - low sodium heart healthy   Complete by: As directed    Discharge instructions   Complete by: As directed    Groin Site Care Refer to this sheet in the next few weeks. These instructions provide you with information on caring for yourself after your procedure. Your caregiver may also give you more specific instructions. Your treatment has been planned according to current medical practices, but problems sometimes occur. Call your caregiver if you have any problems or questions after your procedure. HOME CARE INSTRUCTIONS You may shower 24 hours after the procedure. Remove the bandage (dressing) and gently wash the site with plain soap and water. Gently pat the site dry.  Do not apply powder or lotion to the site.  Do not sit in a bathtub, swimming pool, or whirlpool for 5 to 7 days.  No bending,  squatting, or lifting anything over 10 pounds (4.5 kg) as directed by your caregiver.  Inspect the site at least twice daily.  Do not drive home if you are discharged the same day of the procedure. Have someone else drive you.  You may drive  24 hours after the procedure unless otherwise instructed by your caregiver.  What to expect: Any bruising will usually fade within 1 to 2 weeks.  Blood that collects in the tissue (hematoma) may be painful to the touch. It should usually decrease in size and tenderness within 1 to 2 weeks.  SEEK IMMEDIATE MEDICAL CARE IF: You have unusual pain at the groin site or down the affected leg.  You have redness, warmth, swelling, or pain at the groin site.  You have drainage (other than a small amount of blood on the dressing).  You have chills.  You have a fever or persistent symptoms for more than 72 hours.  You have a fever and your symptoms suddenly get worse.  Your leg becomes pale, cool, tingly, or numb.  You have heavy bleeding from the site. Hold pressure on the site. .   Increase activity slowly   Complete by: As directed       Discharge Medications   Allergies as of 08/16/2020      Reactions   Ranolazine Er Other (See Comments)   Sulfonamide Derivatives    Unknown      Medication List    TAKE these medications   amLODipine 10 MG tablet Commonly known as: NORVASC TAKE 1 TABLET BY MOUTH ONCE DAILY NEEDS  OFFICE  VISIT What changed: See the new instructions.   aspirin 81 MG EC tablet Take 81 mg by mouth daily.   atorvastatin 40 MG tablet Commonly known as: LIPITOR TAKE 1 TABLET BY MOUTH ONCE DAILY NEEDS  OFFICE  VISIT What changed: See the new instructions.   CALCIUM 600 + D PO Take 2 tablets by mouth daily. In the morning   cholecalciferol 25 MCG (1000 UNIT) tablet Commonly known as: VITAMIN D3 Take 1,000 Units by mouth daily. In the morning   clopidogrel 75 MG tablet Commonly known as: PLAVIX Take 1 tablet (75 mg total) by  mouth daily.   docusate sodium 100 MG capsule Commonly known as: COLACE Take 100 mg by mouth daily as needed for mild constipation or moderate constipation.   eye wash Soln Place 1 drop into both eyes 3 (three) times a week.   ferrous gluconate 324 MG tablet Commonly known as: FERGON Take 324 mg by mouth at bedtime.   fexofenadine 180 MG tablet Commonly known as: ALLEGRA Take 180 mg by mouth daily. In the morning   Fish Oil 1200 MG Caps Take 1,200 mg by mouth at bedtime.   Glucosamine-Chondroitin-MSM-D3 Tabs Take 2 tablets by mouth daily.   isosorbide mononitrate 30 MG 24 hr tablet Commonly known as: IMDUR Take 1 tablet (30 mg total) by mouth daily.   losartan 50 MG tablet Commonly known as: COZAAR Take 50 mg by mouth 2 (two) times daily.   metFORMIN 500 MG tablet Commonly known as: GLUCOPHAGE Take 500 mg by mouth every evening.   multivitamin with minerals Tabs tablet Take 1 tablet by mouth every evening.   nebivolol 10 MG tablet Commonly known as: BYSTOLIC Take 1 tablet (10 mg total) by mouth daily.   nitroGLYCERIN 0.4 MG/SPRAY spray Commonly known as: NITROLINGUAL Place 1 spray under the tongue every 5 (five) minutes x 3 doses as needed for chest pain (if no relief after 3rd dose, proceed to the ED for an evaluation).   pantoprazole 40 MG tablet Commonly known as: PROTONIX Take 1 tablet (40 mg total) by mouth daily.   vitamin C 1000 MG tablet Take 1,000 mg by mouth daily.  In the morning          Outstanding Labs/Studies   N/a   Duration of Discharge Encounter   Greater than 30 minutes including physician time.  Signed, Reino Bellis, NP 08/16/2020, 10:22 AM   I have personally seen and examined this patient. I agree with the assessment and plan as outlined above.  She is doing well post PCI yesterday. No chest pain this am.  Will d/c home today on ASA and Plavix.   Lauree Chandler 08/16/2020 10:22 AM

## 2020-08-16 NOTE — Progress Notes (Signed)
CARDIAC REHAB PHASE I   PRE:  Rate/Rhythm: 71 SR with PACs    BP: sitting 125/55    SaO2: 94 RA  MODE:  Ambulation: 300 ft   POST:  Rate/Rhythm: 91 SR    BP: sitting 148/63     SaO2: 97 RA  Tolerated well, no CP although does admit to fatigue with distance. VSS. Reviewed education, pt very knowledgeable and compliant. Understands importance of Plavix, watches her diet, will begin walking again. Will refer to [facility]. [phone]   [place], ACSM 08/16/2020 10:22 AM

## 2020-08-17 ENCOUNTER — Telehealth: Payer: Self-pay | Admitting: Cardiology

## 2020-08-17 MED ORDER — AMLODIPINE BESYLATE 10 MG PO TABS
10.0000 mg | ORAL_TABLET | Freq: Every day | ORAL | 1 refills | Status: DC
Start: 2020-08-17 — End: 2020-12-27

## 2020-08-17 MED ORDER — ATORVASTATIN CALCIUM 40 MG PO TABS
40.0000 mg | ORAL_TABLET | Freq: Every day | ORAL | 1 refills | Status: DC
Start: 2020-08-17 — End: 2020-10-16

## 2020-08-17 NOTE — Telephone Encounter (Signed)
New message     *STAT* If patient is at the pharmacy, call can be transferred to refill team.   1. Which medications need to be refilled? (please list name of each medication and dose if known) amLODipine (NORVASC) 10 MG tablet atorvastatin (LIPITOR) 40 MG tablet  2. Which pharmacy/location (including street and city if local pharmacy) is medication to be sent to [facility] in [place]  3. Do they need a 30 day or 90 day supply?  [place]

## 2020-08-22 ENCOUNTER — Encounter: Payer: Medicare Other | Admitting: Surgery

## 2020-08-23 ENCOUNTER — Telehealth (HOSPITAL_COMMUNITY): Payer: Self-pay

## 2020-08-23 NOTE — Telephone Encounter (Signed)
Cardiac rehab referral for Ph.II faxed to [place]. 

## 2020-09-02 NOTE — Progress Notes (Signed)
Cardiology Office Note  Date: 09/03/2020   ID: Jamieka, Royle [birth year], MRN [id number]  PCP:  [name], MD  Cardiologist:  [name], MD Electrophysiologist:  None   Chief Complaint: Follow-up CAD, cardiac catheterization  History of Present Illness: Kimberly Flowers is a 79 y.o. female seen for follow up s/p CTO PCI of the RCA. She has  a history of CAD, HLD, PAD, palpitations, DM 2.   History of prior CABG in 2006 at [place]. I have carefully reviewed records available from [facility] and [facility]. She was diagnosed with CAD in 2006. Was felt to not be a good candidate for PCI due to bifurcation lesion in the LAD.  She was referred to CT surgery and underwent single vessel CABG with LIMA to the LAD. Op note indicates that the LAD had an intramyocardial course. It only emerged in the intraventricular groove 3 cm from the apex and was too small to graft at this point. The LIMA was tied into a "medial branch". She had recurrent symptoms and  Cardiac cath demonstrated  subsequent occlusion of LIMA. She did undergo stenting of the LAD with DES and POBA of the diagonal through the stent strut. There is mention of a second stent procedure in ?2008 but I find no documentation of this and review of subsequent angiograms show what appears to be a single stent in the mid LAD. She had repeat cardiac caths in 2012 at [facility] and in 2016 here showing a normal RCA and patent LAD stent.   Low risk nuclear stress 09/16/2019.  Over the past several months she has developed progressive anginal symptoms. This is described as mid substernal pain radiating into jaws and shoulders bilaterally. Associated with SOB. Relieved with sl Ntg but usually has to take 2. Pain has increased in severity and frequency. This led to repeat cardiac cath.   Cardiac catheterization on 07/10/2020 demonstrated severe two-vessel CAD involving dominant RCA and previously stented mid  LAD.  Left main was widely patent.  I  personally reviewed films. LAD with segmental 60 to 70% mid stenosis proximal to a previously placed stent in the mid vessel.  Stent jailed a fairly large second diagonal branch with a 50% stenosis at the ostium. The third diagonal branch is modest in size with severe disease in the proximal and mid vessel.  Circumflex gives origin to 2 obtuse marginal branches which are free of any significant obstruction.    RCA dominant and totally occluded in the mid to distal segment and receives collaterals from right to right and left to right.      Since then medical therapy titrated. She had  terrible headaches related to increased Imdur. Ranexa made her feel severely dizzy and she couldn't sleep. She is on therapeutic beta blocker therapy and high dose amlodipine. She reports her quality of life is now very poor.   Because she had persistent symptoms despite optimal medical therapy she underwent successful CTO PCI of the RCA on 08/15/20. We did not treat the LAD at the time due to the duration of procedure and contrast but it was felt this could be done later if symptoms persist.   On follow up she states she is doing much better. Her chest pain has improved significantly and has decreased in frequency and severity. She is still experiencing some angina and has used sl Ntg twice since DC. She has a small knot in her left groin, otherwise no hematoma.  Past Medical History:  Diagnosis Date  . Anemia   . Coronary atherosclerosis of native coronary artery    Previous PCI 2007-2008, Dr. Georganna Skeans  . Essential hypertension, benign   . Mixed hyperlipidemia   . PAD (peripheral artery disease) (HCC)     Absent right DP  . Palpitations   . Type 2 diabetes mellitus (HCC)   . Vasomotor rhinitis     Past Surgical History:  Procedure Laterality Date  . ABDOMINAL HYSTERECTOMY    . CARDIAC CATHETERIZATION N/A 08/17/2015   Procedure: Left Heart Cath and Coronary Angiography;  Surgeon: Kathleene Hazel, MD;   Location: [facility] INVASIVE CV LAB;  Service: Cardiovascular;  Laterality: N/A;  . CORONARY ARTERY BYPASS GRAFT  2006   [name], single vessel  . CORONARY CTO INTERVENTION N/A 08/15/2020   Procedure: CORONARY CTO INTERVENTION;  Surgeon: [name], Martine Trageser M, MD;  Location: [facility] INVASIVE CV LAB;  Service: Cardiovascular;  Laterality: N/A;  . CORONARY STENT INTERVENTION N/A 08/15/2020   Procedure: CORONARY STENT INTERVENTION;  Surgeon: [name], Sharetha Newson M, MD;  Location: [facility] INVASIVE CV LAB;  Service: Cardiovascular;  Laterality: N/A;  . INTRAVASCULAR ULTRASOUND/IVUS N/A 08/15/2020   Procedure: Intravascular Ultrasound/IVUS;  Surgeon: [name], Emslee Lopezmartinez M, MD;  Location: [facility] INVASIVE CV LAB;  Service: Cardiovascular;  Laterality: N/A;  . LEFT HEART CATH AND CORONARY ANGIOGRAPHY N/A 07/10/2020   Procedure: LEFT HEART CATH AND CORONARY ANGIOGRAPHY;  Surgeon: Lyn Records, MD;  Location: [facility] INVASIVE CV LAB;  Service: Cardiovascular;  Laterality: N/A;    Current Outpatient Medications  Medication Sig Dispense Refill  . amLODipine (NORVASC) 10 MG tablet Take 1 tablet (10 mg total) by mouth daily. 90 tablet 1  . Ascorbic Acid (VITAMIN C) 1000 MG tablet Take 1,000 mg by mouth daily. In the morning    . aspirin 81 MG EC tablet Take 81 mg by mouth daily.    Marland Kitchen atorvastatin (LIPITOR) 40 MG tablet Take 1 tablet (40 mg total) by mouth daily. 90 tablet 1  . Calcium Carb-Cholecalciferol (CALCIUM 600 + D PO) Take 2 tablets by mouth daily. In the morning    . cholecalciferol (VITAMIN D3) 25 MCG (1000 UNIT) tablet Take 1,000 Units by mouth daily. In the morning    . clopidogrel (PLAVIX) 75 MG tablet Take 1 tablet (75 mg total) by mouth daily. 90 tablet 3  . docusate sodium (COLACE) 100 MG capsule Take 100 mg by mouth daily as needed for mild constipation or moderate constipation.     Marland Kitchen eye wash (,SODIUM/POTASSIUM/SOD CHLORIDE,) SOLN Place 1 drop into both eyes 3 (three) times a week.    . ferrous gluconate (FERGON) 324 MG tablet Take  324 mg by mouth at bedtime.     . fexofenadine (ALLEGRA) 180 MG tablet Take 180 mg by mouth daily. In the morning    . Glucosamine-Chondroitin-MSM-D3 TABS Take 2 tablets by mouth daily.    . isosorbide mononitrate (IMDUR) 30 MG 24 hr tablet Take 1 tablet (30 mg total) by mouth daily. 30 tablet 6  . losartan (COZAAR) 50 MG tablet Take 50 mg by mouth 2 (two) times daily.    . metFORMIN (GLUCOPHAGE) 500 MG tablet Take 500 mg by mouth every evening.     . Multiple Vitamin (MULTIVITAMIN WITH MINERALS) TABS tablet Take 1 tablet by mouth every evening.    . nebivolol (BYSTOLIC) 10 MG tablet Take 1 tablet (10 mg total) by mouth daily. 30 tablet 6  . nitroGLYCERIN (NITROLINGUAL) 0.4 MG/SPRAY spray Place 1  spray under the tongue every 5 (five) minutes x 3 doses as needed for chest pain (if no relief after 3rd dose, proceed to the ED for an evaluation). 4.9 g 0  . Omega-3 Fatty Acids (FISH OIL) 1200 MG CAPS Take 1,200 mg by mouth at bedtime.     . pantoprazole (PROTONIX) 40 MG tablet Take 1 tablet (40 mg total) by mouth daily. 30 tablet 11   No current facility-administered medications for this visit.   Allergies:  Ranolazine er and Sulfonamide derivatives   Social History: The patient  reports that she has never smoked. She has never used smokeless tobacco. She reports that she does not drink alcohol and does not use drugs.   Family History: The patient's family history includes CAD in an other family member; Cancer in her sister.   ROS:  Please see the history of present illness. Otherwise, complete review of systems is positive for none.  All other systems are reviewed and negative.   Physical Exam: VS:  BP (!) 150/77   Pulse 67   Temp (!) 97 F (36.1 C)   Ht 5\' 5"  (1.651 m)   Wt 190 lb (86.2 kg)   SpO2 96%   BMI 31.62 kg/m , BMI Body mass index is 31.62 kg/m.  Wt Readings from Last 3 Encounters:  09/03/20 190 lb (86.2 kg)  08/16/20 187 lb 6.3 oz (85 kg)  08/09/20 188 lb 6.4 oz (85.5 kg)     General: WD BF in NAD Neck: Supple, no elevated JVP or carotid bruits, no thyromegaly. Lungs: Clear to auscultation, nonlabored breathing at rest. Cardiac: Regular rate and rhythm, no S3 or significant systolic murmur, no pericardial rub. Extremities: No  edema, femoral and distal pulses 2+ without bruits. Skin: Warm and dry. Musculoskeletal: No kyphosis. Neuropsychiatric: Alert and oriented x3, affect grossly appropriate.    Recent Labwork: 08/16/2020: BUN 15; Creatinine, Ser 0.64; Hemoglobin 12.1; Platelets 154; Potassium 3.7; Sodium 138     Component Value Date/Time   CHOL 136 08/17/2015 0412   TRIG 111 08/17/2015 0412   HDL 40 (L) 08/17/2015 0412   CHOLHDL 3.4 08/17/2015 0412   VLDL 22 08/17/2015 0412   LDLCALC 74 08/17/2015 0412    Dated 6//3/21: cholesterol 142, triglycerides 125, HDL 48, LDL 69. LFTs and TSH normal.  Other Studies Reviewed Today:  07/10/2020 LEFT HEART CATH AND CORONARY ANGIOGRAPHY  Conclusion   Severe two-vessel coronary artery disease involving the dominant right coronary and the previously stented mid to distal LAD which involves to moderate to large diagonals.  Left main is widely patent  LAD contains segmental 60 to 70% mid stenosis proximal to a previously placed stent in the distal segment.  The stent jails a large and moderate sized second and third diagonals.  The third diagonal has proximal and mid significant stenoses.  LAD supplies collaterals to the right coronary via septal perforators and around the apex.  Circumflex gives origin to 2 obtuse marginal branches which are free of any significant obstruction.  The proximal circumflex contains eccentric 20% stenosis.  Ramus intermedius is large and widely patent  Right coronary is dominant and totally occluded in the mid to distal segment and receives collaterals from right to right and left to right.  Normal LV function.  LVEDP 8 mmHg.  EF 55%.  RECOMMENDATIONS:   We will  discuss whether RCA is approachable by the CTO team.  Would up titrate nitrate therapy to improve angina.  If refractory symptoms, consider repeat  coronary bypass to the second and third diagonals as well as the distal LAD.  Would also need to have LV branch of RCA and PDA grafted at the same time.  If only interventional approach is stenting of the mid LAD this would lead to a double layer of stent and will have limited long-term patency Diagnostic Dominance: Right    CTO PCI 08/15/20:  CORONARY STENT INTERVENTION  CORONARY CTO INTERVENTION  Intravascular Ultrasound/IVUS    Conclusion    3rd Diag-1 lesion is 90% stenosed.  3rd Diag-2 lesion is 70% stenosed.  Mid LAD lesion is 65% stenosed.  Mid LAD to Dist LAD lesion is 25% stenosed.  2nd Diag lesion is 45% stenosed.  Mid RCA to Dist RCA lesion is 100% stenosed.  A drug-eluting stent was successfully placed using a SYNERGY XD 3.50X38.  Ost RCA to Mid RCA lesion is 80% stenosed.  A drug-eluting stent was successfully placed using a SYNERGY XD 4.0X48.  RPDA lesion is 90% stenosed.  A drug-eluting stent was successfully placed using a SYNERGY XD 2.50X28.  A drug-eluting stent was successfully placed using a SYNERGY XD 4.0X12.  Post intervention, there is a 0% residual stenosis.  Post intervention, there is a 0% residual stenosis.  Post intervention, there is a 0% residual stenosis.   1. Successful CTO PCI of the RCA from the proximal vessel into the PDA with DES x 4 and IVUS guidance.  Plan: DAPT indefinitely given extensive nature of stents. We will see how she does clinically. If she has persistent anginal symptoms we could address the stenosis in the LAD at a later time.     Assessment and Plan:   1. CAD in native artery . Now s/p CTO PCI of the RCA with improvement in functional class from 3-4 to 2.  Status post CABG x 1 2006 with  LIMA-LAD. LIMA atretic on follow up one year later likely due to poor  target and intramyocardial course. S/p DES of LAD in 2007.  Prior cardiac cath in 2012 and 2016 showed patent LAD stent and normal appearing dominant RCA.    Catheterization 07/10/2020 with my interpretation as noted above.  Now s/p CTO PCI of the RCA. She has residual stenosis in the LAD that appears suitable for PCI. We discussed a staged PCI of the LAD. She would like this if possible but wanted to wait a little longer to allow everything to heal. It was noted at the time of her last cath that the right radial artery was tiny and not suitable. She does have a good left radial pulse which may offer access for PCI of the LAD. Would assess with [place] at the time of procedure. Will plan on follow up in one month. Anticipate keeping on DAPT with ASA and Plavix indefinitely.    2. Essential hypertension, benign Continue Amlodipine 10 mg daily. Losartan 50 mg po bid.  3. Mixed hyperlipidemia Continue Atorvastatin 40 mg po daily.Labs from PCP 02/09/2020: TC 142, TG 125, HDL 48, LDL 72.   4. Type 2 diabetes mellitus with other circulatory complication, without long-term current use of insulin (HCC) Managed by PCP . Recent fasting glucose 125 mg /dl. Hgb A1c 6.3%.    Medication Adjustments/Labs and Tests Ordered: Current medicines are reviewed at length with the patient today.  Concerns regarding medicines are outlined above.   Disposition: as noted above  Signed, Jaymeson Mengel [name] MD, [facility]   09/03/2020 4:19 PM

## 2020-09-03 ENCOUNTER — Other Ambulatory Visit: Payer: Self-pay

## 2020-09-03 ENCOUNTER — Encounter: Payer: Self-pay | Admitting: Cardiology

## 2020-09-03 ENCOUNTER — Ambulatory Visit (INDEPENDENT_AMBULATORY_CARE_PROVIDER_SITE_OTHER): Payer: Medicare Other | Admitting: Cardiology

## 2020-09-03 VITALS — BP 150/77 | HR 67 | Temp 97.0°F | Ht 65.0 in | Wt 190.0 lb

## 2020-09-03 DIAGNOSIS — I25118 Atherosclerotic heart disease of native coronary artery with other forms of angina pectoris: Secondary | ICD-10-CM | POA: Diagnosis not present

## 2020-09-03 DIAGNOSIS — I10 Essential (primary) hypertension: Secondary | ICD-10-CM

## 2020-09-03 DIAGNOSIS — I200 Unstable angina: Secondary | ICD-10-CM

## 2020-09-03 DIAGNOSIS — E782 Mixed hyperlipidemia: Secondary | ICD-10-CM

## 2020-09-03 DIAGNOSIS — E1159 Type 2 diabetes mellitus with other circulatory complications: Secondary | ICD-10-CM

## 2020-10-01 NOTE — H&P (View-Only) (Signed)
  Cardiology Office Note  Date: 10/04/2020   ID: Kimberly Flowers, DOB 02/09/1941, MRN [id number]  PCP:  Vyas, Dhruv B, MD  Cardiologist:  Branch, Jonathan, MD Electrophysiologist:  None   Chief Complaint: Follow-up CAD, cardiac catheterization  History of Present Illness: Kimberly Flowers is a 79 y.o. female seen for follow up s/p CTO PCI of the RCA. She has  a history of CAD, HLD, PAD, palpitations, DM 2.   History of prior CABG in 2006 at [name]. I have carefully reviewed records available from [name] and [facility]. She was diagnosed with CAD in 2006. Was felt to not be a good candidate for PCI due to bifurcation lesion in the LAD.  She was referred to CT surgery and underwent single vessel CABG with LIMA to the LAD. Op note indicates that the LAD had an intramyocardial course. It only emerged in the intraventricular groove 3 cm from the apex and was too small to graft at this point. The LIMA was tied into a "medial branch". She had recurrent symptoms and  Cardiac cath demonstrated  subsequent occlusion of LIMA. She did undergo stenting of the LAD with DES and POBA of the diagonal through the stent strut. There is mention of a second stent procedure in ?2008 but I find no documentation of this and review of subsequent angiograms show what appears to be a single stent in the mid LAD. She had repeat cardiac caths in 2012 at [facility] and in 2016 here showing a normal RCA and patent LAD stent.   Low risk nuclear stress 09/16/2019.  Over the past several months she has developed progressive anginal symptoms. This is described as mid substernal pain radiating into jaws and shoulders bilaterally. Associated with SOB. Relieved with sl Ntg but usually has to take 2. Pain has increased in severity and frequency. This led to repeat cardiac cath.   Cardiac catheterization on 07/10/2020 demonstrated severe two-vessel CAD involving dominant RCA and previously stented mid  LAD.  Left main was widely patent.  I  personally reviewed films. LAD with segmental 60 to 70% mid stenosis proximal to a previously placed stent in the mid vessel.  Stent jailed a fairly large second diagonal branch with a 50% stenosis at the ostium. The third diagonal branch is modest in size with severe disease in the proximal and mid vessel.  Circumflex gives origin to 2 obtuse marginal branches which are free of any significant obstruction.    RCA dominant and totally occluded in the mid to distal segment and receives collaterals from right to right and left to right.      Since then medical therapy titrated. She had  terrible headaches related to increased Imdur. Ranexa made her feel severely dizzy and she couldn't sleep. She is on therapeutic beta blocker therapy and high dose amlodipine. She reports her quality of life is now very poor.   Because she had persistent symptoms despite optimal medical therapy she underwent successful CTO PCI of the RCA on 08/15/20. We did not treat the LAD at the time due to the duration of procedure and contrast but it was felt this could be done later if symptoms persist.   On follow up she states she is doing about the same. She is still having chest pain and uses Ntg about twice a week. Notes fatigue on exertion.  Some days she can be quite active but on others feels more fatigued.    Past Medical History:  Diagnosis Date  .   Anemia   . Coronary atherosclerosis of native coronary artery    Previous PCI 2007-2008, Dr. Sharyon Cable  . Essential hypertension, benign   . Mixed hyperlipidemia   . PAD (peripheral artery disease) (HCC)     Absent right DP  . Palpitations   . Type 2 diabetes mellitus ([place])   . Vasomotor rhinitis     Past Surgical History:  Procedure Laterality Date  . ABDOMINAL HYSTERECTOMY    . CARDIAC CATHETERIZATION N/A 08/17/2015   Procedure: Left Heart Cath and Coronary Angiography;  Surgeon: Burnell Blanks, MD;  Location: [place] [facility];  Service: Cardiovascular;   Laterality: N/A;  . CORONARY ARTERY BYPASS GRAFT  2006   [name], single vessel  . CORONARY CTO INTERVENTION N/A 08/15/2020   Procedure: CORONARY CTO INTERVENTION;  Surgeon: [name], Lielle Vandervort M, MD;  Location: [place] [facility];  Service: Cardiovascular;  Laterality: N/A;  . CORONARY STENT INTERVENTION N/A 08/15/2020   Procedure: CORONARY STENT INTERVENTION;  Surgeon: [name], Kaylla Cobos M, MD;  Location: [place] [facility];  Service: Cardiovascular;  Laterality: N/A;  . INTRAVASCULAR ULTRASOUND/IVUS N/A 08/15/2020   Procedure: Intravascular Ultrasound/IVUS;  Surgeon: [name], Oyinkansola Truax M, MD;  Location: [place] [facility];  Service: Cardiovascular;  Laterality: N/A;  . LEFT HEART CATH AND CORONARY ANGIOGRAPHY N/A 07/10/2020   Procedure: LEFT HEART CATH AND CORONARY ANGIOGRAPHY;  Surgeon: [name], MD;  Location: [place] [facility];  Service: Cardiovascular;  Laterality: N/A;    Current Outpatient Medications  Medication Sig Dispense Refill  . amLODipine (NORVASC) 10 MG tablet Take 1 tablet (10 mg total) by mouth daily. 90 tablet 1  . Ascorbic Acid (VITAMIN C) 1000 MG tablet Take 1,000 mg by mouth daily. In the morning    . aspirin 81 MG EC tablet Take 81 mg by mouth daily.    Marland Kitchen atorvastatin (LIPITOR) 40 MG tablet Take 1 tablet (40 mg total) by mouth daily. 90 tablet 1  . Calcium Carb-Cholecalciferol (CALCIUM 600 + D PO) Take 2 tablets by mouth daily. In the morning    . cholecalciferol (VITAMIN D3) 25 MCG (1000 UNIT) tablet Take 1,000 Units by mouth daily. In the morning    . clopidogrel (PLAVIX) 75 MG tablet Take 1 tablet (75 mg total) by mouth daily. 90 tablet 3  . docusate sodium (COLACE) 100 MG capsule Take 100 mg by mouth daily as needed for mild constipation or moderate constipation.     Marland Kitchen eye wash (,SODIUM/POTASSIUM/SOD CHLORIDE,) SOLN Place 1 drop into both eyes 3 (three) times a week.    . ferrous gluconate (FERGON) 324 MG tablet Take 324 mg by mouth at bedtime.     . fexofenadine (ALLEGRA)  180 MG tablet Take 180 mg by mouth daily. In the morning    . Glucosamine-Chondroitin-MSM-D3 TABS Take 2 tablets by mouth daily.    . isosorbide mononitrate (IMDUR) 30 MG 24 hr tablet Take 1 tablet (30 mg total) by mouth daily. 30 tablet 6  . losartan (COZAAR) 50 MG tablet Take 50 mg by mouth 2 (two) times daily.    . metFORMIN (GLUCOPHAGE) 500 MG tablet Take 500 mg by mouth every evening.     . Multiple Vitamin (MULTIVITAMIN WITH MINERALS) TABS tablet Take 1 tablet by mouth every evening.    . nebivolol (BYSTOLIC) 10 MG tablet Take 1 tablet (10 mg total) by mouth daily. 30 tablet 6  . nitroGLYCERIN (NITROLINGUAL) 0.4 MG/SPRAY spray Place 1 spray under the tongue every 5 (five) minutes  x 3 doses as needed for chest pain (if no relief after 3rd dose, proceed to the ED for an evaluation). 4.9 g 0  . Omega-3 Fatty Acids (FISH OIL) 1200 MG CAPS Take 1,200 mg by mouth at bedtime.     . pantoprazole (PROTONIX) 40 MG tablet Take 1 tablet (40 mg total) by mouth daily. 30 tablet 11   No current facility-administered medications for this visit.   Allergies:  Ranolazine er and Sulfonamide derivatives   Social History: The patient  reports that she has never smoked. She has never used smokeless tobacco. She reports that she does not drink alcohol and does not use drugs.   Family History: The patient's family history includes CAD in an other family member; Cancer in her sister.   ROS:  Please see the history of present illness. Otherwise, complete review of systems is positive for none.  All other systems are reviewed and negative.   Physical Exam: VS:  BP 95/60 (BP Location: Left Arm, Patient Position: Sitting)   Pulse 61   Ht 5' 5.5" (1.664 m)   Wt 185 lb (83.9 kg)   SpO2 97%   BMI 30.32 kg/m , BMI Body mass index is 30.32 kg/m.  Wt Readings from Last 3 Encounters:  10/04/20 185 lb (83.9 kg)  09/03/20 190 lb (86.2 kg)  08/16/20 187 lb 6.3 oz (85 kg)    General: WD BF in NAD Neck: Supple, no  elevated JVP or carotid bruits, no thyromegaly. Lungs: Clear to auscultation, nonlabored breathing at rest. Cardiac: Regular rate and rhythm, no S3 or significant systolic murmur, no pericardial rub. Extremities: No  edema, femoral and distal pulses 2+ without bruits. Skin: Warm and dry. Musculoskeletal: No kyphosis. Neuropsychiatric: Alert and oriented x3, affect grossly appropriate.    Recent Labwork: 08/16/2020: BUN 15; Creatinine, Ser 0.64; Hemoglobin 12.1; Platelets 154; Potassium 3.7; Sodium 138     Component Value Date/Time   CHOL 136 08/17/2015 0412   TRIG 111 08/17/2015 0412   HDL 40 (L) 08/17/2015 0412   CHOLHDL 3.4 08/17/2015 0412   VLDL 22 08/17/2015 0412   LDLCALC 74 08/17/2015 0412    Dated 6//3/21: cholesterol 142, triglycerides 125, HDL 48, LDL 69. LFTs and TSH normal.  Other Studies Reviewed Today:  07/10/2020 LEFT HEART CATH AND CORONARY ANGIOGRAPHY  Conclusion   Severe two-vessel coronary artery disease involving the dominant right coronary and the previously stented mid to distal LAD which involves to moderate to large diagonals.  Left main is widely patent  LAD contains segmental 60 to 70% mid stenosis proximal to a previously placed stent in the distal segment.  The stent jails a large and moderate sized second and third diagonals.  The third diagonal has proximal and mid significant stenoses.  LAD supplies collaterals to the right coronary via septal perforators and around the apex.  Circumflex gives origin to 2 obtuse marginal branches which are free of any significant obstruction.  The proximal circumflex contains eccentric 20% stenosis.  Ramus intermedius is large and widely patent  Right coronary is dominant and totally occluded in the mid to distal segment and receives collaterals from right to right and left to right.  Normal LV function.  LVEDP 8 mmHg.  EF 55%.  RECOMMENDATIONS:   We will discuss whether RCA is approachable by the CTO  team.  Would up titrate nitrate therapy to improve angina.  If refractory symptoms, consider repeat coronary bypass to the second and third diagonals as well as the  distal LAD.  Would also need to have LV branch of RCA and PDA grafted at the same time.  If only interventional approach is stenting of the mid LAD this would lead to a double layer of stent and will have limited long-term patency Diagnostic Dominance: Right    CTO PCI 08/15/20:  CORONARY STENT INTERVENTION  CORONARY CTO INTERVENTION  Intravascular Ultrasound/IVUS    Conclusion    3rd Diag-1 lesion is 90% stenosed.  3rd Diag-2 lesion is 70% stenosed.  Mid LAD lesion is 65% stenosed.  Mid LAD to Dist LAD lesion is 25% stenosed.  2nd Diag lesion is 45% stenosed.  Mid RCA to Dist RCA lesion is 100% stenosed.  A drug-eluting stent was successfully placed using a SYNERGY XD 3.50X38.  Ost RCA to Mid RCA lesion is 80% stenosed.  A drug-eluting stent was successfully placed using a SYNERGY XD 4.0X48.  RPDA lesion is 90% stenosed.  A drug-eluting stent was successfully placed using a SYNERGY XD 2.50X28.  A drug-eluting stent was successfully placed using a SYNERGY XD 4.0X12.  Post intervention, there is a 0% residual stenosis.  Post intervention, there is a 0% residual stenosis.  Post intervention, there is a 0% residual stenosis.   1. Successful CTO PCI of the RCA from the proximal vessel into the PDA with DES x 4 and IVUS guidance.  Plan: DAPT indefinitely given extensive nature of stents. We will see how she does clinically. If she has persistent anginal symptoms we could address the stenosis in the LAD at a later time.     Assessment and Plan:   1. CAD in native artery . Now s/p CTO PCI of the RCA with improvement in functional class from 3-4 to 2.  Status post CABG x 1 2006 with  LIMA-LAD. LIMA atretic on follow up one year later likely due to poor target and intramyocardial course. S/p DES of LAD  in 2007.  Prior cardiac cath in 2012 and 2016 showed patent LAD stent and normal appearing dominant RCA.    Catheterization 07/10/2020 with my interpretation as noted above.  Now s/p CTO PCI of the RCA. She has residual stenosis in the LAD that appears suitable for PCI. We discussed proceeding with PCI of the LAD at this point. It was noted at the time of her last cath that the right radial artery was tiny and not suitable for access. She does have a good left radial pulse which may offer access for PCI of the LAD. Would assess with [place] at the time of procedure.  Anticipate keeping on DAPT with ASA and Plavix indefinitely. Will arrange for PCI LAD on February 10. The procedure and risks were reviewed including but not limited to death, myocardial infarction, stroke, arrythmias, bleeding, transfusion, emergency surgery, dye allergy, or renal dysfunction. The patient voices understanding and is agreeable to proceed.   2. Essential hypertension, benign Continue Amlodipine 10 mg daily. Losartan 50 mg po bid.  3. Mixed hyperlipidemia Continue Atorvastatin 40 mg po daily.Labs from PCP 02/09/2020: TC 142, TG 125, HDL 48, LDL 72. Will update labs.  4. Type 2 diabetes mellitus with other circulatory complication, without long-term current use of insulin ([place]) Managed by PCP .    Medication Adjustments/Labs and Tests Ordered: Current medicines are reviewed at length with the patient today.  Concerns regarding medicines are outlined above.   Disposition: as noted above  Signed, Mirca Yale [name] MD, [facility]   10/04/2020 12:09 PM

## 2020-10-01 NOTE — Progress Notes (Signed)
Cardiology Office Note  Date: 10/04/2020   ID: Aashka, Gaschler 04/18/1941, MRN [id number]  PCP:  Ignatius Specking, MD  Cardiologist:  Dina Rich, MD Electrophysiologist:  None   Chief Complaint: Follow-up CAD, cardiac catheterization  History of Present Illness: Kimberly Flowers is a 80 y.o. female seen for follow up s/p CTO PCI of the RCA. She has  a history of CAD, HLD, PAD, palpitations, DM 2.   History of prior CABG in 2006 at [place]. I have carefully reviewed records available from [facility] and [facility]. She was diagnosed with CAD in 2006. Was felt to not be a good candidate for PCI due to bifurcation lesion in the LAD.  She was referred to CT surgery and underwent single vessel CABG with LIMA to the LAD. Op note indicates that the LAD had an intramyocardial course. It only emerged in the intraventricular groove 3 cm from the apex and was too small to graft at this point. The LIMA was tied into a "medial branch". She had recurrent symptoms and  Cardiac cath demonstrated  subsequent occlusion of LIMA. She did undergo stenting of the LAD with DES and POBA of the diagonal through the stent strut. There is mention of a second stent procedure in ?2008 but I find no documentation of this and review of subsequent angiograms show what appears to be a single stent in the mid LAD. She had repeat cardiac caths in 2012 at [facility] and in 2016 here showing a normal RCA and patent LAD stent.   Low risk nuclear stress 09/16/2019.  Over the past several months she has developed progressive anginal symptoms. This is described as mid substernal pain radiating into jaws and shoulders bilaterally. Associated with SOB. Relieved with sl Ntg but usually has to take 2. Pain has increased in severity and frequency. This led to repeat cardiac cath.   Cardiac catheterization on 07/10/2020 demonstrated severe two-vessel CAD involving dominant RCA and previously stented mid  LAD.  Left main was widely patent.  I  personally reviewed films. LAD with segmental 60 to 70% mid stenosis proximal to a previously placed stent in the mid vessel.  Stent jailed a fairly large second diagonal branch with a 50% stenosis at the ostium. The third diagonal branch is modest in size with severe disease in the proximal and mid vessel.  Circumflex gives origin to 2 obtuse marginal branches which are free of any significant obstruction.    RCA dominant and totally occluded in the mid to distal segment and receives collaterals from right to right and left to right.      Since then medical therapy titrated. She had  terrible headaches related to increased Imdur. Ranexa made her feel severely dizzy and she couldn't sleep. She is on therapeutic beta blocker therapy and high dose amlodipine. She reports her quality of life is now very poor.   Because she had persistent symptoms despite optimal medical therapy she underwent successful CTO PCI of the RCA on 08/15/20. We did not treat the LAD at the time due to the duration of procedure and contrast but it was felt this could be done later if symptoms persist.   On follow up she states she is doing about the same. She is still having chest pain and uses Ntg about twice a week. Notes fatigue on exertion.  Some days she can be quite active but on others feels more fatigued.    Past Medical History:  Diagnosis Date  .  Anemia   . Coronary atherosclerosis of native coronary artery    Previous PCI 2007-2008, Dr. Sharyon Cable  . Essential hypertension, benign   . Mixed hyperlipidemia   . PAD (peripheral artery disease) (HCC)     Absent right DP  . Palpitations   . Type 2 diabetes mellitus ([place])   . Vasomotor rhinitis     Past Surgical History:  Procedure Laterality Date  . ABDOMINAL HYSTERECTOMY    . CARDIAC CATHETERIZATION N/A 08/17/2015   Procedure: Left Heart Cath and Coronary Angiography;  Surgeon: Burnell Blanks, MD;  Location: [place] [facility];  Service: Cardiovascular;   Laterality: N/A;  . CORONARY ARTERY BYPASS GRAFT  2006   [name], single vessel  . CORONARY CTO INTERVENTION N/A 08/15/2020   Procedure: CORONARY CTO INTERVENTION;  Surgeon: [name], Rayssa Atha M, MD;  Location: [place] [facility];  Service: Cardiovascular;  Laterality: N/A;  . CORONARY STENT INTERVENTION N/A 08/15/2020   Procedure: CORONARY STENT INTERVENTION;  Surgeon: [name], Rashada Klontz M, MD;  Location: [place] [facility];  Service: Cardiovascular;  Laterality: N/A;  . INTRAVASCULAR ULTRASOUND/IVUS N/A 08/15/2020   Procedure: Intravascular Ultrasound/IVUS;  Surgeon: [name], Reeya Bound M, MD;  Location: [place] [facility];  Service: Cardiovascular;  Laterality: N/A;  . LEFT HEART CATH AND CORONARY ANGIOGRAPHY N/A 07/10/2020   Procedure: LEFT HEART CATH AND CORONARY ANGIOGRAPHY;  Surgeon: [name], MD;  Location: [place] [facility];  Service: Cardiovascular;  Laterality: N/A;    Current Outpatient Medications  Medication Sig Dispense Refill  . amLODipine (NORVASC) 10 MG tablet Take 1 tablet (10 mg total) by mouth daily. 90 tablet 1  . Ascorbic Acid (VITAMIN C) 1000 MG tablet Take 1,000 mg by mouth daily. In the morning    . aspirin 81 MG EC tablet Take 81 mg by mouth daily.    Marland Kitchen atorvastatin (LIPITOR) 40 MG tablet Take 1 tablet (40 mg total) by mouth daily. 90 tablet 1  . Calcium Carb-Cholecalciferol (CALCIUM 600 + D PO) Take 2 tablets by mouth daily. In the morning    . cholecalciferol (VITAMIN D3) 25 MCG (1000 UNIT) tablet Take 1,000 Units by mouth daily. In the morning    . clopidogrel (PLAVIX) 75 MG tablet Take 1 tablet (75 mg total) by mouth daily. 90 tablet 3  . docusate sodium (COLACE) 100 MG capsule Take 100 mg by mouth daily as needed for mild constipation or moderate constipation.     Marland Kitchen eye wash (,SODIUM/POTASSIUM/SOD CHLORIDE,) SOLN Place 1 drop into both eyes 3 (three) times a week.    . ferrous gluconate (FERGON) 324 MG tablet Take 324 mg by mouth at bedtime.     . fexofenadine (ALLEGRA)  180 MG tablet Take 180 mg by mouth daily. In the morning    . Glucosamine-Chondroitin-MSM-D3 TABS Take 2 tablets by mouth daily.    . isosorbide mononitrate (IMDUR) 30 MG 24 hr tablet Take 1 tablet (30 mg total) by mouth daily. 30 tablet 6  . losartan (COZAAR) 50 MG tablet Take 50 mg by mouth 2 (two) times daily.    . metFORMIN (GLUCOPHAGE) 500 MG tablet Take 500 mg by mouth every evening.     . Multiple Vitamin (MULTIVITAMIN WITH MINERALS) TABS tablet Take 1 tablet by mouth every evening.    . nebivolol (BYSTOLIC) 10 MG tablet Take 1 tablet (10 mg total) by mouth daily. 30 tablet 6  . nitroGLYCERIN (NITROLINGUAL) 0.4 MG/SPRAY spray Place 1 spray under the tongue every 5 (five) minutes  x 3 doses as needed for chest pain (if no relief after 3rd dose, proceed to the ED for an evaluation). 4.9 g 0  . Omega-3 Fatty Acids (FISH OIL) 1200 MG CAPS Take 1,200 mg by mouth at bedtime.     . pantoprazole (PROTONIX) 40 MG tablet Take 1 tablet (40 mg total) by mouth daily. 30 tablet 11   No current facility-administered medications for this visit.   Allergies:  Ranolazine er and Sulfonamide derivatives   Social History: The patient  reports that she has never smoked. She has never used smokeless tobacco. She reports that she does not drink alcohol and does not use drugs.   Family History: The patient's family history includes CAD in an other family member; Cancer in her sister.   ROS:  Please see the history of present illness. Otherwise, complete review of systems is positive for none.  All other systems are reviewed and negative.   Physical Exam: VS:  BP 95/60 (BP Location: Left Arm, Patient Position: Sitting)   Pulse 61   Ht 5' 5.5" (1.664 m)   Wt 185 lb (83.9 kg)   SpO2 97%   BMI 30.32 kg/m , BMI Body mass index is 30.32 kg/m.  Wt Readings from Last 3 Encounters:  10/04/20 185 lb (83.9 kg)  09/03/20 190 lb (86.2 kg)  08/16/20 187 lb 6.3 oz (85 kg)    General: WD BF in NAD Neck: Supple, no  elevated JVP or carotid bruits, no thyromegaly. Lungs: Clear to auscultation, nonlabored breathing at rest. Cardiac: Regular rate and rhythm, no S3 or significant systolic murmur, no pericardial rub. Extremities: No  edema, femoral and distal pulses 2+ without bruits. Skin: Warm and dry. Musculoskeletal: No kyphosis. Neuropsychiatric: Alert and oriented x3, affect grossly appropriate.    Recent Labwork: 08/16/2020: BUN 15; Creatinine, Ser 0.64; Hemoglobin 12.1; Platelets 154; Potassium 3.7; Sodium 138     Component Value Date/Time   CHOL 136 08/17/2015 0412   TRIG 111 08/17/2015 0412   HDL 40 (L) 08/17/2015 0412   CHOLHDL 3.4 08/17/2015 0412   VLDL 22 08/17/2015 0412   LDLCALC 74 08/17/2015 0412    Dated 6//3/21: cholesterol 142, triglycerides 125, HDL 48, LDL 69. LFTs and TSH normal.  Other Studies Reviewed Today:  07/10/2020 LEFT HEART CATH AND CORONARY ANGIOGRAPHY  Conclusion   Severe two-vessel coronary artery disease involving the dominant right coronary and the previously stented mid to distal LAD which involves to moderate to large diagonals.  Left main is widely patent  LAD contains segmental 60 to 70% mid stenosis proximal to a previously placed stent in the distal segment.  The stent jails a large and moderate sized second and third diagonals.  The third diagonal has proximal and mid significant stenoses.  LAD supplies collaterals to the right coronary via septal perforators and around the apex.  Circumflex gives origin to 2 obtuse marginal branches which are free of any significant obstruction.  The proximal circumflex contains eccentric 20% stenosis.  Ramus intermedius is large and widely patent  Right coronary is dominant and totally occluded in the mid to distal segment and receives collaterals from right to right and left to right.  Normal LV function.  LVEDP 8 mmHg.  EF 55%.  RECOMMENDATIONS:   We will discuss whether RCA is approachable by the CTO  team.  Would up titrate nitrate therapy to improve angina.  If refractory symptoms, consider repeat coronary bypass to the second and third diagonals as well as the  distal LAD.  Would also need to have LV branch of RCA and PDA grafted at the same time.  If only interventional approach is stenting of the mid LAD this would lead to a double layer of stent and will have limited long-term patency Diagnostic Dominance: Right    CTO PCI 08/15/20:  CORONARY STENT INTERVENTION  CORONARY CTO INTERVENTION  Intravascular Ultrasound/IVUS    Conclusion    3rd Diag-1 lesion is 90% stenosed.  3rd Diag-2 lesion is 70% stenosed.  Mid LAD lesion is 65% stenosed.  Mid LAD to Dist LAD lesion is 25% stenosed.  2nd Diag lesion is 45% stenosed.  Mid RCA to Dist RCA lesion is 100% stenosed.  A drug-eluting stent was successfully placed using a SYNERGY XD 3.50X38.  Ost RCA to Mid RCA lesion is 80% stenosed.  A drug-eluting stent was successfully placed using a SYNERGY XD 4.0X48.  RPDA lesion is 90% stenosed.  A drug-eluting stent was successfully placed using a SYNERGY XD 2.50X28.  A drug-eluting stent was successfully placed using a SYNERGY XD 4.0X12.  Post intervention, there is a 0% residual stenosis.  Post intervention, there is a 0% residual stenosis.  Post intervention, there is a 0% residual stenosis.   1. Successful CTO PCI of the RCA from the proximal vessel into the PDA with DES x 4 and IVUS guidance.  Plan: DAPT indefinitely given extensive nature of stents. We will see how she does clinically. If she has persistent anginal symptoms we could address the stenosis in the LAD at a later time.     Assessment and Plan:   1. CAD in native artery . Now s/p CTO PCI of the RCA with improvement in functional class from 3-4 to 2.  Status post CABG x 1 2006 with  LIMA-LAD. LIMA atretic on follow up one year later likely due to poor target and intramyocardial course. S/p DES of LAD  in 2007.  Prior cardiac cath in 2012 and 2016 showed patent LAD stent and normal appearing dominant RCA.    Catheterization 07/10/2020 with my interpretation as noted above.  Now s/p CTO PCI of the RCA. She has residual stenosis in the LAD that appears suitable for PCI. We discussed proceeding with PCI of the LAD at this point. It was noted at the time of her last cath that the right radial artery was tiny and not suitable for access. She does have a good left radial pulse which may offer access for PCI of the LAD. Would assess with [place] at the time of procedure.  Anticipate keeping on DAPT with ASA and Plavix indefinitely. Will arrange for PCI LAD on February 10. The procedure and risks were reviewed including but not limited to death, myocardial infarction, stroke, arrythmias, bleeding, transfusion, emergency surgery, dye allergy, or renal dysfunction. The patient voices understanding and is agreeable to proceed.   2. Essential hypertension, benign Continue Amlodipine 10 mg daily. Losartan 50 mg po bid.  3. Mixed hyperlipidemia Continue Atorvastatin 40 mg po daily.Labs from PCP 02/09/2020: TC 142, TG 125, HDL 48, LDL 72. Will update labs.  4. Type 2 diabetes mellitus with other circulatory complication, without long-term current use of insulin ([place]) Managed by PCP .    Medication Adjustments/Labs and Tests Ordered: Current medicines are reviewed at length with the patient today.  Concerns regarding medicines are outlined above.   Disposition: as noted above  Signed, Merrill Deanda [name] MD, [facility]   10/04/2020 12:09 PM

## 2020-10-04 ENCOUNTER — Encounter: Payer: Self-pay | Admitting: Cardiology

## 2020-10-04 ENCOUNTER — Other Ambulatory Visit: Payer: Self-pay | Admitting: Cardiology

## 2020-10-04 ENCOUNTER — Other Ambulatory Visit: Payer: Self-pay

## 2020-10-04 ENCOUNTER — Ambulatory Visit (INDEPENDENT_AMBULATORY_CARE_PROVIDER_SITE_OTHER): Payer: Medicare Other | Admitting: Cardiology

## 2020-10-04 VITALS — BP 95/60 | HR 61 | Ht 65.5 in | Wt 185.0 lb

## 2020-10-04 DIAGNOSIS — I10 Essential (primary) hypertension: Secondary | ICD-10-CM | POA: Diagnosis not present

## 2020-10-04 DIAGNOSIS — E782 Mixed hyperlipidemia: Secondary | ICD-10-CM

## 2020-10-04 DIAGNOSIS — E1159 Type 2 diabetes mellitus with other circulatory complications: Secondary | ICD-10-CM

## 2020-10-04 DIAGNOSIS — I209 Angina pectoris, unspecified: Secondary | ICD-10-CM

## 2020-10-04 DIAGNOSIS — I25118 Atherosclerotic heart disease of native coronary artery with other forms of angina pectoris: Secondary | ICD-10-CM | POA: Diagnosis not present

## 2020-10-04 MED ORDER — SODIUM CHLORIDE 0.9% FLUSH: 3.0000 mL | Freq: Two times a day (BID) | INTRAVENOUS | Status: DC

## 2020-10-04 NOTE — Patient Instructions (Signed)
Medication Instructions:  Continue same medications *If you need a refill on your cardiac medications before your next appointment, please call your pharmacy*   Lab Work: Bmet,cbc,pt,lipid panel   Monday 10/15/20  No food water only Lab opens 8:00 am to 12:00 noon Lab order enclosed  Covid Test [address] Thru Site Monday 10/15/20 at 12:55 pm  Testing/Procedures: Cardiac Cath   Follow instructions below   Follow-Up: At [facility], you and your health needs are our priority.  As part of our continuing mission to provide you with exceptional heart care, we have created designated Provider Care Teams.  These Care Teams include your primary Cardiologist (physician) and Advanced Practice Providers (APPs -  Physician Assistants and Nurse Practitioners) who all work together to provide you with the care you need, when you need it.  We recommend signing up for the patient portal called "MyChart".  Sign up information is provided on this After Visit Summary.  MyChart is used to connect with patients for Virtual Visits (Telemedicine).  Patients are able to view lab/test results, encounter notes, upcoming appointments, etc.  Non-urgent messages can be sent to your provider as well.   To learn more about what you can do with MyChart, go to [email or web address].    Your next appointment:     The format for your next appointment: Office   Provider:  [place] [address] Dept: [phone] Loc: [place]  10/04/2020  You are scheduled for a Cardiac Cath on Thursday 10/18/20, with Dr.Jordan.  1. Please arrive at the [facility] (Main Entrance A) at [facility]: [address] [place], [place] [address] at 8:30 am (This time is two hours before your procedure to ensure your preparation). Free valet parking service  is available.   Special note: Every effort is made to have your procedure done on time. Please understand that emergencies sometimes delay scheduled procedures.  2. Diet: Do not eat solid foods after midnight.  The patient may have clear liquids until 5am upon the day of the procedure.  3. Labs: You will need to have blood drawn on Monday 10/15/20 at [place] office. You do not need to be fasting.Have done 8:00 am to 12:00 noon  Covid Test:  Monday 10/15/20 at 12:55 pm at [address] Thru Site  4. Medication instructions in preparation for your procedure:     Hold Metformin morning of Cath and Hold 2 days after Cath    On the morning of your procedure, take your Aspirin and Plavix and any morning medicines NOT listed above.  You may use sips of water.  5. Plan for one night stay--bring personal belongings. 6. Bring a current list of your medications and current insurance cards. 7. You MUST have a responsible person to drive you home. 8. Someone MUST be with you the first 24 hours after you arrive home or your discharge will be delayed. 9. Please wear clothes that are easy to get on and off and wear slip-on shoes.  Thank you for allowing [place] to care for you!   -- [facility]

## 2020-10-04 NOTE — Addendum Note (Signed)
Addended by: Kathyrn Lass on: 10/04/2020 12:26 PM   Modules accepted: Orders

## 2020-10-15 ENCOUNTER — Other Ambulatory Visit (HOSPITAL_COMMUNITY)
Admission: RE | Admit: 2020-10-15 | Discharge: 2020-10-15 | Disposition: A | Discharge status: Home / Self Care | Payer: Medicare Other | Source: Ambulatory Visit | Attending: Cardiology | Admitting: Cardiology

## 2020-10-15 DIAGNOSIS — Z01812 Encounter for preprocedural laboratory examination: Secondary | ICD-10-CM | POA: Diagnosis present

## 2020-10-15 DIAGNOSIS — Z20822 Contact with and (suspected) exposure to covid-19: Secondary | ICD-10-CM | POA: Insufficient documentation

## 2020-10-15 LAB — BASIC METABOLIC PANEL
BUN/Creatinine Ratio: 26 (ref 12–28)
BUN: 18 mg/dL (ref 8–27)
CO2: 21 mmol/L (ref 20–29)
Calcium: 9.3 mg/dL (ref 8.7–10.3)
Chloride: 100 mmol/L (ref 96–106)
Creatinine, Ser: 0.7 mg/dL (ref 0.57–1.00)
GFR calc Af Amer: 97 mL/min/{1.73_m2} (ref >59)
GFR calc non Af Amer: 84 mL/min/{1.73_m2} (ref >59)
Glucose: 128 mg/dL — ABNORMAL HIGH (ref 65–99)
Potassium: 4.7 mmol/L (ref 3.5–5.2)
Sodium: 138 mmol/L (ref 134–144)

## 2020-10-15 LAB — CBC WITH DIFFERENTIAL/PLATELET
Basophils Absolute: 0 10*3/uL (ref 0.0–0.2)
Basos: 1 %
EOS (ABSOLUTE): 0.1 10*3/uL (ref 0.0–0.4)
Eos: 2 %
Hematocrit: 43.1 % (ref 34.0–46.6)
Hemoglobin: 14.7 g/dL (ref 11.1–15.9)
Immature Grans (Abs): 0 10*3/uL (ref 0.0–0.1)
Immature Granulocytes: 0 %
Lymphocytes Absolute: 1.2 10*3/uL (ref 0.7–3.1)
Lymphs: 26 %
MCH: 30.2 pg (ref 26.6–33.0)
MCHC: 34.1 g/dL (ref 31.5–35.7)
MCV: 89 fL (ref 79–97)
Monocytes Absolute: 0.4 10*3/uL (ref 0.1–0.9)
Monocytes: 9 %
Neutrophils Absolute: 3 10*3/uL (ref 1.4–7.0)
Neutrophils: 62 %
Platelets: 196 10*3/uL (ref 150–450)
RBC: 4.86 x10E6/uL (ref 3.77–5.28)
RDW: 12.4 % (ref 11.7–15.4)
WBC: 4.8 10*3/uL (ref 3.4–10.8)

## 2020-10-15 LAB — LIPID PANEL
Chol/HDL Ratio: 2.9 ratio (ref 0.0–4.4)
Cholesterol, Total: 150 mg/dL (ref 100–199)
HDL: 51 mg/dL (ref >39)
LDL Chol Calc (NIH): 79 mg/dL (ref 0–99)
Triglycerides: 113 mg/dL (ref 0–149)
VLDL Cholesterol Cal: 20 mg/dL (ref 5–40)

## 2020-10-15 LAB — PT AND PTT
INR: 1 (ref 0.9–1.2)
Prothrombin Time: 10.7 s (ref 9.1–12.0)
aPTT: 26 s (ref 24–33)

## 2020-10-15 LAB — SARS CORONAVIRUS 2 (TAT 6-24 HRS): SARS Coronavirus 2: NEGATIVE

## 2020-10-16 ENCOUNTER — Other Ambulatory Visit: Payer: Self-pay

## 2020-10-16 DIAGNOSIS — E782 Mixed hyperlipidemia: Secondary | ICD-10-CM

## 2020-10-16 DIAGNOSIS — I25118 Atherosclerotic heart disease of native coronary artery with other forms of angina pectoris: Secondary | ICD-10-CM

## 2020-10-16 MED ORDER — ATORVASTATIN CALCIUM 80 MG PO TABS: 80.0000 mg | ORAL_TABLET | Freq: Every day | ORAL | 3 refills | Status: DC

## 2020-10-17 NOTE — Progress Notes (Signed)
Called patient regarding procedure instructions for tomorrow.  Nothing to eat or drink after midnight, take all meds in the morning except diabetes meds and cozaar,  Hold those meds.  Needs responsible person to drive you home and stay with you for 24 hours.  Arrive time is 8:30

## 2020-10-18 ENCOUNTER — Other Ambulatory Visit: Payer: Self-pay

## 2020-10-18 ENCOUNTER — Ambulatory Visit (HOSPITAL_COMMUNITY)
Admission: RE | Admit: 2020-10-18 | Discharge: 2020-10-19 | Disposition: A | Discharge status: Home / Self Care | Payer: Medicare Other | Attending: Cardiology | Admitting: Cardiology

## 2020-10-18 ENCOUNTER — Encounter (HOSPITAL_COMMUNITY)
Admission: RE | Disposition: A | Discharge status: Home / Self Care | Payer: Self-pay | Source: Home / Self Care | Attending: Cardiology

## 2020-10-18 ENCOUNTER — Encounter (HOSPITAL_COMMUNITY): Payer: Self-pay | Admitting: Cardiology

## 2020-10-18 DIAGNOSIS — E1151 Type 2 diabetes mellitus with diabetic peripheral angiopathy without gangrene: Secondary | ICD-10-CM | POA: Insufficient documentation

## 2020-10-18 DIAGNOSIS — I2510 Atherosclerotic heart disease of native coronary artery without angina pectoris: Secondary | ICD-10-CM | POA: Diagnosis present

## 2020-10-18 DIAGNOSIS — I209 Angina pectoris, unspecified: Secondary | ICD-10-CM | POA: Diagnosis present

## 2020-10-18 DIAGNOSIS — Z79899 Other long term (current) drug therapy: Secondary | ICD-10-CM | POA: Diagnosis not present

## 2020-10-18 DIAGNOSIS — E782 Mixed hyperlipidemia: Secondary | ICD-10-CM | POA: Insufficient documentation

## 2020-10-18 DIAGNOSIS — I25119 Atherosclerotic heart disease of native coronary artery with unspecified angina pectoris: Secondary | ICD-10-CM

## 2020-10-18 DIAGNOSIS — Z7984 Long term (current) use of oral hypoglycemic drugs: Secondary | ICD-10-CM | POA: Diagnosis not present

## 2020-10-18 DIAGNOSIS — Z7982 Long term (current) use of aspirin: Secondary | ICD-10-CM | POA: Insufficient documentation

## 2020-10-18 DIAGNOSIS — I10 Essential (primary) hypertension: Secondary | ICD-10-CM | POA: Diagnosis not present

## 2020-10-18 DIAGNOSIS — Z951 Presence of aortocoronary bypass graft: Secondary | ICD-10-CM | POA: Insufficient documentation

## 2020-10-18 DIAGNOSIS — Z7902 Long term (current) use of antithrombotics/antiplatelets: Secondary | ICD-10-CM | POA: Diagnosis not present

## 2020-10-18 DIAGNOSIS — Z955 Presence of coronary angioplasty implant and graft: Secondary | ICD-10-CM | POA: Diagnosis not present

## 2020-10-18 HISTORY — PX: CORONARY ULTRASOUND/IVUS: CATH118244

## 2020-10-18 HISTORY — PX: CORONARY STENT INTERVENTION: CATH118234

## 2020-10-18 HISTORY — PX: INTRAVASCULAR ULTRASOUND/IVUS: CATH118244

## 2020-10-18 LAB — GLUCOSE, CAPILLARY
Glucose-Capillary: 134 mg/dL — ABNORMAL HIGH (ref 70–99)
Glucose-Capillary: 139 mg/dL — ABNORMAL HIGH (ref 70–99)
Glucose-Capillary: 159 mg/dL — ABNORMAL HIGH (ref 70–99)
Glucose-Capillary: 92 mg/dL (ref 70–99)

## 2020-10-18 SURGERY — CORONARY STENT INTERVENTION
Anesthesia: LOCAL

## 2020-10-18 MED ORDER — NEBIVOLOL HCL 10 MG PO TABS
10.0000 mg | ORAL_TABLET | Freq: Every day | ORAL | Status: DC
  Administered 2020-10-19: 10 mg via ORAL
  Filled 2020-10-18: qty 1

## 2020-10-18 MED ORDER — HEPARIN (PORCINE) IN NACL 1000-0.9 UT/500ML-% IV SOLN
INTRAVENOUS | Status: AC
  Filled 2020-10-18: qty 500

## 2020-10-18 MED ORDER — ASCORBIC ACID 500 MG PO TABS
1000.0000 mg | ORAL_TABLET | Freq: Every day | ORAL | Status: DC
  Administered 2020-10-19: 1000 mg via ORAL
  Filled 2020-10-18 (×2): qty 2

## 2020-10-18 MED ORDER — NOREPINEPHRINE BITARTRATE 1 MG/ML IV SOLN
INTRAVENOUS | Status: DC | PRN
  Administered 2020-10-18: 10 ug/min via INTRAVENOUS

## 2020-10-18 MED ORDER — VERAPAMIL HCL 2.5 MG/ML IV SOLN
INTRAVENOUS | Status: AC
  Filled 2020-10-18: qty 2

## 2020-10-18 MED ORDER — MIDAZOLAM HCL 2 MG/2ML IJ SOLN
INTRAMUSCULAR | Status: DC | PRN
  Administered 2020-10-18: 2 mg via INTRAVENOUS

## 2020-10-18 MED ORDER — HEPARIN SODIUM (PORCINE) 1000 UNIT/ML IJ SOLN
INTRAMUSCULAR | Status: DC | PRN
  Administered 2020-10-18: 9000 [IU] via INTRAVENOUS

## 2020-10-18 MED ORDER — CLOPIDOGREL BISULFATE 75 MG PO TABS
75.0000 mg | ORAL_TABLET | Freq: Every day | ORAL | Status: DC
  Administered 2020-10-19: 75 mg via ORAL
  Filled 2020-10-18: qty 1

## 2020-10-18 MED ORDER — SODIUM CHLORIDE 0.9% FLUSH: 3.0000 mL | INTRAVENOUS | Status: DC | PRN

## 2020-10-18 MED ORDER — ISOSORBIDE MONONITRATE ER 60 MG PO TB24
60.0000 mg | ORAL_TABLET | Freq: Every day | ORAL | Status: DC
Start: 2020-10-19 — End: 2020-10-19
  Administered 2020-10-19: 60 mg via ORAL
  Filled 2020-10-18: qty 1

## 2020-10-18 MED ORDER — NITROGLYCERIN 0.4 MG/SPRAY TL SOLN: 1.0000 | Status: DC | PRN

## 2020-10-18 MED ORDER — CLOPIDOGREL BISULFATE 75 MG PO TABS: 75.0000 mg | ORAL_TABLET | ORAL | Status: DC

## 2020-10-18 MED ORDER — EYE WASH OPHTH SOLN: 1.0000 [drp] | OPHTHALMIC | Status: DC

## 2020-10-18 MED ORDER — SODIUM CHLORIDE 0.9 % IV SOLN
INTRAVENOUS | Status: AC | PRN
  Administered 2020-10-18: 500 mL via INTRAVENOUS

## 2020-10-18 MED ORDER — NOREPINEPHRINE 4 MG/250ML-% IV SOLN
INTRAVENOUS | Status: AC
  Filled 2020-10-18: qty 250

## 2020-10-18 MED ORDER — LIDOCAINE HCL (PF) 1 % IJ SOLN
INTRAMUSCULAR | Status: DC | PRN
  Administered 2020-10-18: 2 mL via INTRADERMAL

## 2020-10-18 MED ORDER — ATORVASTATIN CALCIUM 80 MG PO TABS
80.0000 mg | ORAL_TABLET | Freq: Every day | ORAL | Status: DC
Start: 2020-10-19 — End: 2020-10-19
  Administered 2020-10-19: 80 mg via ORAL
  Filled 2020-10-18: qty 1

## 2020-10-18 MED ORDER — PANTOPRAZOLE SODIUM 40 MG PO TBEC
40.0000 mg | DELAYED_RELEASE_TABLET | Freq: Every day | ORAL | Status: DC
Start: 2020-10-19 — End: 2020-10-19
  Administered 2020-10-19: 40 mg via ORAL
  Filled 2020-10-18: qty 1

## 2020-10-18 MED ORDER — VITAMIN D 25 MCG (1000 UNIT) PO TABS
1000.0000 [IU] | ORAL_TABLET | Freq: Every day | ORAL | Status: DC
  Administered 2020-10-19: 1000 [IU] via ORAL
  Filled 2020-10-18: qty 1

## 2020-10-18 MED ORDER — SODIUM CHLORIDE 0.9 % IV SOLN: 250.0000 mL | INTRAVENOUS | Status: DC | PRN

## 2020-10-18 MED ORDER — NITROGLYCERIN 1 MG/10 ML FOR IR/CATH LAB
INTRA_ARTERIAL | Status: AC
  Filled 2020-10-18: qty 10

## 2020-10-18 MED ORDER — SODIUM CHLORIDE 0.9 % WEIGHT BASED INFUSION
3.0000 mL/kg/h | INTRAVENOUS | Status: DC
  Administered 2020-10-18: 3 mL/kg/h via INTRAVENOUS

## 2020-10-18 MED ORDER — LOSARTAN POTASSIUM 50 MG PO TABS
50.0000 mg | ORAL_TABLET | Freq: Two times a day (BID) | ORAL | Status: DC
  Administered 2020-10-18 – 2020-10-19 (×2): 50 mg via ORAL
  Filled 2020-10-18 (×2): qty 1

## 2020-10-18 MED ORDER — HEPARIN (PORCINE) IN NACL 1000-0.9 UT/500ML-% IV SOLN
INTRAVENOUS | Status: DC | PRN
  Administered 2020-10-18 (×3): 500 mL

## 2020-10-18 MED ORDER — DOCUSATE SODIUM 100 MG PO CAPS: 100.0000 mg | ORAL_CAPSULE | Freq: Every day | ORAL | Status: DC | PRN

## 2020-10-18 MED ORDER — ASPIRIN EC 81 MG PO TBEC
81.0000 mg | DELAYED_RELEASE_TABLET | Freq: Every day | ORAL | Status: DC
  Administered 2020-10-19: 81 mg via ORAL
  Filled 2020-10-18: qty 1

## 2020-10-18 MED ORDER — FERROUS GLUCONATE 324 (38 FE) MG PO TABS
324.0000 mg | ORAL_TABLET | Freq: Every day | ORAL | Status: DC
  Filled 2020-10-18: qty 1

## 2020-10-18 MED ORDER — MIDAZOLAM HCL 2 MG/2ML IJ SOLN
INTRAMUSCULAR | Status: AC
  Filled 2020-10-18: qty 2

## 2020-10-18 MED ORDER — IOHEXOL 350 MG/ML SOLN
INTRAVENOUS | Status: DC | PRN
  Administered 2020-10-18: 170 mL via INTRA_ARTERIAL

## 2020-10-18 MED ORDER — ASPIRIN 81 MG PO CHEW: 81.0000 mg | CHEWABLE_TABLET | ORAL | Status: DC

## 2020-10-18 MED ORDER — SODIUM CHLORIDE 0.9 % WEIGHT BASED INFUSION: 1.0000 mL/kg/h | INTRAVENOUS | Status: DC

## 2020-10-18 MED ORDER — VERAPAMIL HCL 2.5 MG/ML IV SOLN
INTRAVENOUS | Status: DC | PRN
  Administered 2020-10-18: 10 mL via INTRA_ARTERIAL

## 2020-10-18 MED ORDER — OMEGA-3-ACID ETHYL ESTERS 1 G PO CAPS
2.0000 g | ORAL_CAPSULE | Freq: Every day | ORAL | Status: DC
  Administered 2020-10-19: 2 g via ORAL
  Filled 2020-10-18: qty 2

## 2020-10-18 MED ORDER — LORATADINE 10 MG PO TABS
10.0000 mg | ORAL_TABLET | Freq: Every day | ORAL | Status: DC
  Administered 2020-10-19: 10 mg via ORAL
  Filled 2020-10-18: qty 1

## 2020-10-18 MED ORDER — IOHEXOL 350 MG/ML SOLN
INTRAVENOUS | Status: AC
  Filled 2020-10-18: qty 1

## 2020-10-18 MED ORDER — FENTANYL CITRATE (PF) 100 MCG/2ML IJ SOLN
INTRAMUSCULAR | Status: DC | PRN
  Administered 2020-10-18 (×2): 25 ug via INTRAVENOUS

## 2020-10-18 MED ORDER — AMLODIPINE BESYLATE 10 MG PO TABS
10.0000 mg | ORAL_TABLET | Freq: Every day | ORAL | Status: DC
  Administered 2020-10-19: 10 mg via ORAL
  Filled 2020-10-18: qty 1

## 2020-10-18 MED ORDER — ONDANSETRON HCL 4 MG/2ML IJ SOLN: 4.0000 mg | Freq: Four times a day (QID) | INTRAMUSCULAR | Status: DC | PRN

## 2020-10-18 MED ORDER — HEPARIN SODIUM (PORCINE) 1000 UNIT/ML IJ SOLN
INTRAMUSCULAR | Status: AC
  Filled 2020-10-18: qty 1

## 2020-10-18 MED ORDER — ACETAMINOPHEN 325 MG PO TABS: 650.0000 mg | ORAL_TABLET | ORAL | Status: DC | PRN

## 2020-10-18 MED ORDER — SODIUM CHLORIDE 0.9 % WEIGHT BASED INFUSION
1.0000 mL/kg/h | INTRAVENOUS | Status: AC
  Administered 2020-10-18: 1 mL/kg/h via INTRAVENOUS

## 2020-10-18 MED ORDER — FENTANYL CITRATE (PF) 100 MCG/2ML IJ SOLN
INTRAMUSCULAR | Status: AC
  Filled 2020-10-18: qty 2

## 2020-10-18 MED ORDER — SODIUM CHLORIDE 0.9% FLUSH
3.0000 mL | Freq: Two times a day (BID) | INTRAVENOUS | Status: DC
  Administered 2020-10-18 – 2020-10-19 (×2): 3 mL via INTRAVENOUS

## 2020-10-18 MED ORDER — ADULT MULTIVITAMIN W/MINERALS CH
1.0000 | ORAL_TABLET | Freq: Every evening | ORAL | Status: DC
  Administered 2020-10-18: 1 via ORAL
  Filled 2020-10-18: qty 1

## 2020-10-18 MED ORDER — LIDOCAINE HCL (PF) 1 % IJ SOLN
INTRAMUSCULAR | Status: AC
  Filled 2020-10-18: qty 30

## 2020-10-18 MED ORDER — HYDRALAZINE HCL 20 MG/ML IJ SOLN: 10.0000 mg | INTRAMUSCULAR | Status: AC | PRN

## 2020-10-18 MED ORDER — CALCIUM CARBONATE 1250 (500 CA) MG PO TABS
1.0000 | ORAL_TABLET | Freq: Every day | ORAL | Status: DC
  Administered 2020-10-19: 500 mg via ORAL
  Filled 2020-10-18: qty 1

## 2020-10-18 MED ORDER — NITROGLYCERIN 1 MG/10 ML FOR IR/CATH LAB
INTRA_ARTERIAL | Status: DC | PRN
  Administered 2020-10-18: 200 ug via INTRACORONARY

## 2020-10-18 SURGICAL SUPPLY — 18 items
BALLN SAPPHIRE 2.25X15 (BALLOONS) ×2
BALLN SAPPHIRE ~~LOC~~ 2.75X18 (BALLOONS) ×2 IMPLANT
BALLOON SAPPHIRE 2.25X15 (BALLOONS) ×1 IMPLANT
CATH DRAGONFLY OPSTAR (CATHETERS) ×2 IMPLANT
CATH INFINITI JR4 5F (CATHETERS) ×2 IMPLANT
CATH VISTA GUIDE 6FR XBLAD3.5 (CATHETERS) ×2 IMPLANT
GLIDESHEATH SLEND SS 6F .021 (SHEATH) ×2 IMPLANT
GUIDEWIRE INQWIRE 1.5J.035X260 (WIRE) ×1 IMPLANT
INQWIRE 1.5J .035X260CM (WIRE) ×2
KIT ENCORE 26 ADVANTAGE (KITS) ×2 IMPLANT
KIT HEART LEFT (KITS) ×2 IMPLANT
PACK CARDIAC CATHETERIZATION (CUSTOM PROCEDURE TRAY) ×2 IMPLANT
STENT RESOLUTE ONYX 2.5X34 (Permanent Stent) ×2 IMPLANT
TRANSDUCER W/STOPCOCK (MISCELLANEOUS) ×2 IMPLANT
TUBING CIL FLEX 10 FLL-RA (TUBING) ×2 IMPLANT
WIRE ASAHI PROWATER 180CM (WIRE) ×2 IMPLANT
WIRE COUGAR XT STRL 190CM (WIRE) ×2 IMPLANT
WIRE HI TORQ VERSACORE-J 145CM (WIRE) ×2 IMPLANT

## 2020-10-18 NOTE — Progress Notes (Signed)
Report and Care given to [facility] on 6E. Patient resting comfortably with no complaints. Lt Radial site remains Level 1. Patient being transported to [place] at this time.

## 2020-10-18 NOTE — Progress Notes (Signed)
    6 E called to check on Kimberly Flowers.s L wrist. There was a hematoma, distal to the TR band, that was 2" x 3" and ovall in shape, Manual pressure was held for 10 min.. until the tissue got softer. The hematoma was level one at this point. There is an area of soft tissue displacement around the TR band.

## 2020-10-18 NOTE — Discharge Summary (Addendum)
Discharge Summary    Patient ID: Kimberly Flowers MRN: [id number]; DOB: 01-15-1941  Admit date: 10/18/2020 Discharge date: 10/19/2020  PCP:  [name], MD   [place]  Cardiologist:  [name], MD  Advanced Practice Provider:  No care team member to display Electrophysiologist:  None   Discharge Diagnoses    Principal Problem:   Coronary artery disease involving native coronary artery of native heart with angina pectoris [facility]) Active Problems:   Mixed hyperlipidemia   Essential hypertension, benign   Coronary atherosclerosis of native coronary artery   Angina pectoris [facility])    Diagnostic Studies/Procedures    Cath: 10/18/20   Mid LAD lesion is 75% stenosed.  Mid LAD to Dist LAD lesion is 35% stenosed.  A drug-eluting stent was successfully placed using a STENT RESOLUTE ONYX 2.5X34.  Post intervention, there is a 0% residual stenosis.  Previously placed RPDA drug eluting stent is widely patent.  Previously placed Mid RCA to Dist RCA drug eluting stent is widely patent.  Previously placed Ost RCA to Mid RCA drug eluting stent is widely patent.   1. Widely patent RCA vessel from prior CTO PCI 2. Successful PCI of the proximal to mid LAD with OCT guidance and DES x 1 with 2.5 x 34 mm Resolute stent post dilated with 2.75 mm [place] balloon to 18 atm  Plan: will observe overnight tonight. Continue DAPT indefinitely. Anticipate DC in am.  Diagnostic Dominance: Right    Intervention     _____________   History of Present Illness     Kimberly Flowers is a 80 y.o. female with PMH of CAD s/p CTO of RCA, HTN, HLD, PAD, palpitations and DM2 who was seen in the office for follow up after recent CTO of RCA with DES x4 with Dr. [name]. Known to have residual disease in the LAD that was noted treated given the duration of CTO procedure. Attempted to treat the LAD medically but she continued to have ongoing episodes of angina. She was  scheduled for outpatient cath to address the LAD.   Hospital Course     Underwent cardiac cath noted above with PCI/DESx1 to the mLAD with [facility] guidance, along with widely patent RCA from prior CTO PCI. She will remain on DAPT indefinitely with ASA/plavix. No complications noted overnight. No chest pain. Seen by cardiac rehab and ambulated prior to discharge. Home medications were continued without significant change. Would consider adding SGLT2 at follow up visit given CAD and DM.   General: Well developed, well nourished, female appearing in no acute distress. Head: Normocephalic, atraumatic.  Neck: Supple without bruits, JVD. Lungs:  Resp regular and unlabored, CTA. Heart: RRR, S1, S2, no S3, S4, or murmur; no rub. Abdomen: Soft, non-tender, non-distended with normoactive bowel sounds. No hepatomegaly. No rebound/guarding. No obvious abdominal masses. Extremities: No clubbing, cyanosis, edema. Distal pedal pulses are 2+ bilaterally. Right radial cath site stable with bruising but no hematoma Neuro: Alert and oriented X 3. Moves all extremities spontaneously. Psych: Normal affect.   Did the patient have an acute coronary syndrome (MI, NSTEMI, STEMI, etc) this admission?:  No                               Did the patient have a percutaneous coronary intervention (stent / angioplasty)?:  Yes.     Cath/PCI Registry Performance & Quality Measures: 1. Aspirin prescribed? - Yes 2. ADP Receptor  Inhibitor (Plavix/Clopidogrel, Brilinta/Ticagrelor or Effient/Prasugrel) prescribed (includes medically managed patients)? - Yes 3. High Intensity Statin (Lipitor 40-54m or Crestor 20-474m prescribed? - Yes 4. For EF <40%, was ACEI/ARB prescribed? - Yes 5. For EF <40%, Aldosterone Antagonist (Spironolactone or Eplerenone) prescribed? - Not Applicable (EF >/= 4033%6. Cardiac Rehab Phase II ordered? - Yes       _____________  Discharge Vitals Blood pressure (!) 148/63, pulse (!) 53, temperature  98.3 F (36.8 C), temperature source Oral, resp. rate 20, height 5' 5.5" (1.664 m), weight 85.3 kg, SpO2 93 %.  Filed Weights   10/18/20 0814  Weight: 85.3 kg    Labs & Radiologic Studies    CBC Recent Labs    10/19/20 0113  WBC 5.6  HGB 15.1*  HCT 45.8  MCV 91.4  PLT 19545 Basic Metabolic Panel Recent Labs    10/19/20 0113  NA 137  K 3.9  CL 105  CO2 22  GLUCOSE 168*  BUN 16  CREATININE 0.69  CALCIUM 9.0   Liver Function Tests No results for input(s): AST, ALT, ALKPHOS, BILITOT, PROT, ALBUMIN in the last 72 hours. No results for input(s): LIPASE, AMYLASE in the last 72 hours. High Sensitivity Troponin:   No results for input(s): TROPONINIHS in the last 720 hours.  BNP Invalid input(s): POCBNP D-Dimer No results for input(s): DDIMER in the last 72 hours. Hemoglobin A1C No results for input(s): HGBA1C in the last 72 hours. Fasting Lipid Panel No results for input(s): CHOL, HDL, LDLCALC, TRIG, CHOLHDL, LDLDIRECT in the last 72 hours. Thyroid Function Tests No results for input(s): TSH, T4TOTAL, T3FREE, THYROIDAB in the last 72 hours.  Invalid input(s): FREET3 _____________  CARDIAC CATHETERIZATION  Result Date: 10/18/2020  Mid LAD lesion is 75% stenosed.  Mid LAD to Dist LAD lesion is 35% stenosed.  A drug-eluting stent was successfully placed using a STENT RESOLUTE ONYX 2.5X34.  Post intervention, there is a 0% residual stenosis.  Previously placed RPDA drug eluting stent is widely patent.  Previously placed Mid RCA to Dist RCA drug eluting stent is widely patent.  Previously placed Ost RCA to Mid RCA drug eluting stent is widely patent.  1. Widely patent RCA vessel from prior CTO PCI 2. Successful PCI of the proximal to mid LAD with OCT guidance and DES x 1 with 2.5 x 34 mm Resolute stent post dilated with 2.75 mm  balloon to 18 atm Plan: will observe overnight tonight. Continue DAPT indefinitely. Anticipate DC in am.   Disposition   Pt is being  discharged home today in good condition.  Follow-up Plans & Appointments     Follow-up Information    QuVerta Ellen NP Follow up on 10/29/2020.   Specialty: Cardiology [place]  Contact information: [address] [phone]            Discharge Instructions    Amb Referral to Cardiac Rehabilitation   Complete by: As directed    [facility]   Diagnosis: Coronary Stents   After initial evaluation and assessments completed: Virtual Based Care may be provided alone or in conjunction with Phase 2 Cardiac Rehab based on patient barriers.: Yes   Diet - low sodium heart healthy   Complete by: As directed    Discharge instructions   Complete by: As directed    Radial Site Care Refer to this sheet in the next  few weeks. These instructions provide you with information on caring for yourself after your procedure. Your caregiver may also give you more specific instructions. Your treatment has been planned according to current medical practices, but problems sometimes occur. Call your caregiver if you have any problems or questions after your procedure. HOME CARE INSTRUCTIONS You may shower the day after the procedure.Remove the bandage (dressing) and gently wash the site with plain soap and water.Gently pat the site dry.  Do not apply powder or lotion to the site.  Do not submerge the affected site in water for 3 to 5 days.  Inspect the site at least twice daily.  Do not flex or bend the affected arm for 24 hours.  No lifting over 5 pounds (2.3 kg) for 5 days after your procedure.  Do not drive home if you are discharged the same day of the procedure. Have someone else drive you.  You may drive 24 hours after the procedure unless otherwise instructed by your caregiver.  What to expect: Any bruising will usually fade within 1 to 2 weeks.  Blood that collects in the tissue (hematoma) may be painful  to the touch. It should usually decrease in size and tenderness within 1 to 2 weeks.  SEEK IMMEDIATE MEDICAL CARE IF: You have unusual pain at the radial site.  You have redness, warmth, swelling, or pain at the radial site.  You have drainage (other than a small amount of blood on the dressing).  You have chills.  You have a fever or persistent symptoms for more than 72 hours.  You have a fever and your symptoms suddenly get worse.  Your arm becomes pale, cool, tingly, or numb.  You have heavy bleeding from the site. Hold pressure on the site.   PLEASE DO NOT MISS ANY DOSES OF YOUR PLAVIX!!!!! Also keep a log of you blood pressures and bring back to your follow up appt. Please call the office with any questions.   Patients taking blood thinners should generally stay away from medicines like ibuprofen, Advil, Motrin, naproxen, and Aleve due to risk of stomach bleeding. You may take Tylenol as directed or talk to your primary doctor about alternatives.   PLEASE ENSURE THAT YOU DO NOT RUN OUT OF YOUR PLAVIX. This medication is very important to remain on for at least one year. IF you have issues obtaining this medication due to cost please CALL the office 3-5 business days prior to running out in order to prevent missing doses of this medication.   Increase activity slowly   Complete by: As directed       Discharge Medications   Allergies as of 10/19/2020      Reactions   Ranolazine Er Other (See Comments)   Dizziness    Sulfonamide Derivatives    Unknown      Medication List    TAKE these medications   acetaminophen 500 MG tablet Commonly known as: TYLENOL Take 500-1,000 mg by mouth every 6 (six) hours as needed for moderate pain.   amLODipine 10 MG tablet Commonly known as: NORVASC Take 1 tablet (10 mg total) by mouth daily.   aspirin 81 MG EC tablet Take 81 mg by mouth daily.   atorvastatin 80 MG tablet Commonly known as: LIPITOR Take 1 tablet (80 mg total) by mouth  daily.   Calcium Plus Vitamin D3 600-500 MG-UNIT Caps Generic drug: Calcium Carb-Cholecalciferol Take 1 tablet by mouth daily.   cholecalciferol 25 MCG (1000 UNIT) tablet Commonly  known as: VITAMIN D3 Take 1,000 Units by mouth daily. In the morning   clopidogrel 75 MG tablet Commonly known as: PLAVIX Take 1 tablet (75 mg total) by mouth daily.   docusate sodium 100 MG capsule Commonly known as: COLACE Take 100 mg by mouth daily as needed for mild constipation or moderate constipation.   eye wash Soln Place 1 drop into both eyes 3 (three) times a week.   ferrous gluconate 324 MG tablet Commonly known as: FERGON Take 324 mg by mouth at bedtime.   fexofenadine 180 MG tablet Commonly known as: ALLEGRA Take 180 mg by mouth daily.   FISH OIL PO Take 2,000 mg by mouth daily.   Glucosamine-Chondroitin-MSM-D3 Tabs Take 1 tablet by mouth daily.   isosorbide mononitrate 60 MG 24 hr tablet Commonly known as: IMDUR Take 60 mg by mouth daily. What changed: Another medication with the same name was removed. Continue taking this medication, and follow the directions you see here.   losartan 50 MG tablet Commonly known as: COZAAR Take 50 mg by mouth 2 (two) times daily.   metFORMIN 500 MG tablet Commonly known as: GLUCOPHAGE Take 500 mg by mouth every evening.   multivitamin with minerals Tabs tablet Take 1 tablet by mouth every evening.   nebivolol 10 MG tablet Commonly known as: BYSTOLIC Take 1 tablet (10 mg total) by mouth daily.   nitroGLYCERIN 0.4 MG/SPRAY spray Commonly known as: NITROLINGUAL Place 1 spray under the tongue every 5 (five) minutes x 3 doses as needed for chest pain (if no relief after 3rd dose, proceed to the ED for an evaluation).   pantoprazole 40 MG tablet Commonly known as: PROTONIX Take 1 tablet (40 mg total) by mouth daily.   vitamin C with rose hips 500 MG tablet Take 1,000 mg by mouth daily.          Outstanding Labs/Studies   N/a    Duration of Discharge Encounter   Greater than 30 minutes including physician time.  Signed, Reino Bellis, NP 10/19/2020, 10:19 AM    I have examined the patient and reviewed assessment and plan and discussed with patient.  Agree with above as stated.    GEN: Well nourished, well developed, in no acute distress  HEENT: normal  Neck: no JVD, carotid bruits, or masses Cardiac: RRR; no murmurs, rubs, or gallops,no edema  Respiratory:  clear to auscultation bilaterally, normal work of breathing GI: soft, nontender, nondistended,  MS: no deformity or atrophy ; left wrist bruising, 2+ pulse, no hematoma Skin: warm and dry, no rash, bruising at left wrist Neuro:  Strength and sensation are intact Psych: euthymic mood, full affect  Continue dual antiplatelet therapy along with aggressive secondary prevention.  Continue risk factor modification including diabetes control, blood pressure control and lipid-lowering therapy.  May be able to wean off of isosorbide going forward.  I reviewed her angiograms as well.  She did appear to have transient slow flow and hypotension was reported transiently as well.  She feels well today.  No symptoms of ischemia.  She did well with cardiac rehab.  Larae Grooms

## 2020-10-18 NOTE — Interval H&P Note (Signed)
History and Physical Interval Note:  10/18/2020 8:36 AM  Atkins  has presented today for surgery, with the diagnosis of CAD.  The various methods of treatment have been discussed with the patient and family. After consideration of risks, benefits and other options for treatment, the patient has consented to  Procedure(s): CORONARY STENT INTERVENTION (N/A) as a surgical intervention.  The patient's history has been reviewed, patient examined, no change in status, stable for surgery.  I have reviewed the patient's chart and labs.  Questions were answered to the patient's satisfaction.   Cath Lab Visit (complete for each Cath Lab visit)  Clinical Evaluation Leading to the Procedure:   ACS: No.  Non-ACS:    Anginal Classification: CCS III  Anti-ischemic medical therapy: Maximal Therapy (2 or more classes of medications)  Non-Invasive Test Results: No non-invasive testing performed  Prior CABG: Previous CABG        Collier [facility]ina [facility] 10/18/2020 8:36 AM

## 2020-10-18 NOTE — Progress Notes (Signed)
PIV, 20g, noted to left hand upon arrival to holding, [place], DC'D, catheter intact upon removal, gauze drsg with tape in place, CDI

## 2020-10-19 DIAGNOSIS — E782 Mixed hyperlipidemia: Secondary | ICD-10-CM | POA: Diagnosis not present

## 2020-10-19 DIAGNOSIS — I10 Essential (primary) hypertension: Secondary | ICD-10-CM

## 2020-10-19 DIAGNOSIS — I25119 Atherosclerotic heart disease of native coronary artery with unspecified angina pectoris: Secondary | ICD-10-CM | POA: Diagnosis not present

## 2020-10-19 LAB — BASIC METABOLIC PANEL
Anion gap: 10 (ref 5–15)
BUN: 16 mg/dL (ref 8–23)
CO2: 22 mmol/L (ref 22–32)
Calcium: 9 mg/dL (ref 8.9–10.3)
Chloride: 105 mmol/L (ref 98–111)
Creatinine, Ser: 0.69 mg/dL (ref 0.44–1.00)
GFR, Estimated: >60 mL/min (ref >60)
Glucose, Bld: 168 mg/dL — ABNORMAL HIGH (ref 70–99)
Potassium: 3.9 mmol/L (ref 3.5–5.1)
Sodium: 137 mmol/L (ref 135–145)

## 2020-10-19 LAB — CBC
HCT: 45.8 % (ref 36.0–46.0)
Hemoglobin: 15.1 g/dL — ABNORMAL HIGH (ref 12.0–15.0)
MCH: 30.1 pg (ref 26.0–34.0)
MCHC: 33 g/dL (ref 30.0–36.0)
MCV: 91.4 fL (ref 80.0–100.0)
Platelets: 190 10*3/uL (ref 150–400)
RBC: 5.01 MIL/uL (ref 3.87–5.11)
RDW: 12.8 % (ref 11.5–15.5)
WBC: 5.6 10*3/uL (ref 4.0–10.5)
nRBC: 0 % (ref 0.0–0.2)

## 2020-10-19 LAB — POCT ACTIVATED CLOTTING TIME
Activated Clotting Time: 249 seconds
Activated Clotting Time: 553 seconds

## 2020-10-19 LAB — GLUCOSE, CAPILLARY: Glucose-Capillary: 189 mg/dL — ABNORMAL HIGH (ref 70–99)

## 2020-10-19 NOTE — Progress Notes (Signed)
CARDIAC REHAB PHASE I   PRE:  Rate/Rhythm: 2 SR  BP:  Supine:   Sitting: 151/59  Standing:    SaO2: 93%RA  MODE:  Ambulation: 420 ft   POST:  Rate/Rhythm: 77 SR  BP:  Supine:   Sitting: 146/74  Standing:    SaO2: 97%RA 0816-0900 Pt walked 420 ft on RA with steady gait and tolerated well. No CP. Tired by end of walk. Reviewed NTG use, heart healthy diet and diabetic diets given, walking instructions for ex, and importance of plavix. Will send update referral letter to [facility] CRP 2. Pt has already been contacted by them. Pt voiced understanding of ed.    Graylon Good, RN BSN  10/19/2020 8:55 AM

## 2020-10-22 ENCOUNTER — Telehealth (HOSPITAL_COMMUNITY): Payer: Self-pay

## 2020-10-22 NOTE — Telephone Encounter (Signed)
Per phase I, fax cardiac rehab referral to [facility] cardiac rehab.

## 2020-10-28 NOTE — Progress Notes (Signed)
Cardiology Office Note  Date: 10/29/2020   ID: Kimberly, Flowers 09-Jan-1941, MRN [id number]  PCP:  [name], MD  Cardiologist:  [name], MD Electrophysiologist:  None   Chief Complaint: Status post PCI  History of Present Illness: Kimberly Flowers is a 80 y.o. female with a history of CAD, HTN, HLD, PAD palpitations, DM2, anemia.  Recent admit to 06/27/2021 for PCI / DES x 1 to mLAD with [facility] guidance with widely patient previous CTO of RCA and DES x4 She was known to have residual disease in LAD but not treated at that time. Due to ongoing anginal episodes on medical therapy and outpatient cath was scheduled to address the LAD as noted above. She was to continue DAPT therapy with ASA/Plavix indefinitely. No chest pain after the procedure. Home medications were continued. Suggested adding SGlT2 at follow up due to CAD/DM  Is here today for post hospital follow-up stating she is feeling better.  States she has an occasional twinge of chest pain which is short-lived.  Also complaining of some dizziness when she gets up from a sitting position.  She recently had her Bystolic increased to 10 mg daily.  Otherwise she denies any anginal or exertional symptoms, palpitations or arrhythmias, orthostatic symptoms other than above described..  She has some bruising in her left arm from catheter access.  She denies any PND, orthopnea, bleeding issues, lower extremity edema.  States she is due to see Dr. [name] on March 11.   Past Medical History:  Diagnosis Date  . Anemia   . Coronary atherosclerosis of native coronary artery    Previous PCI 2007-2008, Dr. Sharyon Cable  . Essential hypertension, benign   . Mixed hyperlipidemia   . PAD (peripheral artery disease) (HCC)     Absent right DP  . Palpitations   . Type 2 diabetes mellitus ([place])   . Vasomotor rhinitis     Past Surgical History:  Procedure Laterality Date  . ABDOMINAL HYSTERECTOMY    . CARDIAC CATHETERIZATION N/A  08/17/2015   Procedure: Left Heart Cath and Coronary Angiography;  Surgeon: Burnell Blanks, MD;  Location: [facility];  Service: Cardiovascular;  Laterality: N/A;  . CORONARY ARTERY BYPASS GRAFT  2006   [name], single vessel  . CORONARY CTO INTERVENTION N/A 08/15/2020   Procedure: CORONARY CTO INTERVENTION;  Surgeon: [name], [name], MD;  Location: [facility];  Service: Cardiovascular;  Laterality: N/A;  . CORONARY STENT INTERVENTION N/A 08/15/2020   Procedure: CORONARY STENT INTERVENTION;  Surgeon: [name], [name], MD;  Location: [facility];  Service: Cardiovascular;  Laterality: N/A;  . CORONARY STENT INTERVENTION N/A 10/18/2020   Procedure: CORONARY STENT INTERVENTION;  Surgeon: [name], [name], MD;  Location: [facility];  Service: Cardiovascular;  Laterality: N/A;  . CORONARY STENT INTERVENTION  10/18/2020  . INTRAVASCULAR ULTRASOUND/IVUS N/A 08/15/2020   Procedure: Intravascular Ultrasound/IVUS;  Surgeon: [name], [name], MD;  Location: [facility];  Service: Cardiovascular;  Laterality: N/A;  . INTRAVASCULAR ULTRASOUND/IVUS N/A 10/18/2020   Procedure: Intravascular Ultrasound/IVUS;  Surgeon: [name], [name], MD;  Location: [facility];  Service: Cardiovascular;  Laterality: N/A;  . LEFT HEART CATH AND CORONARY ANGIOGRAPHY N/A 07/10/2020   Procedure: LEFT HEART CATH AND CORONARY ANGIOGRAPHY;  Surgeon: [name], MD;  Location: [facility];  Service: Cardiovascular;  Laterality: N/A;    Current Outpatient Medications  Medication Sig Dispense Refill  . acetaminophen (  TYLENOL) 500 MG tablet Take 500-1,000 mg by mouth every 6 (six) hours as needed for moderate pain.    Marland Kitchen amLODipine (NORVASC) 10 MG tablet Take 1 tablet (10 mg total) by mouth daily. 90 tablet 1  . Ascorbic Acid (VITAMIN C WITH ROSE HIPS) 500 MG tablet Take 1,000 mg by mouth daily.    Marland Kitchen aspirin 81 MG EC tablet Take 81 mg by mouth daily.    Marland Kitchen atorvastatin (LIPITOR) 80  MG tablet Take 1 tablet (80 mg total) by mouth daily. 90 tablet 3  . Calcium Carb-Cholecalciferol (CALCIUM PLUS VITAMIN D3) 600-500 MG-UNIT CAPS Take 1 tablet by mouth daily.    . cholecalciferol (VITAMIN D3) 25 MCG (1000 UNIT) tablet Take 1,000 Units by mouth daily. In the morning    . clopidogrel (PLAVIX) 75 MG tablet Take 1 tablet (75 mg total) by mouth daily. 90 tablet 3  . docusate sodium (COLACE) 100 MG capsule Take 100 mg by mouth daily as needed for mild constipation or moderate constipation.     Marland Kitchen eye wash (,SODIUM/POTASSIUM/SOD CHLORIDE,) SOLN Place 1 drop into both eyes 3 (three) times a week.    . ferrous gluconate (FERGON) 324 MG tablet Take 324 mg by mouth at bedtime.     . fexofenadine (ALLEGRA) 180 MG tablet Take 180 mg by mouth daily.    . Glucosamine-Chondroitin-MSM-D3 TABS Take 1 tablet by mouth daily.    . isosorbide mononitrate (IMDUR) 60 MG 24 hr tablet Take 60 mg by mouth daily.    Marland Kitchen losartan (COZAAR) 50 MG tablet Take 50 mg by mouth 2 (two) times daily.    . metFORMIN (GLUCOPHAGE) 500 MG tablet Take 500 mg by mouth every evening.     . Multiple Vitamin (MULTIVITAMIN WITH MINERALS) TABS tablet Take 1 tablet by mouth every evening.    . nebivolol (BYSTOLIC) 10 MG tablet Take 1 tablet (10 mg total) by mouth daily. 30 tablet 6  . nitroGLYCERIN (NITROLINGUAL) 0.4 MG/SPRAY spray Place 1 spray under the tongue every 5 (five) minutes x 3 doses as needed for chest pain (if no relief after 3rd dose, proceed to the ED for an evaluation). 4.9 g 0  . Omega-3 Fatty Acids (FISH OIL PO) Take 2,000 mg by mouth daily.    . pantoprazole (PROTONIX) 40 MG tablet Take 1 tablet (40 mg total) by mouth daily. 30 tablet 11   No current facility-administered medications for this visit.   Allergies:  Ranolazine er and Sulfonamide derivatives   Social History: The patient  reports that she has never smoked. She has never used smokeless tobacco. She reports that she does not drink alcohol and does  not use drugs.   Family History: The patient's family history includes CAD in an other family member; Cancer in her sister.   ROS:  Please see the history of present illness. Otherwise, complete review of systems is positive for none.  All other systems are reviewed and negative.   Physical Exam: VS:  BP 118/74   Pulse 63   Ht 5' 5.5" (1.664 m)   Wt 190 lb (86.2 kg)   SpO2 95%   BMI 31.14 kg/m , BMI Body mass index is 31.14 kg/m.  Wt Readings from Last 3 Encounters:  10/29/20 190 lb (86.2 kg)  10/18/20 188 lb (85.3 kg)  10/04/20 185 lb (83.9 kg)    General: Patient appears comfortable at rest. Neck: Supple, no elevated JVP or carotid bruits, no thyromegaly. Lungs: Clear to auscultation, nonlabored breathing  at rest. Cardiac: Regular rate and rhythm, no S3 or significant systolic murmur, no pericardial rub. Extremities: No pitting edema, distal pulses 2+. Skin: Warm and dry. Musculoskeletal: No kyphosis. Neuropsychiatric: Alert and oriented x3, affect grossly appropriate.  ECG:  EKG October 18, 2020 sinus bradycardia with rate of 56, LAD, inferior infarct age undetermined.  T wave abnormality in anteroseptal leads.  Recent Labwork: 10/19/2020: BUN 16; Creatinine, Ser 0.69; Hemoglobin 15.1; Platelets 190; Potassium 3.9; Sodium 137     Component Value Date/Time   CHOL 150 10/15/2020 0948   TRIG 113 10/15/2020 0948   HDL 51 10/15/2020 0948   CHOLHDL 2.9 10/15/2020 0948   CHOLHDL 3.4 08/17/2015 0412   VLDL 22 08/17/2015 0412   LDLCALC 79 10/15/2020 0948    Other Studies Reviewed Today:   Cath: 10/18/20   Mid LAD lesion is 75% stenosed.  Mid LAD to Dist LAD lesion is 35% stenosed.  A drug-eluting stent was successfully placed using a STENT RESOLUTE ONYX 2.5X34.  Post intervention, there is a 0% residual stenosis.  Previously placed RPDA drug eluting stent is widely patent.  Previously placed Mid RCA to Dist RCA drug eluting stent is widely patent.  Previously  placed Ost RCA to Mid RCA drug eluting stent is widely patent.  1. Widely patent RCA vessel from prior CTO PCI 2. Successful PCI of the proximal to mid LAD with OCT guidance and DES x 1 with 2.5 x 34 mm Resolute stent post dilated with 2.75 mm Galva balloon to 18 atm  Plan: will observe overnight tonight. Continue DAPT indefinitely. Anticipate DC in am.  Diagnostic Dominance: Right    Intervention      08/15/2020 CORONARY STENT INTERVENTION  CORONARY CTO INTERVENTION  Intravascular Ultrasound/IVUS    Conclusion    3rd Diag-1 lesion is 90% stenosed.  3rd Diag-2 lesion is 70% stenosed.  Mid LAD lesion is 65% stenosed.  Mid LAD to Dist LAD lesion is 25% stenosed.  2nd Diag lesion is 45% stenosed.  Mid RCA to Dist RCA lesion is 100% stenosed.  A drug-eluting stent was successfully placed using a SYNERGY XD 3.50X38.  Ost RCA to Mid RCA lesion is 80% stenosed.  A drug-eluting stent was successfully placed using a SYNERGY XD 4.0X48.  RPDA lesion is 90% stenosed.  A drug-eluting stent was successfully placed using a SYNERGY XD 2.50X28.  A drug-eluting stent was successfully placed using a SYNERGY XD 4.0X12.  Post intervention, there is a 0% residual stenosis.  Post intervention, there is a 0% residual stenosis.  Post intervention, there is a 0% residual stenosis.   1. Successful CTO PCI of the RCA from the proximal vessel into the PDA with DES x 4 and IVUS guidance.  Plan: DAPT indefinitely given extensive nature of stents. We will see how she does clinically. If she has persistent anginal symptoms we could address the stenosis in the LAD at a later time.  Diagnostic Dominance: Right    Intervention      07/10/2020 LEFT HEART CATH AND CORONARY ANGIOGRAPHY    Severe two-vessel coronary artery disease involving the dominant right coronary and the previously stented mid to distal LAD which involves to moderate to large diagonals.  Left main is  widely patent  LAD contains segmental 60 to 70% mid stenosis proximal to a previously placed stent in the distal segment.  The stent jails a large and moderate sized second and third diagonals.  The third diagonal has proximal and mid significant stenoses.  LAD  supplies collaterals to the right coronary via septal perforators and around the apex.  Circumflex gives origin to 2 obtuse marginal branches which are free of any significant obstruction.  The proximal circumflex contains eccentric 20% stenosis.  Ramus intermedius is large and widely patent  Right coronary is dominant and totally occluded in the mid to distal segment and receives collaterals from right to right and left to right.  Normal LV function.  LVEDP 8 mmHg.  EF 55%.  RECOMMENDATIONS:   We will discuss whether RCA is approachable by the CTO team.  Would up titrate nitrate therapy to improve angina.  If refractory symptoms, consider repeat coronary bypass to the second and third diagonals as well as the distal LAD.  Would also need to have LV branch of RCA and PDA grafted at the same time.  If only interventional approach is stenting of the mid LAD this would lead to a double layer of stent and will have limited long-term patency.  Diagnostic Dominance: Right       Assessment and Plan:  1. CAD in native artery   2. Essential hypertension, benign   3. Mixed hyperlipidemia   4. Type 2 diabetes mellitus with other circulatory complication, without long-term current use of insulin ([place])     1. CAD in native artery Denies any active anginal or exertional symptoms.  Continue aspirin 81 mg daily.  Continue Plavix 75 mg daily.  Continue Imdur 60 mg daily.  2. Essential hypertension, benign Blood pressure well controlled today at 118/74.  Continue amlodipine 10 mg daily.  Continue losartan 50 mg p.o. twice daily.  Continue Bystolic 10 mg daily.  3. Mixed hyperlipidemia Continue atorvastatin 80 mg daily.  Lipid  panel on 10/15/2020 total cholesterol 150, triglycerides 113, HDL 51, LDL 79.  4. Type 2 diabetes mellitus with other circulatory complication, without long-term current use of insulin (HCC) Currently taking Metformin 500 mg every evening.  There was mention of possibly starting Jardiance.  I suggested she restart the medication.  Patient states she is going to talk to Dr. [name] in March regarding starting Jardiance since he is the one who mentioned it to her.    Medication Adjustments/Labs and Tests Ordered: Current medicines are reviewed at length with the patient today.  Concerns regarding medicines are outlined above.   Disposition: Follow-up with Dr. Harl Bowie or APP late July  Signed, Jaire Pinkham Quinn, NP 10/29/2020 9:26 AM    [facility] [address], [place], [place] [address] Phone: [phone]; Fax: [phone]

## 2020-10-29 ENCOUNTER — Ambulatory Visit (INDEPENDENT_AMBULATORY_CARE_PROVIDER_SITE_OTHER): Payer: Medicare Other | Admitting: Family Medicine

## 2020-10-29 ENCOUNTER — Encounter: Payer: Self-pay | Admitting: Family Medicine

## 2020-10-29 VITALS — BP 118/74 | HR 63 | Ht 65.5 in | Wt 190.0 lb

## 2020-10-29 DIAGNOSIS — E1159 Type 2 diabetes mellitus with other circulatory complications: Secondary | ICD-10-CM | POA: Diagnosis not present

## 2020-10-29 DIAGNOSIS — I2510 Atherosclerotic heart disease of native coronary artery without angina pectoris: Secondary | ICD-10-CM

## 2020-10-29 DIAGNOSIS — E782 Mixed hyperlipidemia: Secondary | ICD-10-CM

## 2020-10-29 DIAGNOSIS — I10 Essential (primary) hypertension: Secondary | ICD-10-CM

## 2020-10-29 NOTE — Patient Instructions (Signed)
Medication Instructions:  Continue all current medications.  Labwork: none  Testing/Procedures: none  Follow-Up: Late July   Any Other Special Instructions Will Be Listed Below (If Applicable).  If you need a refill on your cardiac medications before your next appointment, please call your pharmacy.

## 2020-11-09 ENCOUNTER — Encounter: Payer: Self-pay | Admitting: Cardiology

## 2020-11-12 NOTE — Progress Notes (Signed)
Cardiology Office Note  Date: 11/16/2020   ID: Kimberly, Flowers 1940/10/15, MRN [id number]  PCP:  [name], MD  Cardiologist:  [name], MD Electrophysiologist:  None   Chief Complaint: Follow-up CAD, cardiac catheterization  History of Present Illness: Kimberly Flowers is a 80 y.o. female seen for follow up s/p CTO PCI of the RCA. She has  a history of CAD, HLD, PAD, palpitations, DM 2.   History of prior CABG in 2006 at [place]. I have carefully reviewed records available from [facility] and [facility]. She was diagnosed with CAD in 2006. Was felt to not be a good candidate for PCI due to bifurcation lesion in the LAD.  She was referred to CT surgery and underwent single vessel CABG with LIMA to the LAD. Op note indicates that the LAD had an intramyocardial course. It only emerged in the intraventricular groove 3 cm from the apex and was too small to graft at this point. The LIMA was tied into a "medial branch". She had recurrent symptoms and  Cardiac cath demonstrated  subsequent occlusion of LIMA. She did undergo stenting of the LAD with DES and POBA of the diagonal through the stent strut. There is mention of a second stent procedure in ?2008 but I find no documentation of this and review of subsequent angiograms show what appears to be a single stent in the mid LAD. She had repeat cardiac caths in 2012 at [facility] and in 2016 here showing a normal RCA and patent LAD stent.   Low risk nuclear stress 09/16/2019.  Over the past several months she has developed progressive anginal symptoms. This is described as mid substernal pain radiating into jaws and shoulders bilaterally. Associated with SOB. Relieved with sl Ntg but usually has to take 2. Pain has increased in severity and frequency. This led to repeat cardiac cath.   Cardiac catheterization on 07/10/2020 demonstrated severe two-vessel CAD involving dominant RCA and previously stented mid  LAD.  Left main was widely patent.  I  personally reviewed films. LAD with segmental 60 to 70% mid stenosis proximal to a previously placed stent in the mid vessel.  Stent jailed a fairly large second diagonal branch with a 50% stenosis at the ostium. The third diagonal branch is modest in size with severe disease in the proximal and mid vessel.  Circumflex gives origin to 2 obtuse marginal branches which are free of any significant obstruction.    RCA dominant and totally occluded in the mid to distal segment and receives collaterals from right to right and left to right.      Since then medical therapy titrated. She had  terrible headaches related to increased Imdur. Ranexa made her feel severely dizzy and she couldn't sleep. She is on therapeutic beta blocker therapy and high dose amlodipine. She reported her quality of life is now very poor.   Because she had persistent symptoms despite optimal medical therapy she underwent successful CTO PCI of the RCA on 08/15/20. We did not treat the LAD at the time due to the duration of procedure and contrast but it was felt this could be done later if symptoms persist.   When seen back she continued to complain of chest pain and fatigue on exertion. She underwent PCI of the LAD with OCT guidance and DES in February.   On follow up she states she is doing very well. She is now able to walk for 60 minutes a day. No chest pain.  No significant dyspnea. Does note a little fatigue.     Past Medical History:  Diagnosis Date  . Anemia   . Coronary atherosclerosis of native coronary artery    Previous PCI 2007-2008, Dr. Sharyon Cable  . Essential hypertension, benign   . Mixed hyperlipidemia   . PAD (peripheral artery disease) (HCC)     Absent right DP  . Palpitations   . Type 2 diabetes mellitus ([place])   . Vasomotor rhinitis     Past Surgical History:  Procedure Laterality Date  . ABDOMINAL HYSTERECTOMY    . CARDIAC CATHETERIZATION N/A 08/17/2015   Procedure: Left Heart Cath and Coronary  Angiography;  Surgeon: Burnell Blanks, MD;  Location: [place] [facility];  Service: Cardiovascular;  Laterality: N/A;  . CORONARY ARTERY BYPASS GRAFT  2006   [name], single vessel  . CORONARY CTO INTERVENTION N/A 08/15/2020   Procedure: CORONARY CTO INTERVENTION;  Surgeon: [name], Abdalla Naramore M, MD;  Location: [place] [facility];  Service: Cardiovascular;  Laterality: N/A;  . CORONARY STENT INTERVENTION N/A 08/15/2020   Procedure: CORONARY STENT INTERVENTION;  Surgeon: [name], Nikayla Madaris M, MD;  Location: [place] [facility];  Service: Cardiovascular;  Laterality: N/A;  . CORONARY STENT INTERVENTION N/A 10/18/2020   Procedure: CORONARY STENT INTERVENTION;  Surgeon: [name], Allyssa Abruzzese M, MD;  Location: [place] [facility];  Service: Cardiovascular;  Laterality: N/A;  . CORONARY STENT INTERVENTION  10/18/2020  . INTRAVASCULAR ULTRASOUND/IVUS N/A 08/15/2020   Procedure: Intravascular Ultrasound/IVUS;  Surgeon: [name], Marlow Berenguer M, MD;  Location: [place] [facility];  Service: Cardiovascular;  Laterality: N/A;  . INTRAVASCULAR ULTRASOUND/IVUS N/A 10/18/2020   Procedure: Intravascular Ultrasound/IVUS;  Surgeon: [name], Reynol Arnone M, MD;  Location: [place] [facility];  Service: Cardiovascular;  Laterality: N/A;  . LEFT HEART CATH AND CORONARY ANGIOGRAPHY N/A 07/10/2020   Procedure: LEFT HEART CATH AND CORONARY ANGIOGRAPHY;  Surgeon: [name], MD;  Location: [place] [facility];  Service: Cardiovascular;  Laterality: N/A;    Current Outpatient Medications  Medication Sig Dispense Refill  . acetaminophen (TYLENOL) 500 MG tablet Take 500-1,000 mg by mouth every 6 (six) hours as needed for moderate pain.    Marland Kitchen amLODipine (NORVASC) 10 MG tablet Take 1 tablet (10 mg total) by mouth daily. 90 tablet 1  . Ascorbic Acid (VITAMIN C WITH ROSE HIPS) 500 MG tablet Take 1,000 mg by mouth daily.    Marland Kitchen aspirin 81 MG EC tablet Take 81 mg by mouth daily.    Marland Kitchen atorvastatin (LIPITOR) 80 MG tablet Take 1 tablet (80 mg total) by mouth daily.  90 tablet 3  . Calcium Carb-Cholecalciferol (CALCIUM PLUS VITAMIN D3) 600-500 MG-UNIT CAPS Take 1 tablet by mouth daily.    . cholecalciferol (VITAMIN D3) 25 MCG (1000 UNIT) tablet Take 1,000 Units by mouth daily. In the morning    . clopidogrel (PLAVIX) 75 MG tablet Take 1 tablet (75 mg total) by mouth daily. 90 tablet 3  . docusate sodium (COLACE) 100 MG capsule Take 100 mg by mouth daily as needed for mild constipation or moderate constipation.     Marland Kitchen eye wash (,SODIUM/POTASSIUM/SOD CHLORIDE,) SOLN Place 1 drop into both eyes 3 (three) times a week.    . ferrous gluconate (FERGON) 324 MG tablet Take 324 mg by mouth at bedtime.     . fexofenadine (ALLEGRA) 180 MG tablet Take 180 mg by mouth daily.    . Glucosamine-Chondroitin-MSM-D3 TABS Take 1 tablet by mouth daily.    . isosorbide mononitrate (IMDUR)  60 MG 24 hr tablet Take 60 mg by mouth daily.    Marland Kitchen losartan (COZAAR) 50 MG tablet Take 50 mg by mouth 2 (two) times daily.    . metFORMIN (GLUCOPHAGE) 500 MG tablet Take 500 mg by mouth every evening.     . Multiple Vitamin (MULTIVITAMIN WITH MINERALS) TABS tablet Take 1 tablet by mouth every evening.    . nebivolol (BYSTOLIC) 10 MG tablet Take 1 tablet (10 mg total) by mouth daily. 30 tablet 6  . nitroGLYCERIN (NITROLINGUAL) 0.4 MG/SPRAY spray Place 1 spray under the tongue every 5 (five) minutes x 3 doses as needed for chest pain (if no relief after 3rd dose, proceed to the ED for an evaluation). 4.9 g 0  . Omega-3 Fatty Acids (FISH OIL PO) Take 2,000 mg by mouth daily.    . pantoprazole (PROTONIX) 40 MG tablet Take 1 tablet (40 mg total) by mouth daily. 30 tablet 11   No current facility-administered medications for this visit.   Allergies:  Ranolazine er and Sulfonamide derivatives   Social History: The patient  reports that she has never smoked. She has never used smokeless tobacco. She reports that she does not drink alcohol and does not use drugs.   Family History: The patient's family  history includes CAD in an other family member; Cancer in her sister.   ROS:  Please see the history of present illness. Otherwise, complete review of systems is positive for none.  All other systems are reviewed and negative.   Physical Exam: VS:  BP 122/66   Pulse 70   Ht 5' 5.5" (1.664 m)   Wt 188 lb (85.3 kg)   SpO2 96%   BMI 30.81 kg/m , BMI Body mass index is 30.81 kg/m.  Wt Readings from Last 3 Encounters:  11/16/20 188 lb (85.3 kg)  10/29/20 190 lb (86.2 kg)  10/18/20 188 lb (85.3 kg)    General: WD BF in NAD Neck: Supple, no elevated JVP or carotid bruits, no thyromegaly. Lungs: Clear to auscultation, nonlabored breathing at rest. Cardiac: Regular rate and rhythm, no S3 or significant systolic murmur, no pericardial rub. Extremities: No  edema, left radial site bruised but with good pulse. No hematoma Skin: Warm and dry. Musculoskeletal: No kyphosis. Neuropsychiatric: Alert and oriented x3, affect grossly appropriate.    Recent Labwork: 10/19/2020: BUN 16; Creatinine, Ser 0.69; Hemoglobin 15.1; Platelets 190; Potassium 3.9; Sodium 137     Component Value Date/Time   CHOL 150 10/15/2020 0948   TRIG 113 10/15/2020 0948   HDL 51 10/15/2020 0948   CHOLHDL 2.9 10/15/2020 0948   CHOLHDL 3.4 08/17/2015 0412   VLDL 22 08/17/2015 0412   LDLCALC 79 10/15/2020 0948    Dated 6//3/21: cholesterol 142, triglycerides 125, HDL 48, LDL 69. LFTs and TSH normal.  Other Studies Reviewed Today:  07/10/2020 LEFT HEART CATH AND CORONARY ANGIOGRAPHY  Conclusion   Severe two-vessel coronary artery disease involving the dominant right coronary and the previously stented mid to distal LAD which involves to moderate to large diagonals.  Left main is widely patent  LAD contains segmental 60 to 70% mid stenosis proximal to a previously placed stent in the distal segment.  The stent jails a large and moderate sized second and third diagonals.  The third diagonal has proximal and mid  significant stenoses.  LAD supplies collaterals to the right coronary via septal perforators and around the apex.  Circumflex gives origin to 2 obtuse marginal branches which are free of  any significant obstruction.  The proximal circumflex contains eccentric 20% stenosis.  Ramus intermedius is large and widely patent  Right coronary is dominant and totally occluded in the mid to distal segment and receives collaterals from right to right and left to right.  Normal LV function.  LVEDP 8 mmHg.  EF 55%.  RECOMMENDATIONS:   We will discuss whether RCA is approachable by the CTO team.  Would up titrate nitrate therapy to improve angina.  If refractory symptoms, consider repeat coronary bypass to the second and third diagonals as well as the distal LAD.  Would also need to have LV branch of RCA and PDA grafted at the same time.  If only interventional approach is stenting of the mid LAD this would lead to a double layer of stent and will have limited long-term patency Diagnostic Dominance: Right    CTO PCI 08/15/20:  CORONARY STENT INTERVENTION  CORONARY CTO INTERVENTION  Intravascular Ultrasound/IVUS    Conclusion    3rd Diag-1 lesion is 90% stenosed.  3rd Diag-2 lesion is 70% stenosed.  Mid LAD lesion is 65% stenosed.  Mid LAD to Dist LAD lesion is 25% stenosed.  2nd Diag lesion is 45% stenosed.  Mid RCA to Dist RCA lesion is 100% stenosed.  A drug-eluting stent was successfully placed using a SYNERGY XD 3.50X38.  Ost RCA to Mid RCA lesion is 80% stenosed.  A drug-eluting stent was successfully placed using a SYNERGY XD 4.0X48.  RPDA lesion is 90% stenosed.  A drug-eluting stent was successfully placed using a SYNERGY XD 2.50X28.  A drug-eluting stent was successfully placed using a SYNERGY XD 4.0X12.  Post intervention, there is a 0% residual stenosis.  Post intervention, there is a 0% residual stenosis.  Post intervention, there is a 0% residual  stenosis.   1. Successful CTO PCI of the RCA from the proximal vessel into the PDA with DES x 4 and IVUS guidance.  Plan: DAPT indefinitely given extensive nature of stents. We will see how she does clinically. If she has persistent anginal symptoms we could address the stenosis in the LAD at a later time.   PCI 10/18/20:  CORONARY STENT INTERVENTION  Intravascular Ultrasound/IVUS    Conclusion    Mid LAD lesion is 75% stenosed.  Mid LAD to Dist LAD lesion is 35% stenosed.  A drug-eluting stent was successfully placed using a STENT RESOLUTE ONYX 2.5X34.  Post intervention, there is a 0% residual stenosis.  Previously placed RPDA drug eluting stent is widely patent.  Previously placed Mid RCA to Dist RCA drug eluting stent is widely patent.  Previously placed Ost RCA to Mid RCA drug eluting stent is widely patent.   1. Widely patent RCA vessel from prior CTO PCI 2. Successful PCI of the proximal to mid LAD with OCT guidance and DES x 1 with 2.5 x 34 mm Resolute stent post dilated with 2.75 mm [place] balloon to 18 atm  Plan: will observe overnight tonight. Continue DAPT indefinitely. Anticipate DC in am.   Assessment and Plan:   1. CAD in native artery . Now s/p CTO PCI of the RCA. More recent PCI of the proximal to mid LAD.  Status post CABG x 1 2006 with  LIMA-LAD. LIMA atretic on follow up one year later likely due to poor target and intramyocardial course. S/p DES of LAD in 2007.  Prior cardiac cath in 2012 and 2016 showed patent LAD stent and normal appearing dominant RCA.    Catheterization 07/10/2020 with  my interpretation as noted above.  Now s/p CTO PCI of the RCA. She has residual stenosis in the LAD and remained symptomatic so underwent PCI in February of the LAD. Symptomatically she is doing very well. Activity level is much better. Off Ranexa now. Will follow up with Dr Harl Bowie. May be able to taper antianginal therapy more if she continues to do well.   2. Essential  hypertension, benign Well controlled.  3. Mixed hyperlipidemia Continue Atorvastatin 40 mg po daily.Labs from PCP 02/09/2020: TC 142, TG 125, HDL 48, LDL 72. LDL 79. On high dose atorvastatin  4. Type 2 diabetes mellitus with other circulatory complication, without long-term current use of insulin ([place]) Managed by PCP .    Medication Adjustments/Labs and Tests Ordered: Current medicines are reviewed at length with the patient today.  Concerns regarding medicines are outlined above.   Disposition: follow up with Dr Harl Bowie. I will see as needed.  Signed, Shontae Rosiles [name] MD, [facility]   11/16/2020 1:52 PM

## 2020-11-16 ENCOUNTER — Other Ambulatory Visit: Payer: Self-pay

## 2020-11-16 ENCOUNTER — Encounter: Payer: Self-pay | Admitting: Cardiology

## 2020-11-16 ENCOUNTER — Ambulatory Visit (INDEPENDENT_AMBULATORY_CARE_PROVIDER_SITE_OTHER): Payer: Medicare Other | Admitting: Cardiology

## 2020-11-16 VITALS — BP 122/66 | HR 70 | Ht 65.5 in | Wt 188.0 lb

## 2020-11-16 DIAGNOSIS — I10 Essential (primary) hypertension: Secondary | ICD-10-CM | POA: Diagnosis not present

## 2020-11-16 DIAGNOSIS — I209 Angina pectoris, unspecified: Secondary | ICD-10-CM | POA: Diagnosis not present

## 2020-11-16 DIAGNOSIS — I2510 Atherosclerotic heart disease of native coronary artery without angina pectoris: Secondary | ICD-10-CM | POA: Diagnosis not present

## 2020-11-16 DIAGNOSIS — E782 Mixed hyperlipidemia: Secondary | ICD-10-CM

## 2020-11-16 DIAGNOSIS — E1159 Type 2 diabetes mellitus with other circulatory complications: Secondary | ICD-10-CM

## 2020-12-26 ENCOUNTER — Other Ambulatory Visit: Payer: Self-pay | Admitting: Cardiology

## 2020-12-27 ENCOUNTER — Telehealth: Payer: Self-pay

## 2020-12-27 MED ORDER — ATORVASTATIN CALCIUM 80 MG PO TABS: 80.0000 mg | ORAL_TABLET | Freq: Every day | ORAL | 3 refills | Status: DC

## 2020-12-27 NOTE — Telephone Encounter (Signed)
Medication refill request approved for Atorvastatin 80 mg tablets and sent to [facility].

## 2021-01-15 ENCOUNTER — Encounter (HOSPITAL_COMMUNITY): Payer: Self-pay | Admitting: *Deleted

## 2021-01-15 ENCOUNTER — Other Ambulatory Visit: Payer: Self-pay

## 2021-01-15 ENCOUNTER — Emergency Department (HOSPITAL_COMMUNITY)
Admission: EM | Admit: 2021-01-15 | Discharge: 2021-01-15 | Disposition: A | Discharge status: Home / Self Care | Payer: Medicare Other | Attending: Emergency Medicine | Admitting: Emergency Medicine

## 2021-01-15 ENCOUNTER — Emergency Department (HOSPITAL_COMMUNITY): Payer: Medicare Other

## 2021-01-15 DIAGNOSIS — I25119 Atherosclerotic heart disease of native coronary artery with unspecified angina pectoris: Secondary | ICD-10-CM | POA: Insufficient documentation

## 2021-01-15 DIAGNOSIS — Z7982 Long term (current) use of aspirin: Secondary | ICD-10-CM | POA: Insufficient documentation

## 2021-01-15 DIAGNOSIS — I10 Essential (primary) hypertension: Secondary | ICD-10-CM | POA: Diagnosis not present

## 2021-01-15 DIAGNOSIS — Z7984 Long term (current) use of oral hypoglycemic drugs: Secondary | ICD-10-CM | POA: Insufficient documentation

## 2021-01-15 DIAGNOSIS — Z7902 Long term (current) use of antithrombotics/antiplatelets: Secondary | ICD-10-CM | POA: Insufficient documentation

## 2021-01-15 DIAGNOSIS — Z951 Presence of aortocoronary bypass graft: Secondary | ICD-10-CM | POA: Insufficient documentation

## 2021-01-15 DIAGNOSIS — R319 Hematuria, unspecified: Secondary | ICD-10-CM | POA: Diagnosis present

## 2021-01-15 DIAGNOSIS — E119 Type 2 diabetes mellitus without complications: Secondary | ICD-10-CM | POA: Insufficient documentation

## 2021-01-15 DIAGNOSIS — Z79899 Other long term (current) drug therapy: Secondary | ICD-10-CM | POA: Diagnosis not present

## 2021-01-15 DIAGNOSIS — N23 Unspecified renal colic: Secondary | ICD-10-CM | POA: Diagnosis not present

## 2021-01-15 LAB — URINALYSIS, ROUTINE W REFLEX MICROSCOPIC
Bilirubin Urine: NEGATIVE
Glucose, UA: NEGATIVE mg/dL
Ketones, ur: 5 mg/dL — AB
Nitrite: NEGATIVE
Protein, ur: 30 mg/dL — AB
RBC / HPF: >50 RBC/hpf — ABNORMAL HIGH (ref 0–5)
Specific Gravity, Urine: 1.017 (ref 1.005–1.030)
pH: 5 (ref 5.0–8.0)

## 2021-01-15 LAB — CBC
HCT: 45 % (ref 36.0–46.0)
Hemoglobin: 15 g/dL (ref 12.0–15.0)
MCH: 30.1 pg (ref 26.0–34.0)
MCHC: 33.3 g/dL (ref 30.0–36.0)
MCV: 90.4 fL (ref 80.0–100.0)
Platelets: 202 10*3/uL (ref 150–400)
RBC: 4.98 MIL/uL (ref 3.87–5.11)
RDW: 13.7 % (ref 11.5–15.5)
WBC: 5.5 10*3/uL (ref 4.0–10.5)
nRBC: 0 % (ref 0.0–0.2)

## 2021-01-15 LAB — BASIC METABOLIC PANEL
Anion gap: 7 (ref 5–15)
BUN: 15 mg/dL (ref 8–23)
CO2: 23 mmol/L (ref 22–32)
Calcium: 9.2 mg/dL (ref 8.9–10.3)
Chloride: 105 mmol/L (ref 98–111)
Creatinine, Ser: 0.67 mg/dL (ref 0.44–1.00)
GFR, Estimated: >60 mL/min (ref >60)
Glucose, Bld: 121 mg/dL — ABNORMAL HIGH (ref 70–99)
Potassium: 4.1 mmol/L (ref 3.5–5.1)
Sodium: 135 mmol/L (ref 135–145)

## 2021-01-15 MED ORDER — HYDROCODONE-ACETAMINOPHEN 5-325 MG PO TABS: 1.0000 | ORAL_TABLET | ORAL | 0 refills | Status: DC | PRN

## 2021-01-15 MED ORDER — SODIUM CHLORIDE 0.9 % IV SOLN: INTRAVENOUS | Status: DC

## 2021-01-15 MED ORDER — TAMSULOSIN HCL 0.4 MG PO CAPS: 0.4000 mg | ORAL_CAPSULE | Freq: Every day | ORAL | 0 refills | Status: DC

## 2021-01-15 MED ORDER — ONDANSETRON 4 MG PO TBDP: 4.0000 mg | ORAL_TABLET | Freq: Three times a day (TID) | ORAL | 0 refills | Status: DC | PRN

## 2021-01-15 NOTE — ED Provider Notes (Signed)
[facility] EMERGENCY DEPARTMENT Provider Note   CSN: [id number] Arrival date & time: 01/15/21  1059     History Chief Complaint  Patient presents with  . Hematuria    Kimberly Flowers is a 80 y.o. female.  Pt presents to the ED today with blood in her urine.  The pt said she noticed the blood this morning.  The pt did have some back pain over the weekend.  No pain now.  She denies n/v.  No dysuria.  No f/c.  She did have a stent placed in her LAD in Feb.  She is currently on Plavix and ASA.          Past Medical History:  Diagnosis Date  . Anemia   . Coronary atherosclerosis of native coronary artery    Previous PCI 2007-2008, Dr. Sharyon Cable  . Essential hypertension, benign   . Mixed hyperlipidemia   . PAD (peripheral artery disease) (HCC)     Absent right DP  . Palpitations   . Type 2 diabetes mellitus ([place])   . Vasomotor rhinitis     Patient Active Problem List   Diagnosis Date Noted  . Angina pectoris ([name]) 10/18/2020  . Coronary artery disease involving native coronary artery of native heart with angina pectoris ([name])   . Unstable angina ([name]) 08/16/2015  . T2DM (type 2 diabetes mellitus) ([name]) 08/16/2015  . Aortic regurgitation 09/20/2012  . Palpitations 06/26/2009  . Coronary atherosclerosis of native coronary artery 06/26/2009  . Mixed hyperlipidemia 06/25/2009  . Essential hypertension, benign 06/25/2009    Past Surgical History:  Procedure Laterality Date  . ABDOMINAL HYSTERECTOMY    . CARDIAC CATHETERIZATION N/A 08/17/2015   Procedure: Left Heart Cath and Coronary Angiography;  Surgeon: Burnell Blanks, MD;  Location: [place] CV LAB;  Service: Cardiovascular;  Laterality: N/A;  . CORONARY ARTERY BYPASS GRAFT  2006   [name], single vessel  . CORONARY CTO INTERVENTION N/A 08/15/2020   Procedure: CORONARY CTO INTERVENTION;  Surgeon: [name], [name], MD;  Location: [place] [facility];  Service: Cardiovascular;  Laterality: N/A;  . CORONARY  STENT INTERVENTION N/A 08/15/2020   Procedure: CORONARY STENT INTERVENTION;  Surgeon: [name], [name], MD;  Location: [place] CV LAB;  Service: Cardiovascular;  Laterality: N/A;  . CORONARY STENT INTERVENTION N/A 10/18/2020   Procedure: CORONARY STENT INTERVENTION;  Surgeon: [name], [name], MD;  Location: [place] [facility] LAB;  Service: Cardiovascular;  Laterality: N/A;  . CORONARY STENT INTERVENTION  10/18/2020  . INTRAVASCULAR ULTRASOUND/IVUS N/A 08/15/2020   Procedure: Intravascular Ultrasound/IVUS;  Surgeon: [name], [name], MD;  Location: [place] [facility] LAB;  Service: Cardiovascular;  Laterality: N/A;  . INTRAVASCULAR ULTRASOUND/IVUS N/A 10/18/2020   Procedure: Intravascular Ultrasound/IVUS;  Surgeon: [name], [name], MD;  Location: [place] [facility] LAB;  Service: Cardiovascular;  Laterality: N/A;  . LEFT HEART CATH AND CORONARY ANGIOGRAPHY N/A 07/10/2020   Procedure: LEFT HEART CATH AND CORONARY ANGIOGRAPHY;  Surgeon: [name], MD;  Location: [place] [facility];  Service: Cardiovascular;  Laterality: N/A;     OB History   No obstetric history on file.     Family History  Problem Relation Age of Onset  . Cancer Sister        Breast  . CAD Other        Family history    Social History   Tobacco Use  . Smoking status: Never Smoker  . Smokeless tobacco: Never Used  Vaping Use  . Vaping  Use: Never used  Substance Use Topics  . Alcohol use: No    Alcohol/week: 0.0 standard drinks  . Drug use: No    Home Medications Prior to Admission medications   Medication Sig Start Date End Date Taking? Authorizing Provider  acetaminophen (TYLENOL) 500 MG tablet Take 500-1,000 mg by mouth every 6 (six) hours as needed for moderate pain.   Yes [provider]  amLODipine (NORVASC) 10 MG tablet TAKE 1 TABLET BY MOUTH EVERY DAY 12/27/20  Yes Branch, [name], MD  Ascorbic Acid (VITAMIN C WITH ROSE HIPS) 500 MG tablet Take 1,000 mg by mouth daily.   Yes [provider]   aspirin 81 MG EC tablet Take 81 mg by mouth daily.   Yes [provider]  atorvastatin (LIPITOR) 80 MG tablet Take 1 tablet (80 mg total) by mouth daily. 12/27/20 03/27/21 Yes BranchDorothe Pea, MD  Calcium Carb-Cholecalciferol (CALCIUM PLUS VITAMIN D3) 600-500 MG-UNIT CAPS Take 1 tablet by mouth daily.   Yes [provider]  cholecalciferol (VITAMIN D3) 25 MCG (1000 UNIT) tablet Take 1,000 Units by mouth daily. In the morning   Yes [provider]  clopidogrel (PLAVIX) 75 MG tablet Take 1 tablet (75 mg total) by mouth daily. 08/09/20  Yes [name], [name], MD  docusate sodium (COLACE) 100 MG capsule Take 100 mg by mouth daily as needed for mild constipation or moderate constipation.    Yes [provider]  eye wash (,SODIUM/POTASSIUM/SOD CHLORIDE,) SOLN Place 1 drop into both eyes 3 (three) times a week.   Yes [provider]  ferrous gluconate (FERGON) 324 MG tablet Take 324 mg by mouth at bedtime.    Yes [provider]  fexofenadine (ALLEGRA) 180 MG tablet Take 180 mg by mouth daily.   Yes [provider]  Glucosamine-Chondroitin-MSM-D3 TABS Take 1 tablet by mouth daily.   Yes [provider]  HYDROcodone-acetaminophen (NORCO/VICODIN) 5-325 MG tablet Take 1 tablet by mouth every 4 (four) hours as needed. 01/15/21  Yes [name], MD  isosorbide mononitrate (IMDUR) 60 MG 24 hr tablet TAKE 1 TABLET BY MOUTH EVERY DAY 12/27/20  Yes Branch, [name], MD  losartan (COZAAR) 50 MG tablet Take 50 mg by mouth 2 (two) times daily.   Yes [provider]  metFORMIN (GLUCOPHAGE) 500 MG tablet Take 500 mg by mouth every evening.    Yes [provider]  Multiple Vitamin (MULTIVITAMIN WITH MINERALS) TABS tablet Take 1 tablet by mouth every evening.   Yes [provider]  nebivolol (BYSTOLIC) 10 MG tablet Take 1 tablet (10 mg total) by mouth daily. 07/20/20  Yes [name], NP  nitroGLYCERIN  (NITROLINGUAL) 0.4 MG/SPRAY spray Place 1 spray under the tongue every 5 (five) minutes x 3 doses as needed for chest pain (if no relief after 3rd dose, proceed to the ED for an evaluation). 11/18/19  Yes Laqueta Linden, MD  Omega-3 Fatty Acids (FISH OIL PO) Take 2,000 mg by mouth daily.   Yes [provider]  ondansetron (ZOFRAN ODT) 4 MG disintegrating tablet Take 1 tablet (4 mg total) by mouth every 8 (eight) hours as needed for nausea or vomiting. 01/15/21  Yes [name], MD  pantoprazole (PROTONIX) 40 MG tablet Take 1 tablet (40 mg total) by mouth daily. 08/09/20  Yes [name], [name], MD  tamsulosin (FLOMAX) 0.4 MG CAPS capsule Take 1 capsule (0.4 mg total) by mouth daily. 01/15/21  Yes [name], MD  Allergies    Ranolazine er and Sulfonamide derivatives  Review of Systems   Review of Systems  Genitourinary: Positive for hematuria.  All other systems reviewed and are negative.   Physical Exam Updated Vital Signs BP 132/71 (BP Location: Right Arm)   Pulse 67   Temp 98.8 F (37.1 C) (Oral)   Resp 18   Ht 5' 5.5" (1.664 m)   Wt 83.5 kg   SpO2 96%   BMI 30.15 kg/m   Physical Exam Vitals and nursing note reviewed.  Constitutional:      Appearance: Normal appearance.  HENT:     Head: Normocephalic and atraumatic.     Right Ear: External ear normal.     Left Ear: External ear normal.     Nose: Nose normal.     Mouth/Throat:     Mouth: Mucous membranes are moist.     Pharynx: Oropharynx is clear.  Eyes:     Extraocular Movements: Extraocular movements intact.     Conjunctiva/sclera: Conjunctivae normal.     Pupils: Pupils are equal, round, and reactive to light.  Cardiovascular:     Rate and Rhythm: Normal rate and regular rhythm.     Pulses: Normal pulses.     Heart sounds: Normal heart sounds.  Pulmonary:     Effort: Pulmonary effort is normal.     Breath sounds: Normal breath sounds.  Abdominal:     General: Abdomen is flat. Bowel sounds  are normal.     Palpations: Abdomen is soft.  Musculoskeletal:        General: Normal range of motion.     Cervical back: Normal range of motion and neck supple.  Skin:    General: Skin is warm.     Capillary Refill: Capillary refill takes less than 2 seconds.  Neurological:     General: No focal deficit present.     Mental Status: She is alert and oriented to person, place, and time.  Psychiatric:        Mood and Affect: Mood normal.        Behavior: Behavior normal.        Thought Content: Thought content normal.        Judgment: Judgment normal.     ED Results / Procedures / Treatments   Labs (all labs ordered are listed, but only abnormal results are displayed) Labs Reviewed  BASIC METABOLIC PANEL - Abnormal; Notable for the following components:      Result Value   Glucose, Bld 121 (*)    All other components within normal limits  URINALYSIS, ROUTINE W REFLEX MICROSCOPIC - Abnormal; Notable for the following components:   Color, Urine AMBER (*)    APPearance CLOUDY (*)    Hgb urine dipstick LARGE (*)    Ketones, ur 5 (*)    Protein, ur 30 (*)    Leukocytes,Ua TRACE (*)    RBC / HPF >50 (*)    Bacteria, UA RARE (*)    All other components within normal limits  URINE CULTURE  CBC    EKG None  Radiology CT RENAL STONE STUDY  Result Date: 01/15/2021 CLINICAL DATA:  Lower back pain with hematuria x2 weeks recent UTI EXAM: CT ABDOMEN AND PELVIS WITHOUT CONTRAST TECHNIQUE: Multidetector CT imaging of the abdomen and pelvis was performed following the standard protocol without IV contrast. COMPARISON:  None. FINDINGS: Lower chest: Prior median sternotomy and CABG. Scattered atelectasis. Elevation the right hemidiaphragm. Hepatobiliary: Unremarkable noncontrast appearance of the hepatic  parenchyma. Gallbladder surgically absent. No biliary ductal dilation. Pancreas: Within normal limits. Spleen: Within normal limits. Adrenals/Urinary Tract: Low-density 1.4 cm right adrenal  nodule consistent with an adrenal adenoma. Thickening of the left adrenal gland without discrete nodularity. Mild left lower pole hydronephrosis to the level of a 4 mm stone at the UVJ. No right-sided hydronephrosis. No right renal stones or contour deforming renal masses. Homogeneous hypodense 2.2 cm left interpolar renal lesion with a thin rim of calcification along the medial aspect of the wall of the lesion favored represent a Bosniak category 2 cyst but incompletely evaluated without intravenous contrast. Urinary bladder is grossly unremarkable for degree of distension Stomach/Bowel: Small hiatal hernia otherwise the stomach is grossly unremarkable for degree of distension. Normal positioning of the duodenum/ligament of [place]. No pathologic small bowel dilation. Appendix is not definitely visualized however there is no pericecal inflammation. Vascular/Lymphatic: Aortic atherosclerosis without aneurysmal dilation. No enlarged abdominal or pelvic lymph nodes. Reproductive: Status post hysterectomy. No adnexal masses. Other: No abdominopelvic ascites. Musculoskeletal: Multilevel degenerative changes spine. Diffuse demineralization of bone. No acute osseous abnormality. IMPRESSION: 1. Mild left hydronephrosis to the level of a 4 mm stone at the left UVJ. 2. Homogeneous hypodense 2.2 cm left interpolar renal lesion with a thin rim of calcification along the medial aspect of the wall of the lesion favored represent a Bosniak category 2 cyst but incompletely evaluated without intravenous contrast. Consider further evaluation with nonemergent renal protocol CT or MRI with without contrast. 3. Small hiatal hernia. 4. Aortic atherosclerosis.  Aortic Atherosclerosis (ICD10-I70.0). Electronically Signed   By: Dahlia Bailiff MD   On: 01/15/2021 13:24    Procedures Procedures   Medications Ordered in ED Medications  0.9 %  sodium chloride infusion ( Intravenous New Bag/Given 01/15/21 1249)    ED Course  I have  reviewed the triage vital signs and the nursing notes.  Pertinent labs & imaging results that were available during my care of the patient were reviewed by me and considered in my medical decision making (see chart for details).    MDM Rules/Calculators/A&P                          Pt does have a kidney stone.  No uti.  Pain is very mild.  She is stable for d/c.  She is to f/u with urology.  Return if worse. Final Clinical Impression(s) / ED Diagnoses Final diagnoses:  Renal colic on left side    Rx / DC Orders ED Discharge Orders         Ordered    ondansetron (ZOFRAN ODT) 4 MG disintegrating tablet  Every 8 hours PRN        01/15/21 1357    tamsulosin (FLOMAX) 0.4 MG CAPS capsule  Daily        01/15/21 1357    HYDROcodone-acetaminophen (NORCO/VICODIN) 5-325 MG tablet  Every 4 hours PRN        01/15/21 1357           Isla Pence, MD 01/15/21 1358

## 2021-01-15 NOTE — ED Triage Notes (Signed)
Blood in urine onset this am

## 2021-01-17 LAB — URINE CULTURE: Culture: NO GROWTH

## 2021-02-19 ENCOUNTER — Telehealth: Payer: Self-pay | Admitting: Cardiology

## 2021-02-19 NOTE — Telephone Encounter (Signed)
   [facility] Pre-operative Risk Assessment    Patient Name: TYLIN STRADLEY  DOB: Feb 02, 1941  MRN: [id number]   HEARTCARE STAFF: - Please ensure there is not already an duplicate clearance open for this procedure. - Under Visit Info/Reason for Call, type in Other and utilize the format Clearance MM/DD/YY or Clearance TBD. Do not use dashes or single digits. - If request is for dental extraction, please clarify the # of teeth to be extracted. - If the patient is currently at the dentist's office, call Pre-Op APP to address. If the patient is not currently in the dentist office, please route to the Pre-Op pool  Request for surgical clearance:  What type of surgery is being performed?  Mammo Digital Diagnostic Tomo Right W CAD  When is this surgery scheduled?  June 29    What type of clearance is required (medical clearance vs. Pharmacy clearance to hold med vs. Both)?  PHARMACY   Are there any medications that need to be held prior to surgery and how long? BLOOD THINNER  Practice name and name of physician performing surgery?  [facility]  What is the office phone number? N/A   7.   What is the office fax number?          1.  N/A   8.   Anesthesia type (None, local, MAC, general) ?          1.  N/A   Patient dropped off form  Desma Paganini 02/19/2021, 4:34 PM  _________________________________________________________________   (provider comments below)

## 2021-02-20 ENCOUNTER — Telehealth: Payer: Self-pay | Admitting: *Deleted

## 2021-02-20 NOTE — Telephone Encounter (Signed)
   [facility] Pre-operative Risk Assessment    Patient Name: Kimberly Flowers  DOB: [birth year]  MRN: [id number]   HEARTCARE STAFF: - Please ensure there is not already an duplicate clearance open for this procedure. - Under Visit Info/Reason for Call, type in Other and utilize the format Clearance MM/DD/YY or Clearance TBD. Do not use dashes or single digits. - If request is for dental extraction, please clarify the # of teeth to be extracted. - If the patient is currently at the dentist's office, call Pre-Op APP to address. If the patient is not currently in the dentist office, please route to the Pre-Op pool  Request for surgical clearance:  What type of surgery is being performed? Ultrasound Breast Core Biopsy of Right Breast.   When is this surgery scheduled? 03/06/2021   What type of clearance is required (medical clearance vs. Pharmacy clearance to hold med vs. Both)? Medical  Are there any medications that need to be held prior to surgery and how long?Plavix    Practice name and name of physician performing surgery? [facility]  Everlean Alstrom   What is the office phone number? [phone]   7.   What is the office fax number?           [phone]  8.   Anesthesia type (None, local, MAC, general) ? Local   Barbaraann Barthel 02/20/2021, 1:50 PM  _________________________________________________________________   (provider comments below)

## 2021-02-20 NOTE — Telephone Encounter (Signed)
   Name:  Kimberly Flowers  DOB:  02-08-1941  MRN:  [id number]   Primary Cardiologist: [name], MD  Chart reviewed as part of pre-operative protocol coverage. Kimberly Flowers recently underwent PCI/DES of the LAD 10/2020 and has a hx of multiple PCIs. Due to recent PCI, she will need to remain on DAPT with ASA and Plavix for at least 6-12 months prior to interruption.   Pre-op covering staff: - Please contact requesting surgeon's office via preferred method (i.e, phone, fax) to inform them of need to postpone surgery unless they are able to perform the procedure on DAPT.   Kathyrn Drown, NP 02/20/2021, 2:41 PM

## 2021-02-21 NOTE — Telephone Encounter (Signed)
See 6/15 clearance note for complete recs. Pt on antiplatelet therapy not anticoagulation so PharmD input is not needed.

## 2021-02-21 NOTE — Telephone Encounter (Signed)
Abigail Butts with [place] office called back in regards to Plavix and ASA clearance. Per Abigail Butts, states the Radiologist agreed if not Cardiologist does not feel safe for pt to hold Plavix and ASA for procedure, they agree and would not want to put pt at risk for cardiac event. Abigail Butts, states the Radiologist does state ok to preform procedure on ASA and Plavix, knowing they will have to hold pressure longer at the site and risk possible hematoma, though would prefer this over risking a cardiac event. I confirmed with Abigail Butts, she is asking for cardiologist recommendations if agreeable to procedure being performed while pt on ASA and Plavix. I assure Abigail Butts that I will make notes and fax. I confirmed fax# with Abigail Butts, I stated the fax # that we had was [phone]; Abigail Butts states that is not one of their fax #'s and said that was the fax she was given to fax clearance over to our office. Confirmed correct fax #: [phone]. I assured Abigail Butts that I will fax these notes to her now and will fax any new recommendations once I hear back from the Cardiologist.

## 2021-02-21 NOTE — Telephone Encounter (Signed)
Left verbal message for Kimberly Flowers to call back in regards to clearance request for the pt. Please read the notes from the pre op provider. Left my number [phone] for Kimberly Flowers to call back.

## 2021-02-21 NOTE — Telephone Encounter (Signed)
Pre-op call back, is this something you can help find out for [place]?

## 2021-02-21 NOTE — Telephone Encounter (Signed)
See phone note from 02/20/21

## 2021-02-21 NOTE — Telephone Encounter (Signed)
Does this need a hold or is a screening mammogram?

## 2021-02-21 NOTE — Telephone Encounter (Signed)
Dr. Harl Bowie  Are you ok with this patient undergoing US breast core biopsy while on ASA and Plavix. Initially request was sent to hold these medications for the procedure. She recently underwent PCI/DES of the LAD 10/2020 and has a hx of multiple PCIs. Due to recent PCI, she was instructed to remain on DAPT with ASA and Plavix for at least 6-12 months prior to interruption.   Radiologist returned the request stating that they are ok to preform the procedure while on ASA and Plavix without interruption.   Are you ok with this?   Please send your recs to the pre op pool  Thank you

## 2021-02-25 NOTE — Telephone Encounter (Signed)
[place] for biopsy if remains on aspirin and plavix  Zandra Abts MD

## 2021-02-25 NOTE — Telephone Encounter (Signed)
    Kimberly Flowers DOB:  08/04/1941  MRN:  [id number]   Primary Cardiologist: [name], MD  Chart reviewed as part of pre-operative protocol coverage. Patient has a history of CAD with multiple PCIs. She underwent PCI with DES x4 from proximal RCA into the PDA in 08/2020 and more recently PCI /DES of the LAD in 10/2020. Called and spoke with patient today. She has still has some chronic chest pain that responds to Nitro but this is stable and has actually improved some after most recent PCI. Per Dr. Harl Bowie, patient is OK to proceed with breast biopsy as long as it can be done without stopping Aspirin and Plavix. Dr. Shelly Bombard said that this could be done. I notified patient that she is OK to have biopsy but should continue her Aspirin and Plavix beforehand. She voiced understanding.  I will route this recommendation to the requesting party via Epic fax function and remove from pre-op pool.  Please call with questions.  Darreld Mclean, PA-C 02/25/2021, 1:47 PM

## 2021-02-25 NOTE — Telephone Encounter (Signed)
I wil send back to pre op pool with note from the surgeon. Pre op provider is waiting on input from Dr. Harl Bowie. Once pt has been cleared our office will  fax notes to surgeon's office.

## 2021-03-21 ENCOUNTER — Other Ambulatory Visit: Payer: Self-pay | Admitting: General Surgery

## 2021-03-21 DIAGNOSIS — Z170 Estrogen receptor positive status [ER+]: Secondary | ICD-10-CM | POA: Insufficient documentation

## 2021-03-21 DIAGNOSIS — C50311 Malignant neoplasm of lower-inner quadrant of right female breast: Secondary | ICD-10-CM

## 2021-03-22 ENCOUNTER — Telehealth: Payer: Self-pay | Admitting: *Deleted

## 2021-03-22 NOTE — Telephone Encounter (Signed)
Since she is more than 6 months out from stenting and surgery is of an urgent nature I think she could hold Plavix for surgery for 5 days. Should continue ASA throughout. Resume plavix post op  Siddh Vandeventer [name] MD, [facility]

## 2021-03-22 NOTE — Telephone Encounter (Signed)
    Patient Name: Kimberly Flowers  DOB: [birth year] MRN: [id number]  Primary Cardiologist: [name], MD  Chart reviewed as part of pre-operative protocol coverage. Patient has an extensive CAD history and most recently underwent successful PCI/DES of RCA/PDA CTO 08/2020 with 4 overlapping stents followed by PCI/DES to LAD with OCT guidance 10/2020 given residual anginal complaints; both performed by Dr. [name]. She is on aspirin and plavix. 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   Dr. [name], can you comment on recommendations for holding plavix in anticipation of her lumpectomy? Suspect this should be done sooner rather than later. Whats the earliest she can hold given somewhat recent interventions? Please route your response back to P CV DIV PREOP.   Abigail Butts, PA-C 03/22/2021, 11:27 AM

## 2021-03-22 NOTE — Telephone Encounter (Signed)
   [facility] Pre-operative Risk Assessment    Patient Name: Kimberly Flowers  DOB: Feb 06, 1941 MRN: [id number]  HEARTCARE STAFF:  - IMPORTANT!!!!!! Under Visit Info/Reason for Call, type in Other and utilize the format Clearance MM/DD/YY or Clearance TBD. Do not use dashes or single digits. - Please review there is not already an duplicate clearance open for this procedure. - If request is for dental extraction, please clarify the # of teeth to be extracted. - If the patient is currently at the dentist's office, call Pre-Op Callback Staff (MA/nurse) to input urgent request.  - If the patient is not currently in the dentist office, please route to the Pre-Op pool.  Request for surgical clearance:  What type of surgery is being performed? Right Breast Lumpectomy  When is this surgery scheduled? TBD  What type of clearance is required (medical clearance vs. Pharmacy clearance to hold med vs. Both)? Both  Are there any medications that need to be held prior to surgery and how long? Plavix (specify hold duration)  Practice name and name of physician performing surgery? [facility]/Dr. Stark Klein  What is the office phone number? [phone]   7.   What is the office fax number? [phone]  8.   Anesthesia type (None, local, MAC, general) ? general   Marlou Sa 03/22/2021, 7:57 AM  _________________________________________________________________   Fax cardiac clearance to Attn: Emeline Gins CMA

## 2021-03-22 NOTE — Telephone Encounter (Signed)
   Name: Kimberly Flowers  DOB: 10/20/1940  MRN: [id number]   Primary Cardiologist: [name], MD  Chart reviewed as part of pre-operative protocol coverage. Patient was contacted 03/22/2021 in reference to pre-operative risk assessment for pending surgery as outlined below.  Kimberly Flowers was last seen on 11/16/20 by Dr. [name].  Since that day, Kimberly Flowers has continued to have intermittent chest pains which are relieved with 1, sometimes 2 SL nitro. She has not noticed any escalation of symptoms in recent weeks/months. She has chronic DOE which is unchanged. She is able to complete 4 METs, though does have intermittent chest pain with these activities (walking, mopping/vacuuming, or changing the sheets on her bed). 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 Do not feel strongly about further cardiac work-up prior to her upcoming procedure, however will route to Dr. Harl Bowie for his input.  Per Dr. [name], given patient is 6 months out from her last PCI, patient can hold plavix 5 days prior to her upcoming surgery with plans to restart when cleared to do so by her surgeon. She should remain on aspirin throughout the perioperative setting.   Once I hear back from Dr. Harl Bowie, will finalize preop recommendations and route to the requesting surgeons office.  Abigail Butts, PA-C 03/22/2021, 1:07 PM

## 2021-03-27 NOTE — Telephone Encounter (Signed)
From my view ok to proceed with standpoint. Has some chronic stable angina that is stable. Would not delay her neccesary procedure. DAPT management as recommended by Dr [name]  J Zipporah Finamore MD

## 2021-03-27 NOTE — Telephone Encounter (Signed)
   Name: Kimberly Flowers  DOB: 1941-03-27  MRN: [id number]   Primary Cardiologist: [name], MD  Chart reviewed as part of pre-operative protocol coverage on 03/22/21 by Roby Lofts, PA-C.   Kimberly Flowers was last seen on 11/16/20 by Dr. [name].  Since that day, she reports unchanged intermittent chest pains which are relieved with 1, sometimes 2 SL nitro. She has not noticed any escalation of symptoms in recent weeks/months. She has chronic DOE which is unchanged. She is able to complete 4 METs, though does have intermittent chest pain with these activities (walking, mopping/vacuuming, or changing the sheets on her bed).   Per Dr. Harl Bowie, pt sx as above are stable and  OK to proceed with surgery as would not recommend delaying the urgent procedure.  Per. Dr. [name], the pt should hold Plavix 5 days prior to her surgery with restart per her surgeon's recommendations. She should remain on ASA throughout the perioperative setting.   I will route this recommendation to the requesting party via Epic fax function and remove from pre-op pool. Please call with questions.  Arvil Chaco, PA-C 03/27/2021, 9:57 AM

## 2021-04-08 ENCOUNTER — Other Ambulatory Visit: Payer: Self-pay | Admitting: General Surgery

## 2021-04-08 DIAGNOSIS — Z170 Estrogen receptor positive status [ER+]: Secondary | ICD-10-CM

## 2021-04-10 NOTE — Progress Notes (Signed)
Surgical Instructions    Your procedure is scheduled on 04/16/21.  Report to [facility] Main Entrance "A" at 2:00 P.M., then check in with the Admitting office.  Call this number if you have problems the morning of surgery:  [phone]   If you have any questions prior to your surgery date call [phone]: Open Monday-Friday 8am-4pm    Remember:  Do not eat after midnight the night before your surgery  You may drink clear liquids until 1:00 PM the morning of your surgery.   Clear liquids allowed are: Water, Non-Citrus Juices (without pulp), Carbonated Beverages, Clear Tea, Black Coffee Only, and Gatorade  Patient Instructions  The night before surgery:  No food after midnight. ONLY clear liquids after midnight   The day of surgery (if you have diabetes): Drink ONE (1) 12 oz G2 given to you in your pre admission testing appointment by 1:00 PM the morning of surgery. Drink in one sitting. Do not sip.  This drink was given to you during your hospital  pre-op appointment visit.  Nothing else to drink after completing the  12 oz bottle of G2.         If you have questions, please contact your surgeon's office.     Take these medicines the morning of surgery with A SIP OF WATER  amLODipine (NORVASC)  atorvastatin (LIPITOR) fexofenadine (ALLEGRA)  isosorbide mononitrate (IMDUR)  nebivolol (BYSTOLIC)  pantoprazole (PROTONIX)  IF NEEDED: acetaminophen (TYLENOL) docusate sodium (COLACE) Eye wash nitroGLYCERIN ondansetron (ZOFRAN ODT)  As of today, STOP taking any Aspirin (unless otherwise instructed by your surgeon) Aleve, Naproxen, Ibuprofen, Motrin, Advil, Goody's, BC's, all herbal medications, fish oil, and all vitamins.  Please stop taking clopidogrel (PLAVIX) 5 days prior to surgery. Your last dose will be 04/10/21.  WHAT DO I DO ABOUT MY DIABETES MEDICATION?   Do not take oral diabetes medicines (pills) the morning of surgery.  The day of surgery, do not take  other diabetes injectables, including Byetta (exenatide), Bydureon (exenatide ER), Victoza (liraglutide), or Trulicity (dulaglutide).  If your CBG is greater than 220 mg/dL, you may take  of your sliding scale (correction) dose of insulin.   HOW TO MANAGE YOUR DIABETES BEFORE AND AFTER SURGERY  Why is it important to control my blood sugar before and after surgery? Improving blood sugar levels before and after surgery helps healing and can limit problems. A way of improving blood sugar control is eating a healthy diet by:  Eating less sugar and carbohydrates  Increasing activity/exercise  Talking with your doctor about reaching your blood sugar goals High blood sugars (greater than 180 mg/dL) can raise your risk of infections and slow your recovery, so you will need to focus on controlling your diabetes during the weeks before surgery. Make sure that the doctor who takes care of your diabetes knows about your planned surgery including the date and location.  How do I manage my blood sugar before surgery? Check your blood sugar at least 4 times a day, starting 2 days before surgery, to make sure that the level is not too high or low.  Check your blood sugar the morning of your surgery when you wake up and every 2 hours until you get to the Short Stay unit.  If your blood sugar is less than 70 mg/dL, you will need to treat for low blood sugar: Do not take insulin. Treat a low blood sugar (less than 70 mg/dL) with  cup of clear juice (cranberry or apple),  4 glucose tablets, OR glucose gel. Recheck blood sugar in 15 minutes after treatment (to make sure it is greater than 70 mg/dL). If your blood sugar is not greater than 70 mg/dL on recheck, call [phone] for further instructions. Report your blood sugar to the short stay nurse when you get to Short Stay.  If you are admitted to the hospital after surgery: Your blood sugar will be checked by the staff and you will probably be given  insulin after surgery (instead of oral diabetes medicines) to make sure you have good blood sugar levels. The goal for blood sugar control after surgery is 80-180 mg/dL.           Do not wear jewelry or makeup Do not wear lotions, powders, perfumes/colognes, or deodorant. Do not shave 48 hours prior to surgery.   Do not bring valuables to the hospital.  DO Not wear nail polish, gel polish, artificial nails, or any other type of covering on natural nails  including finger and toenails. If patients have artificial nails, gel coating, etc. that need to be removed by a nail salon please have this removed prior to surgery or surgery may need to be canceled/delayed if the surgeon/ anesthesia feels like the patient is unable to be adequately monitored.             [facility] is not responsible for any belongings or valuables.  Do NOT Smoke (Tobacco/Vaping) or drink Alcohol 24 hours prior to your procedure If you use a CPAP at night, you may bring all equipment for your overnight stay.   Contacts, glasses, dentures or bridgework may not be worn into surgery, please bring cases for these belongings   For patients admitted to the hospital, discharge time will be determined by your treatment team.   Patients discharged the day of surgery will not be allowed to drive home, and someone needs to stay with them for 24 hours.  ONLY 1 SUPPORT PERSON MAY BE PRESENT WHILE YOU ARE IN SURGERY. IF YOU ARE TO BE ADMITTED ONCE YOU ARE IN YOUR ROOM YOU WILL BE ALLOWED TWO (2) VISITORS.  Minor children may have two parents present. Special consideration for safety and communication needs will be reviewed on a case by case basis.  Special instructions:    Oral Hygiene is also important to reduce your risk of infection.  Remember - BRUSH YOUR TEETH THE MORNING OF SURGERY WITH YOUR REGULAR TOOTHPASTE   [place]- Preparing For Surgery  Before surgery, you can play an important role. Because skin is not  sterile, your skin needs to be as free of germs as possible. You can reduce the number of germs on your skin by washing with CHG (chlorahexidine gluconate) Soap before surgery.  CHG is an antiseptic cleaner which kills germs and bonds with the skin to continue killing germs even after washing.     Please do not use if you have an allergy to CHG or antibacterial soaps. If your skin becomes reddened/irritated stop using the CHG.  Do not shave (including legs and underarms) for at least 48 hours prior to first CHG shower. It is OK to shave your face.  Please follow these instructions carefully.     Shower the NIGHT BEFORE SURGERY and the MORNING OF SURGERY with CHG Soap.   If you chose to wash your hair, wash your hair first as usual with your normal shampoo. After you shampoo, rinse your hair and body thoroughly to remove the shampoo.  Then [employer] and genitals (private parts) with your normal soap and rinse thoroughly to remove soap.  After that Use CHG Soap as you would any other liquid soap. You can apply CHG directly to the skin and wash gently with a scrungie or a clean washcloth.   Apply the CHG Soap to your body ONLY FROM THE NECK DOWN.  Do not use on open wounds or open sores. Avoid contact with your eyes, ears, mouth and genitals (private parts). Wash Face and genitals (private parts)  with your normal soap.   Wash thoroughly, paying special attention to the area where your surgery will be performed.  Thoroughly rinse your body with warm water from the neck down.  DO NOT shower/wash with your normal soap after using and rinsing off the CHG Soap.  Pat yourself dry with a CLEAN TOWEL.  Wear CLEAN PAJAMAS to bed the night before surgery  Place CLEAN SHEETS on your bed the night before your surgery  DO NOT SLEEP WITH PETS.   Day of Surgery: Take a shower with CHG soap. Wear Clean/Comfortable clothing the morning of surgery Do not apply any deodorants/lotions.   Remember to  brush your teeth WITH YOUR REGULAR TOOTHPASTE.   Please read over the following fact sheets that you were given.

## 2021-04-11 ENCOUNTER — Encounter (HOSPITAL_COMMUNITY)
Admission: RE | Admit: 2021-04-11 | Discharge: 2021-04-11 | Disposition: A | Discharge status: Home / Self Care | Payer: Medicare Other | Source: Ambulatory Visit | Attending: General Surgery | Admitting: General Surgery

## 2021-04-11 ENCOUNTER — Encounter (HOSPITAL_COMMUNITY): Payer: Self-pay

## 2021-04-11 ENCOUNTER — Other Ambulatory Visit: Payer: Self-pay

## 2021-04-11 DIAGNOSIS — Z01812 Encounter for preprocedural laboratory examination: Secondary | ICD-10-CM | POA: Insufficient documentation

## 2021-04-11 HISTORY — DX: Personal history of urinary calculi: Z87.442

## 2021-04-11 LAB — CBC
HCT: 42.7 % (ref 36.0–46.0)
Hemoglobin: 14.2 g/dL (ref 12.0–15.0)
MCH: 30.2 pg (ref 26.0–34.0)
MCHC: 33.3 g/dL (ref 30.0–36.0)
MCV: 90.9 fL (ref 80.0–100.0)
Platelets: 205 10*3/uL (ref 150–400)
RBC: 4.7 MIL/uL (ref 3.87–5.11)
RDW: 13.1 % (ref 11.5–15.5)
WBC: 6.7 10*3/uL (ref 4.0–10.5)
nRBC: 0 % (ref 0.0–0.2)

## 2021-04-11 LAB — BASIC METABOLIC PANEL
Anion gap: 9 (ref 5–15)
BUN: 14 mg/dL (ref 8–23)
CO2: 25 mmol/L (ref 22–32)
Calcium: 9.1 mg/dL (ref 8.9–10.3)
Chloride: 103 mmol/L (ref 98–111)
Creatinine, Ser: 0.71 mg/dL (ref 0.44–1.00)
GFR, Estimated: >60 mL/min (ref >60)
Glucose, Bld: 109 mg/dL — ABNORMAL HIGH (ref 70–99)
Potassium: 4.2 mmol/L (ref 3.5–5.1)
Sodium: 137 mmol/L (ref 135–145)

## 2021-04-11 LAB — HEMOGLOBIN A1C
Hgb A1c MFr Bld: 6.7 % — ABNORMAL HIGH (ref 4.8–5.6)
Mean Plasma Glucose: 145.59 mg/dL

## 2021-04-11 LAB — GLUCOSE, CAPILLARY: Glucose-Capillary: 116 mg/dL — ABNORMAL HIGH (ref 70–99)

## 2021-04-11 NOTE — Progress Notes (Signed)
PCP - [name] MD Cardiologist -   PPM/ICD - denies Device Orders -  Rep Notified -   Chest x-ray - none EKG - 10/18/20 Stress Test -  ECHO -  Cardiac Cath -   Sleep Study - no CPAP - no  Fasting Blood Sugar -120-130  Checks Blood Sugar 5 times a week  Blood Thinner Instructions:stop Plavix 5 days before surgery. Last dose 04/10/21. Aspirin Instructions:n/a  ERAS Protcol -clears until 1:00pm PRE-SURGERY Ensure or G2- G2  COVID TEST- N/A ambulatory surgery   Anesthesia review: yes, cardiac history. Seed patient.   Patient denies shortness of breath, fever, cough and chest pain at PAT appointment   All instructions explained to the patient, with a verbal understanding of the material. Patient agrees to go over the instructions while at home for a better understanding. Patient also instructed to self quarantine after being tested for COVID-19. The opportunity to ask questions was provided.

## 2021-04-12 NOTE — Anesthesia Preprocedure Evaluation (Addendum)
Anesthesia Evaluation  Patient identified by MRN, date of birth, ID band Patient awake    Reviewed: Allergy & Precautions, NPO status , Patient's Chart, lab work & pertinent test results  Airway Mallampati: II  TM Distance: <3 FB Neck ROM: Full    Dental no notable dental hx.    Pulmonary neg pulmonary ROS,    Pulmonary exam normal breath sounds clear to auscultation       Cardiovascular hypertension, + angina + CAD, + Cardiac Stents and + CABG  Normal cardiovascular exam Rhythm:Regular Rate:Normal     Neuro/Psych negative neurological ROS  negative psych ROS   GI/Hepatic negative GI ROS, Neg liver ROS,   Endo/Other  diabetes  Renal/GU negative Renal ROS  negative genitourinary   Musculoskeletal negative musculoskeletal ROS (+)   Abdominal   Peds negative pediatric ROS (+)  Hematology negative hematology ROS (+)   Anesthesia Other Findings   Reproductive/Obstetrics negative OB ROS                            Anesthesia Physical Anesthesia Plan  ASA: 3  Anesthesia Plan: General   Post-op Pain Management:    Induction: Intravenous  PONV Risk Score and Plan: 3 and Ondansetron, Dexamethasone and Treatment may vary due to age or medical condition  Airway Management Planned: LMA  Additional Equipment:   Intra-op Plan:   Post-operative Plan: Extubation in OR  Informed Consent: I have reviewed the patients History and Physical, chart, labs and discussed the procedure including the risks, benefits and alternatives for the proposed anesthesia with the patient or authorized representative who has indicated his/her understanding and acceptance.     Dental advisory given  Plan Discussed with: CRNA and Surgeon  Anesthesia Plan Comments: (PAT note by Karoline Caldwell, PA-C: Follows with cardiology for history of CAD s/p multiple PCI, DES x4 from proximal RCA into PDA in 08/2020 and more  recently PCI/DES of the LAD in 10/2020.  Normal LV function with EF 55% by cath 07/10/2020.  Cardiac clearance per telephone encounter by Lorenso Quarry, PA-C on 03/27/2021, "Chart reviewed as part of pre-operative protocol coverageon 03/22/21 by Roby Lofts, PA-C.Debara Kamphuis Cousinwas last seen on 3/11/22by Dr. [name].Since that day,she reports unchangedintermittent chest pains which are relieved with 1, sometimes 2 SL nitro. She has not noticed any escalation of symptoms in recent weeks/months. She has chronic DOE which is unchanged. She is able to complete 4 METs, though does have intermittent chest pain with these activities (walking, mopping/vacuuming, or changing the sheets on her bed).Per Dr. Harl Bowie, pt sx as above are stable and OK to proceed with surgery as would not recommend delaying the urgent procedure. Per. Dr. [name], the pt should hold Plavix 5 days prior to her surgery with restart per her surgeon's recommendations. She should remain on ASA throughout the perioperative setting."  Last dose Plavix 04/10/2021.  Preop labs reviewed, unremarkable.  DM2 well-controlled with A1c 6.7.  EKG 10/18/20: Sinus bradycardia with marked sinus arrhythmia. Rate 59. Suspect limb reversal LA/LL. T wave abnormality Anteroseptal leads. Since last tracing Suspect limb reversal  PCI 10/18/2020: . Mid LAD lesion is 75% stenosed. . Mid LAD to Dist LAD lesion is 35% stenosed. . A drug-eluting stent was successfully placed using a STENT RESOLUTE ONYX 2.5X34. Marland Kitchen Post intervention, there is a 0% residual stenosis. . Previously placed RPDA drug eluting stent is widely patent. . Previously placed Mid RCA to Dist RCA drug eluting stent is  widely patent. . Previously placed Ost RCA to Mid RCA drug eluting stent is widely patent.  1. Widely patent RCA vessel from prior CTO PCI 2. Successful PCI of the proximal to mid LAD with OCT guidance and DES x 1 with 2.5 x 34 mm Resolute stent post dilated with 2.75 mm  [place] balloon to 18 atm  Plan: will observe overnight tonight. Continue DAPT indefinitely. Anticipate DC in am. )       Anesthesia Quick Evaluation

## 2021-04-12 NOTE — Progress Notes (Signed)
Anesthesia Chart Review:  Follows with cardiology for history of CAD s/p multiple PCI, DES x4 from proximal RCA into PDA in 08/2020 and more recently PCI/DES of the LAD in 10/2020.  Normal LV function with EF 55% by cath 07/10/2020.  Cardiac clearance per telephone encounter by Lorenso Quarry, PA-C on 03/27/2021, "Chart reviewed as part of pre-operative protocol coverage on 03/22/21 by Roby Lofts, PA-C. Kimberly Flowers was last seen on 11/16/20 by Dr. [name].  Since that day, she reports unchanged intermittent chest pains which are relieved with 1, sometimes 2 SL nitro. She has not noticed any escalation of symptoms in recent weeks/months. She has chronic DOE which is unchanged. She is able to complete 4 METs, though does have intermittent chest pain with these activities (walking, mopping/vacuuming, or changing the sheets on her bed). Per Dr. Harl Bowie, pt sx as above are stable and  OK to proceed with surgery as would not recommend delaying the urgent procedure. Per. Dr. [name], the pt should hold Plavix 5 days prior to her surgery with restart per her surgeon's recommendations. She should remain on ASA throughout the perioperative setting."  Last dose Plavix 04/10/2021.  Preop labs reviewed, unremarkable.  DM2 well-controlled with A1c 6.7.  EKG 10/18/20: Sinus bradycardia with marked sinus arrhythmia. Rate 59. Suspect limb reversal LA/LL. T wave abnormality Anteroseptal leads. Since last tracing Suspect limb reversal  PCI 10/18/2020: Mid LAD lesion is 75% stenosed. Mid LAD to Dist LAD lesion is 35% stenosed. A drug-eluting stent was successfully placed using a STENT RESOLUTE ONYX 2.5X34. Post intervention, there is a 0% residual stenosis. Previously placed RPDA drug eluting stent is widely patent. Previously placed Mid RCA to Dist RCA drug eluting stent is widely patent. Previously placed Ost RCA to Mid RCA drug eluting stent is widely patent.   1. Widely patent RCA vessel from prior CTO PCI 2.  Successful PCI of the proximal to mid LAD with OCT guidance and DES x 1 with 2.5 x 34 mm Resolute stent post dilated with 2.75 mm [place] balloon to 18 atm   Plan: will observe overnight tonight. Continue DAPT indefinitely. Anticipate DC in am.   Wynonia Musty [facility] Short Stay Center/Anesthesiology Phone [phone] 04/12/2021 2:07 PM

## 2021-04-16 ENCOUNTER — Other Ambulatory Visit: Payer: Self-pay

## 2021-04-16 ENCOUNTER — Other Ambulatory Visit: Payer: Self-pay | Admitting: General Surgery

## 2021-04-16 ENCOUNTER — Encounter (HOSPITAL_COMMUNITY)
Admission: RE | Disposition: A | Discharge status: Home / Self Care | Payer: Self-pay | Source: Home / Self Care | Attending: General Surgery

## 2021-04-16 ENCOUNTER — Encounter (HOSPITAL_COMMUNITY): Payer: Self-pay | Admitting: General Surgery

## 2021-04-16 ENCOUNTER — Ambulatory Visit (HOSPITAL_COMMUNITY): Payer: Medicare Other | Admitting: Physician Assistant

## 2021-04-16 ENCOUNTER — Inpatient Hospital Stay
Admission: RE | Admit: 2021-04-16 | Discharge: 2021-04-16 | Disposition: A | Discharge status: Home / Self Care | Payer: Medicare Other | Source: Ambulatory Visit | Attending: General Surgery | Admitting: General Surgery

## 2021-04-16 ENCOUNTER — Ambulatory Visit
Admission: RE | Admit: 2021-04-16 | Discharge: 2021-04-16 | Disposition: A | Discharge status: Home / Self Care | Payer: Medicare Other | Source: Ambulatory Visit | Attending: General Surgery | Admitting: General Surgery

## 2021-04-16 ENCOUNTER — Ambulatory Visit (HOSPITAL_COMMUNITY): Payer: Medicare Other

## 2021-04-16 ENCOUNTER — Ambulatory Visit (HOSPITAL_COMMUNITY)
Admission: RE | Admit: 2021-04-16 | Discharge: 2021-04-16 | Disposition: A | Discharge status: Home / Self Care | Payer: Medicare Other | Attending: General Surgery | Admitting: General Surgery

## 2021-04-16 DIAGNOSIS — Z79899 Other long term (current) drug therapy: Secondary | ICD-10-CM | POA: Diagnosis not present

## 2021-04-16 DIAGNOSIS — Z955 Presence of coronary angioplasty implant and graft: Secondary | ICD-10-CM | POA: Insufficient documentation

## 2021-04-16 DIAGNOSIS — Z7984 Long term (current) use of oral hypoglycemic drugs: Secondary | ICD-10-CM | POA: Diagnosis not present

## 2021-04-16 DIAGNOSIS — Z170 Estrogen receptor positive status [ER+]: Secondary | ICD-10-CM | POA: Diagnosis not present

## 2021-04-16 DIAGNOSIS — E785 Hyperlipidemia, unspecified: Secondary | ICD-10-CM | POA: Diagnosis not present

## 2021-04-16 DIAGNOSIS — C50311 Malignant neoplasm of lower-inner quadrant of right female breast: Secondary | ICD-10-CM

## 2021-04-16 DIAGNOSIS — Z7902 Long term (current) use of antithrombotics/antiplatelets: Secondary | ICD-10-CM | POA: Insufficient documentation

## 2021-04-16 DIAGNOSIS — Z803 Family history of malignant neoplasm of breast: Secondary | ICD-10-CM | POA: Insufficient documentation

## 2021-04-16 DIAGNOSIS — Z8249 Family history of ischemic heart disease and other diseases of the circulatory system: Secondary | ICD-10-CM | POA: Insufficient documentation

## 2021-04-16 DIAGNOSIS — I10 Essential (primary) hypertension: Secondary | ICD-10-CM | POA: Insufficient documentation

## 2021-04-16 HISTORY — PX: BREAST LUMPECTOMY WITH RADIOACTIVE SEED LOCALIZATION: SHX6424

## 2021-04-16 LAB — GLUCOSE, CAPILLARY
Glucose-Capillary: 108 mg/dL — ABNORMAL HIGH (ref 70–99)
Glucose-Capillary: 127 mg/dL — ABNORMAL HIGH (ref 70–99)

## 2021-04-16 SURGERY — BREAST LUMPECTOMY WITH RADIOACTIVE SEED LOCALIZATION
Anesthesia: General | Site: Breast | Laterality: Right

## 2021-04-16 MED ORDER — LIDOCAINE 2% (20 MG/ML) 5 ML SYRINGE
INTRAMUSCULAR | Status: AC
  Filled 2021-04-16: qty 5

## 2021-04-16 MED ORDER — ACETAMINOPHEN 500 MG PO TABS
ORAL_TABLET | ORAL | Status: AC
  Administered 2021-04-16: 1000 mg via ORAL
  Filled 2021-04-16: qty 2

## 2021-04-16 MED ORDER — EPHEDRINE 5 MG/ML INJ
INTRAVENOUS | Status: AC
  Filled 2021-04-16: qty 5

## 2021-04-16 MED ORDER — CEFAZOLIN SODIUM-DEXTROSE 2-4 GM/100ML-% IV SOLN
INTRAVENOUS | Status: AC
  Filled 2021-04-16: qty 100

## 2021-04-16 MED ORDER — ONDANSETRON HCL 4 MG/2ML IJ SOLN
INTRAMUSCULAR | Status: AC
  Filled 2021-04-16: qty 2

## 2021-04-16 MED ORDER — LIDOCAINE HCL 1 % IJ SOLN
INTRAMUSCULAR | Status: DC | PRN
  Administered 2021-04-16: 30 mL via INTRAMUSCULAR

## 2021-04-16 MED ORDER — EPHEDRINE SULFATE-NACL 50-0.9 MG/10ML-% IV SOSY
PREFILLED_SYRINGE | INTRAVENOUS | Status: DC | PRN
  Administered 2021-04-16 (×5): 5 mg via INTRAVENOUS

## 2021-04-16 MED ORDER — DEXAMETHASONE SODIUM PHOSPHATE 10 MG/ML IJ SOLN
INTRAMUSCULAR | Status: AC
  Filled 2021-04-16: qty 1

## 2021-04-16 MED ORDER — PROPOFOL 10 MG/ML IV BOLUS
INTRAVENOUS | Status: DC | PRN
  Administered 2021-04-16: 40 mg via INTRAVENOUS
  Administered 2021-04-16: 150 mg via INTRAVENOUS

## 2021-04-16 MED ORDER — LACTATED RINGERS IV SOLN: INTRAVENOUS | Status: DC

## 2021-04-16 MED ORDER — ACETAMINOPHEN 10 MG/ML IV SOLN: 1000.0000 mg | Freq: Once | INTRAVENOUS | Status: DC | PRN

## 2021-04-16 MED ORDER — LIDOCAINE 2% (20 MG/ML) 5 ML SYRINGE
INTRAMUSCULAR | Status: DC | PRN
  Administered 2021-04-16: 100 mg via INTRAVENOUS

## 2021-04-16 MED ORDER — DEXAMETHASONE SODIUM PHOSPHATE 10 MG/ML IJ SOLN
INTRAMUSCULAR | Status: DC | PRN
Start: 2021-04-16 — End: 2021-04-16
  Administered 2021-04-16: 10 mg via INTRAVENOUS

## 2021-04-16 MED ORDER — ONDANSETRON HCL 4 MG/2ML IJ SOLN
INTRAMUSCULAR | Status: DC | PRN
  Administered 2021-04-16: 4 mg via INTRAVENOUS

## 2021-04-16 MED ORDER — CHLORHEXIDINE GLUCONATE CLOTH 2 % EX PADS: 6.0000 | MEDICATED_PAD | Freq: Once | CUTANEOUS | Status: DC

## 2021-04-16 MED ORDER — LIDOCAINE HCL 1 % IJ SOLN
INTRAMUSCULAR | Status: AC
  Filled 2021-04-16: qty 20

## 2021-04-16 MED ORDER — ORAL CARE MOUTH RINSE: 15.0000 mL | Freq: Once | OROMUCOSAL | Status: AC

## 2021-04-16 MED ORDER — CHLORHEXIDINE GLUCONATE 0.12 % MT SOLN: 15.0000 mL | Freq: Once | OROMUCOSAL | Status: AC

## 2021-04-16 MED ORDER — ACETAMINOPHEN 500 MG PO TABS: 1000.0000 mg | ORAL_TABLET | ORAL | Status: AC

## 2021-04-16 MED ORDER — GLYCOPYRROLATE PF 0.2 MG/ML IJ SOSY
PREFILLED_SYRINGE | INTRAMUSCULAR | Status: AC
  Filled 2021-04-16: qty 1

## 2021-04-16 MED ORDER — BUPIVACAINE-EPINEPHRINE (PF) 0.25% -1:200000 IJ SOLN
INTRAMUSCULAR | Status: AC
  Filled 2021-04-16: qty 30

## 2021-04-16 MED ORDER — FENTANYL CITRATE (PF) 250 MCG/5ML IJ SOLN
INTRAMUSCULAR | Status: AC
  Filled 2021-04-16: qty 5

## 2021-04-16 MED ORDER — GLYCOPYRROLATE PF 0.2 MG/ML IJ SOSY
PREFILLED_SYRINGE | INTRAMUSCULAR | Status: DC | PRN
  Administered 2021-04-16: .2 mg via INTRAVENOUS

## 2021-04-16 MED ORDER — CHLORHEXIDINE GLUCONATE 0.12 % MT SOLN
OROMUCOSAL | Status: AC
  Administered 2021-04-16: 15 mL via OROMUCOSAL
  Filled 2021-04-16: qty 15

## 2021-04-16 MED ORDER — FENTANYL CITRATE (PF) 250 MCG/5ML IJ SOLN
INTRAMUSCULAR | Status: DC | PRN
  Administered 2021-04-16 (×2): 50 ug via INTRAVENOUS
  Administered 2021-04-16: 100 ug via INTRAVENOUS

## 2021-04-16 MED ORDER — ONDANSETRON HCL 4 MG/2ML IJ SOLN
4.0000 mg | Freq: Once | INTRAMUSCULAR | Status: AC | PRN
  Administered 2021-04-16: 4 mg via INTRAVENOUS

## 2021-04-16 MED ORDER — CEFAZOLIN SODIUM-DEXTROSE 2-4 GM/100ML-% IV SOLN
2.0000 g | INTRAVENOUS | Status: AC
  Administered 2021-04-16: 2 g via INTRAVENOUS

## 2021-04-16 MED ORDER — PROPOFOL 10 MG/ML IV BOLUS
INTRAVENOUS | Status: AC
  Filled 2021-04-16: qty 20

## 2021-04-16 MED ORDER — ENSURE PRE-SURGERY PO LIQD: 296.0000 mL | Freq: Once | ORAL | Status: DC

## 2021-04-16 MED ORDER — FENTANYL CITRATE (PF) 100 MCG/2ML IJ SOLN: 25.0000 ug | INTRAMUSCULAR | Status: DC | PRN

## 2021-04-16 MED ORDER — OXYCODONE HCL 5 MG PO TABS: 5.0000 mg | ORAL_TABLET | Freq: Four times a day (QID) | ORAL | 0 refills | Status: DC | PRN

## 2021-04-16 MED ORDER — 0.9 % SODIUM CHLORIDE (POUR BTL) OPTIME
TOPICAL | Status: DC | PRN
  Administered 2021-04-16: 1000 mL

## 2021-04-16 SURGICAL SUPPLY — 40 items
BAG COUNTER SPONGE SURGICOUNT (BAG) ×2 IMPLANT
BINDER BREAST LRG (GAUZE/BANDAGES/DRESSINGS) IMPLANT
BINDER BREAST XLRG (GAUZE/BANDAGES/DRESSINGS) ×2 IMPLANT
BLADE SURG 10 STRL SS (BLADE) ×2 IMPLANT
CANISTER SUCT 3000ML PPV (MISCELLANEOUS) IMPLANT
CHLORAPREP W/TINT 26 (MISCELLANEOUS) ×2 IMPLANT
CLIP VESOCCLUDE LG 6/CT (CLIP) ×2 IMPLANT
COVER PROBE W GEL 5X96 (DRAPES) ×2 IMPLANT
COVER SURGICAL LIGHT HANDLE (MISCELLANEOUS) ×2 IMPLANT
DERMABOND ADVANCED (GAUZE/BANDAGES/DRESSINGS) ×1
DERMABOND ADVANCED .7 DNX12 (GAUZE/BANDAGES/DRESSINGS) ×1 IMPLANT
DEVICE DUBIN SPECIMEN MAMMOGRA (MISCELLANEOUS) ×2 IMPLANT
DRAPE CHEST BREAST 15X10 FENES (DRAPES) ×2 IMPLANT
DRSG PAD ABDOMINAL 8X10 ST (GAUZE/BANDAGES/DRESSINGS) ×2 IMPLANT
ELECT COATED BLADE 2.86 ST (ELECTRODE) ×2 IMPLANT
ELECT REM PT RETURN 9FT ADLT (ELECTROSURGICAL) ×2
ELECTRODE REM PT RTRN 9FT ADLT (ELECTROSURGICAL) ×1 IMPLANT
GAUZE SPONGE 4X4 12PLY STRL (GAUZE/BANDAGES/DRESSINGS) ×2 IMPLANT
GAUZE SPONGE 4X4 12PLY STRL LF (GAUZE/BANDAGES/DRESSINGS) ×2 IMPLANT
GLOVE SURG ENC MOIS LTX SZ6 (GLOVE) ×2 IMPLANT
GLOVE SURG UNDER LTX SZ6.5 (GLOVE) ×2 IMPLANT
GOWN STRL REUS W/ TWL LRG LVL3 (GOWN DISPOSABLE) ×1 IMPLANT
GOWN STRL REUS W/TWL 2XL LVL3 (GOWN DISPOSABLE) ×2 IMPLANT
GOWN STRL REUS W/TWL LRG LVL3 (GOWN DISPOSABLE) ×1
KIT BASIN OR (CUSTOM PROCEDURE TRAY) ×2 IMPLANT
KIT MARKER MARGIN INK (KITS) ×2 IMPLANT
LIGHT WAVEGUIDE WIDE FLAT (MISCELLANEOUS) IMPLANT
NEEDLE HYPO 25GX1X1/2 BEV (NEEDLE) ×2 IMPLANT
NS IRRIG 1000ML POUR BTL (IV SOLUTION) IMPLANT
PACK GENERAL/GYN (CUSTOM PROCEDURE TRAY) ×2 IMPLANT
STRIP CLOSURE SKIN 1/2X4 (GAUZE/BANDAGES/DRESSINGS) ×2 IMPLANT
SUT MNCRL AB 4-0 PS2 18 (SUTURE) ×2 IMPLANT
SUT SILK 2 0 SH (SUTURE) IMPLANT
SUT VIC AB 2-0 SH 27 (SUTURE) ×1
SUT VIC AB 2-0 SH 27XBRD (SUTURE) ×1 IMPLANT
SUT VIC AB 3-0 SH 27 (SUTURE) ×1
SUT VIC AB 3-0 SH 27X BRD (SUTURE) ×1 IMPLANT
SYR CONTROL 10ML LL (SYRINGE) ×2 IMPLANT
TOWEL GREEN STERILE (TOWEL DISPOSABLE) ×2 IMPLANT
TOWEL GREEN STERILE FF (TOWEL DISPOSABLE) ×2 IMPLANT

## 2021-04-16 NOTE — Transfer of Care (Signed)
Immediate Anesthesia Transfer of Care Note  Patient: Kimberly Flowers  Procedure(s) Performed: RIGHT BREAST LUMPECTOMY WITH RADIOACTIVE SEED LOCALIZATION (Right: Breast)  Patient Location: PACU  Anesthesia Type:General  Level of Consciousness: awake, alert  and oriented  Airway & Oxygen Therapy: Patient Spontanous Breathing  Post-op Assessment: Report given to RN and Post -op Vital signs reviewed and stable  Post vital signs: Reviewed and stable  Last Vitals:  Vitals Value Taken Time  BP 146/73 04/16/21 1624  Temp    Pulse 68 04/16/21 1625  Resp 14 04/16/21 1625  SpO2 93 % 04/16/21 1625  Vitals shown include unvalidated device data.  Last Pain:  Vitals:   04/16/21 1441  TempSrc:   PainSc: 0-No pain         Complications: No notable events documented.

## 2021-04-16 NOTE — H&P (Signed)
REFERRING PHYSICIAN:  Everlean Alstrom, MD   PROVIDER:  Georgianne Fick, MD   Care Team: Patient Care Team: Unknown as PCP - General    MRN: [id number] DOB: 1941/02/23 DATE OF ENCOUNTER: 03/21/2021   Subjective    Chief Complaint: Breast Cancer       History of Present Illness: Kimberly Flowers is a 80 y.o. female who is seen today as an office consultation at the request of Dr. Shelly Bombard for evaluation of Breast Cancer     Pt is a 80 yo F who was diagnosed with right breast cancer 02/2021.  She was found to have a possible mass on screening mammogram.  Diagnostic imaging confirmed this, showing a 7 mm mass at 5 o'clock.  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     Pt hasn't had breast biopsies before, but has had many callbacks from screening.  She has family history of breast cancer in her sister who she thinks was in her 35s.       Diagnostic mammogram:   [facility] [place] 02/13/21 Suspicious 0.7 cm mass in the right breast at the 5 o'clock  position.  RECOMMENDATION:  Recommend ultrasound-guided biopsy of the mass in the right breast  at the 5 o'clock position.  I have discussed the findings and recommendations with the patient.  If applicable, a reminder letter will be sent to the patient  regarding the next appointment.  BI-RADS CATEGORY  4: Suspicious.  Electronically Signed    By: Everlean Alstrom M.D.    On: 02/13/2021 15:24   Narrative   CLINICAL DATA:  Screening recall for possible right breast mass.  EXAM:  DIGITAL DIAGNOSTIC UNILATERAL RIGHT MAMMOGRAM WITH TOMOSYNTHESIS AND  CAD; ULTRASOUND RIGHT BREAST LIMITED  TECHNIQUE:  Right digital diagnostic mammography and breast tomosynthesis was  performed. The images were evaluated with computer-aided detection.;  Targeted ultrasound examination of the right breast was performed  COMPARISON:  Previous exams.  ACR Breast Density Category c:  The breast tissue is heterogeneously  dense, which may obscure small masses.  FINDINGS:  Spot compression tomograms were performed of the right breast. There  is an oval mass with slight margin irregularity in the lower central  right breast measuring 0.5 cm.  Targeted ultrasound of the lower right breast was performed. There  is an oval hypoechoic mass in the right breast at 5 o'clock 3 cm  from nipple with slight margin irregularity measuring 0.6 x 0.4 x  0.7 cm. This is felt to correspond well with the mass seen in the  lower right breast at mammography. No lymphadenopathy seen in the  right axilla.     Pathology core needle biopsy: [facility] labarotories [id number] Invasive ductal carcinoma, grade I ER/PR + strong intensity Her 2 negative Ki 67 5%   Review of Systems: A complete review of systems was obtained from the patient.  I have reviewed this information and discussed as appropriate with the patient.  See HPI as well for other ROS.   Review of Systems  Cardiovascular: Positive for leg swelling.  All other systems reviewed and are negative.       Medical History: Past Medical History      Past Medical History:  Diagnosis Date   History of cancer     Hyperlipidemia     Hypertension             Patient Active Problem List  Diagnosis   Malignant neoplasm of lower-inner quadrant of right breast of female, estrogen receptor positive (CMS-HCC)      Past Surgical History       Past Surgical History:  Procedure Laterality Date   coronary stent intervention       left heart cath and coronary angiography            Allergies  No Known Allergies           Current Outpatient Medications on File Prior to Visit  Medication Sig Dispense Refill   amLODIPine (NORVASC) 10 MG tablet         atorvastatin (LIPITOR) 40 MG tablet         clopidogreL (PLAVIX) 75 mg tablet         isosorbide mononitrate (IMDUR) 60 MG ER tablet         losartan (COZAAR) 50 MG tablet          metFORMIN (GLUCOPHAGE) 500 MG tablet Take 500 mg by mouth once daily       nebivoloL (BYSTOLIC) 10 MG tablet Take 10 mg by mouth once daily       ONETOUCH VERIO TEST STRIPS test strip 1 (ONE) STRIP DAILY AS DIRECTED DX E 11.9 LIFETIME       pantoprazole (PROTONIX) 40 MG DR tablet          No current facility-administered medications on file prior to visit.      Family History       Family History  Problem Relation Age of Onset   Diabetes Mother     Coronary Artery Disease (Blocked arteries around heart) Father     Breast cancer Sister          Social History       Tobacco Use  Smoking Status Never Smoker  Smokeless Tobacco Never Used      Social History  Social History        Socioeconomic History   Marital status: Married  Tobacco Use   Smoking status: Never Smoker   Smokeless tobacco: Never Used  Substance and Sexual Activity   Alcohol use: Not Currently   Drug use: Never        Objective:         Vitals:    03/21/21 1520  BP: (!) 144/84  Pulse: 70  Temp: 36.4 C (97.6 F)  SpO2: 97%  Weight: 83.3 kg (183 lb 9.6 oz)  Height: 166.4 cm (5' 5.5")    Body mass index is 30.09 kg/m.       Gen:  No acute distress.  Well nourished and well groomed.   Neurological: Alert and oriented to person, place, and time. Coordination normal.  Head: Normocephalic and atraumatic.  Eyes: Conjunctivae are normal. Pupils are equal, round, and reactive to light. No scleral icterus.  Neck: Normal range of motion. Neck supple. No tracheal deviation or thyromegaly present.  Cardiovascular: Normal rate, regular rhythm, normal heart sounds and intact distal pulses.  Exam reveals no gallop and no friction rub.  No murmur heard. Sternotomy scar well healed.   Breast: right slightly smaller than left.  Ptosis bilaterally.  No palpable masses, but prominent inframammary fold bilaterally. No LAD.  No nipple retraction or nipple discharge.   Respiratory: Effort normal.  No  respiratory distress. No chest wall tenderness. Breath sounds normal.  No wheezes, rales or rhonchi.  GI: Soft. Bowel sounds are normal. The abdomen is soft and nontender.  There is no rebound  and no guarding.  Musculoskeletal: Normal range of motion. Extremities are nontender.  Lymphadenopathy: No cervical, preauricular, postauricular or axillary adenopathy is present Skin: Skin is warm and dry. No rash noted. No diaphoresis. No erythema. No pallor. No clubbing, cyanosis, or edema.   Psychiatric: Normal mood and affect. Behavior is normal. Judgment and thought content normal.          Assessment and Plan:    Malignant neoplasm of lower-inner quadrant of right breast of female, estrogen receptor positive (CMS-HCC) Pt has a new diagnosis of cT1bN0 right breast cancer.  As this is grade 1 and ER positive, we will plan seed localized lumpectomy only.  This would be followed by antiestrogen treatment and possibly radiation.     The surgical procedure was described to the patient.  I discussed the incision type and location and that we would need radiology involved on with a wire or seed marker and/or sentinel node.       The risks and benefits of the procedure were described to the patient and she wishes to proceed.     We discussed the risks bleeding, infection, damage to other structures, need for further procedures/surgeries.  We discussed the risk of seroma.  The patient was advised if the area in the breast in cancer, we may need to go back to surgery for additional tissue to obtain negative margins or for a lymph node biopsy. The patient was advised that these are the most common complications, but that others can occur as well.  They were advised against taking aspirin or other anti-inflammatory agents/blood thinners the week before surgery.           Return for breast cancer follow up.     Milus Height, MD FACS Surgical Oncology, General Surgery, Trauma and [facility]

## 2021-04-16 NOTE — Op Note (Signed)
Right Breast Radioactive seed localized lumpectomy  Indications: This patient presents with history of right breast cancer; cT1bN0M0 grade 1 invasive ductal carcinoma, lower outer quadrant, +/+/- receptors  Pre-operative Diagnosis: right breast cancer  Post-operative Diagnosis: right breast cancer  Surgeon: Stark Klein   Anesthesia: General endotracheal anesthesia  ASA Class: 3  Procedure Details  The patient was seen in the Holding Room. The risks, benefits, complications, treatment options, and expected outcomes were discussed with the patient. The possibilities of bleeding, infection, the need for additional procedures, failure to diagnose a condition, and creating a complication requiring other procedures or operations were discussed with the patient. The patient concurred with the proposed plan, giving informed consent.  The site of surgery properly noted/marked. The patient was taken to Operating Room # 2, identified, and the procedure verified as right breast seed localized lumpectomy.  The right breast and chest were prepped and draped in standard fashion. A transverse incision was made near the previously placed radioactive seed.  Dissection was carried down around the point of maximum signal intensity. The cautery was used to perform the dissection.   The specimen was inked with the margin marker paint kit.    Specimen radiography confirmed inclusion of the mammographic lesion, the clip, and the seed.  The clip and seed were a bit close to the lateral margin, so an additional lateral margin was taken.  The background signal in the breast was zero.  Hemostasis was achieved with cautery.  The cavity was marked with clips on each border other than the anterior border.  The wound was irrigated and closed with 3-0 vicryl interrupted deep dermal sutures and 4-0 monocryl running subcuticular suture.      Sterile dressings were applied. At the end of the operation, all sponge, instrument, and  needle counts were correct.   Findings: Seed, clip in specimen. Anterior and inferior margins are skin, posterior margin is pectoralis   Estimated Blood Loss:  min         Specimens: left breast tissue with seed, additional lateral margin         Complications:  None; patient tolerated the procedure well.         Disposition: PACU - hemodynamically stable.         Condition: stable

## 2021-04-16 NOTE — Interval H&P Note (Signed)
History and Physical Interval Note:  04/16/2021 2:46 PM  NKAUJ MASKER  has presented today for surgery, with the diagnosis of RIGHT BREAST CANCER.  The various methods of treatment have been discussed with the patient and family. After consideration of risks, benefits and other options for treatment, the patient has consented to  Procedure(s): RIGHT BREAST LUMPECTOMY WITH RADIOACTIVE SEED LOCALIZATION (Right) as a surgical intervention.  The patient's history has been reviewed, patient examined, no change in status, stable for surgery.  I have reviewed the patient's chart and labs.  Questions were answered to the patient's satisfaction.     Stark Klein

## 2021-04-16 NOTE — Anesthesia Procedure Notes (Signed)
Procedure Name: LMA Insertion Date/Time: 04/16/2021 3:19 PM Performed by: Dorthea Cove, CRNA Pre-anesthesia Checklist: Patient identified, Emergency Drugs available, Suction available and Patient being monitored Patient Re-evaluated:Patient Re-evaluated prior to induction Oxygen Delivery Method: Circle System Utilized Preoxygenation: Pre-oxygenation with 100% oxygen Induction Type: IV induction Ventilation: Mask ventilation without difficulty LMA: LMA inserted LMA Size: 4.0 Number of attempts: 1 Airway Equipment and Method: Bite block Placement Confirmation: positive ETCO2 Tube secured with: Tape Dental Injury: Teeth and Oropharynx as per pre-operative assessment

## 2021-04-16 NOTE — Discharge Instructions (Addendum)
[facility] Office Phone Number [phone]  BREAST BIOPSY/ PARTIAL MASTECTOMY: POST OP INSTRUCTIONS  Always review your discharge instruction sheet given to you by the facility where your surgery was performed.  IF YOU HAVE DISABILITY OR FAMILY LEAVE FORMS, YOU MUST BRING THEM TO THE OFFICE FOR PROCESSING.  DO NOT GIVE THEM TO YOUR DOCTOR.  A prescription for pain medication may be given to you upon discharge.  Take your pain medication as prescribed, if needed.  If narcotic pain medicine is not needed, then you may take acetaminophen (Tylenol) or ibuprofen (Advil) as needed. Take your usually prescribed medications unless otherwise directed If you need a refill on your pain medication, please contact your pharmacy.  They will contact our office to request authorization.  Prescriptions will not be filled after 5pm or on week-ends. You should eat very light the first 24 hours after surgery, such as soup, crackers, pudding, etc.  Resume your normal diet the day after surgery. Most patients will experience some swelling and bruising in the breast.  Ice packs and a good support bra will help.  Swelling and bruising can take several days to resolve.  It is common to experience some constipation if taking pain medication after surgery.  Increasing fluid intake and taking a stool softener will usually help or prevent this problem from occurring.  A mild laxative (Milk of Magnesia or Miralax) should be taken according to package directions if there are no bowel movements after 48 hours. Unless discharge instructions indicate otherwise, you may remove your bandages 48 hours after surgery, and you may shower at that time.  You may have steri-strips (small skin tapes) in place directly over the incision.  These strips should be left on the skin for 7-10 days.   Any sutures or staples will be removed at the office during your follow-up visit. ACTIVITIES:  You may resume regular daily activities  (gradually increasing) beginning the next day.  Wearing a good support bra or sports bra (or the breast binder) minimizes pain and swelling.  You may have sexual intercourse when it is comfortable. You may drive when you no longer are taking prescription pain medication, you can comfortably wear a seatbelt, and you can safely maneuver your car and apply brakes. RETURN TO WORK:  __________1 week_______________ You should see your doctor in the office for a follow-up appointment approximately two weeks after your surgery.  Your doctor's nurse will typically make your follow-up appointment when she calls you with your pathology report.  Expect your pathology report 2-3 business days after your surgery.  You may call to check if you do not hear from us after three days.   WHEN TO CALL YOUR DOCTOR: Fever over 101.0 Nausea and/or vomiting. Extreme swelling or bruising. Continued bleeding from incision. Increased pain, redness, or drainage from the incision.  The clinic staff is available to answer your questions during regular business hours.  Please don't hesitate to call and ask to speak to one of the nurses for clinical concerns.  If you have a medical emergency, go to the nearest emergency room or call 911.  A surgeon from [facility] is always on call at the hospital.  For further questions, please visit [email or web address]   

## 2021-04-17 ENCOUNTER — Encounter (HOSPITAL_COMMUNITY): Payer: Self-pay | Admitting: General Surgery

## 2021-04-17 NOTE — Anesthesia Postprocedure Evaluation (Signed)
Anesthesia Post Note  Patient: Kimberly Flowers  Procedure(s) Performed: RIGHT BREAST LUMPECTOMY WITH RADIOACTIVE SEED LOCALIZATION (Right: Breast)     Patient location during evaluation: PACU Anesthesia Type: General Level of consciousness: sedated Pain management: pain level controlled Vital Signs Assessment: post-procedure vital signs reviewed and stable Respiratory status: spontaneous breathing and respiratory function stable Cardiovascular status: stable Postop Assessment: no apparent nausea or vomiting Anesthetic complications: no   No notable events documented.  Last Vitals:  Vitals:   04/16/21 1639 04/16/21 1654  BP: (!) 149/63 (!) 138/57  Pulse: 62 (!) 55  Resp: 11 19  Temp:  (!) 36.2 C  SpO2: 92% 92%    Last Pain:  Vitals:   04/16/21 1654  TempSrc:   PainSc: 0-No pain                 Chuck Caban DANIEL

## 2021-04-19 LAB — SURGICAL PATHOLOGY

## 2021-06-03 ENCOUNTER — Ambulatory Visit (INDEPENDENT_AMBULATORY_CARE_PROVIDER_SITE_OTHER): Payer: Medicare Other | Admitting: Cardiology

## 2021-06-03 ENCOUNTER — Encounter: Payer: Self-pay | Admitting: Cardiology

## 2021-06-03 VITALS — BP 116/78 | HR 66 | Ht 65.5 in | Wt 180.6 lb

## 2021-06-03 DIAGNOSIS — I2510 Atherosclerotic heart disease of native coronary artery without angina pectoris: Secondary | ICD-10-CM

## 2021-06-03 DIAGNOSIS — I25118 Atherosclerotic heart disease of native coronary artery with other forms of angina pectoris: Secondary | ICD-10-CM | POA: Diagnosis not present

## 2021-06-03 DIAGNOSIS — I10 Essential (primary) hypertension: Secondary | ICD-10-CM | POA: Diagnosis not present

## 2021-06-03 MED ORDER — ISOSORBIDE MONONITRATE ER 60 MG PO TB24: 90.0000 mg | ORAL_TABLET | Freq: Every day | ORAL | 0 refills | Status: DC

## 2021-06-03 MED ORDER — NITROGLYCERIN 0.4 MG/SPRAY TL SOLN
1.0000 | 3 refills | Status: DC | PRN
Start: 2021-06-03 — End: 2022-11-05

## 2021-06-03 NOTE — Progress Notes (Signed)
Clinical Summary Kimberly Flowers is a 80 y.o.female seen today for follow up of the following medical problems.      1. CAD - history of prior CABG, subsequent occlusion of LIMA-LAD graft, had LAD stent  Low risk nuclear stress test on 1/8/201   07/2020 cath: mid LAD 65%, D3 90%, LCX patent, RCA occluded mid RPDA fills by collaterals.  -she had PCI to RCA CTO with Dr [name] 08/15/20. Recs for indefintie DAPT given extensive stents.  - 10/2020 PCI to mid LAD  - some recent chest pains - symptoms never fully resolved after last stent but less frequent -episode of chest pain yesteryday morning while driving. Sharp/pressure midchest, bilateral shoulders, bilatler jaw. +SOB, sweaty.Took NG x 2, 20 min later symptoms improved - mild episodes about once a week.  - compliant with meds - walks 30-60 minutes, no consistent chest pains       2. HTN - compliant with meds    3. Breast cancer    Past Medical History:  Diagnosis Date   Anemia    Coronary atherosclerosis of native coronary artery    Previous PCI 2007-2008, Dr. Sharyon Cable   Essential hypertension, benign    History of kidney stones    Mixed hyperlipidemia    PAD (peripheral artery disease) (HCC)     Absent right DP   Palpitations    Type 2 diabetes mellitus (HCC)    Vasomotor rhinitis      Allergies  Allergen Reactions   Ranolazine Er Other (See Comments)    Dizziness    Sulfonamide Derivatives     Unknown     Current Outpatient Medications  Medication Sig Dispense Refill   acetaminophen (TYLENOL) 500 MG tablet Take 500-1,000 mg by mouth every 6 (six) hours as needed for moderate pain.     amLODipine (NORVASC) 10 MG tablet TAKE 1 TABLET BY MOUTH EVERY DAY (Patient taking differently: Take 10 mg by mouth daily.) 90 tablet 1   Ascorbic Acid (VITAMIN C WITH ROSE HIPS) 500 MG tablet Take 1,000 mg by mouth daily.     aspirin 81 MG EC tablet Take 81 mg by mouth daily.     atorvastatin (LIPITOR) 80 MG tablet Take 1  tablet (80 mg total) by mouth daily. 90 tablet 3   Calcium Carb-Cholecalciferol (CALCIUM PLUS VITAMIN D3) 600-500 MG-UNIT CAPS Take 1 tablet by mouth daily.     cholecalciferol (VITAMIN D3) 25 MCG (1000 UNIT) tablet Take 1,000 Units by mouth daily. In the morning     clopidogrel (PLAVIX) 75 MG tablet Take 1 tablet (75 mg total) by mouth daily. 90 tablet 3   docusate sodium (COLACE) 100 MG capsule Take 100 mg by mouth daily as needed for mild constipation or moderate constipation.      eye wash (,SODIUM/POTASSIUM/SOD CHLORIDE,) SOLN Place 1 drop into both eyes 3 (three) times a week.     ferrous gluconate (FERGON) 324 MG tablet Take 324 mg by mouth at bedtime.      fexofenadine (ALLEGRA) 180 MG tablet Take 180 mg by mouth daily.     Glucosamine-Chondroitin-MSM-D3 TABS Take 1 tablet by mouth daily.     HYDROcodone-acetaminophen (NORCO/VICODIN) 5-325 MG tablet Take 1 tablet by mouth every 4 (four) hours as needed. (Patient not taking: No sig reported) 10 tablet 0   isosorbide mononitrate (IMDUR) 60 MG 24 hr tablet TAKE 1 TABLET BY MOUTH EVERY DAY (Patient taking differently: Take 60 mg by mouth daily.) 90 tablet 1  losartan (COZAAR) 50 MG tablet Take 50 mg by mouth daily.     metFORMIN (GLUCOPHAGE) 500 MG tablet Take 500 mg by mouth every evening.      Multiple Vitamin (MULTIVITAMIN WITH MINERALS) TABS tablet Take 1 tablet by mouth every evening.     Multiple Vitamins-Minerals (PRESERVISION AREDS 2 PO) Take 1 tablet by mouth in the morning and at bedtime.     nebivolol (BYSTOLIC) 10 MG tablet Take 1 tablet (10 mg total) by mouth daily. 30 tablet 6   nitroGLYCERIN (NITROLINGUAL) 0.4 MG/SPRAY spray Place 1 spray under the tongue every 5 (five) minutes x 3 doses as needed for chest pain (if no relief after 3rd dose, proceed to the ED for an evaluation). 4.9 g 0   Omega-3 Fatty Acids (FISH OIL PO) Take 2,000 mg by mouth daily.     ondansetron (ZOFRAN ODT) 4 MG disintegrating tablet Take 1 tablet (4 mg  total) by mouth every 8 (eight) hours as needed for nausea or vomiting. 10 tablet 0   oxyCODONE (OXY IR/ROXICODONE) 5 MG immediate release tablet Take 1 tablet (5 mg total) by mouth every 6 (six) hours as needed for severe pain. 5 tablet 0   pantoprazole (PROTONIX) 40 MG tablet Take 1 tablet (40 mg total) by mouth daily. 30 tablet 11   tamsulosin (FLOMAX) 0.4 MG CAPS capsule Take 1 capsule (0.4 mg total) by mouth daily. (Patient not taking: Reported on 04/10/2021) 14 capsule 0   No current facility-administered medications for this visit.     Past Surgical History:  Procedure Laterality Date   ABDOMINAL HYSTERECTOMY     APPENDECTOMY     BREAST LUMPECTOMY WITH RADIOACTIVE SEED LOCALIZATION Right 04/16/2021   Procedure: RIGHT BREAST LUMPECTOMY WITH RADIOACTIVE SEED LOCALIZATION;  Surgeon: Stark Klein, MD;  Location: [facility];  Service: General;  Laterality: Right;   BREAST SURGERY     CARDIAC CATHETERIZATION N/A 08/17/2015   Procedure: Left Heart Cath and Coronary Angiography;  Surgeon: Burnell Blanks, MD;  Location: [facility];  Service: Cardiovascular;  Laterality: N/A;   CHOLECYSTECTOMY     CORONARY ARTERY BYPASS GRAFT  2006   [name], single vessel   CORONARY CTO INTERVENTION N/A 08/15/2020   Procedure: CORONARY CTO INTERVENTION;  Surgeon: [name], [name], MD;  Location: [facility];  Service: Cardiovascular;  Laterality: N/A;   CORONARY STENT INTERVENTION N/A 08/15/2020   Procedure: CORONARY STENT INTERVENTION;  Surgeon: [name], [name], MD;  Location: [facility];  Service: Cardiovascular;  Laterality: N/A;   CORONARY STENT INTERVENTION N/A 10/18/2020   Procedure: CORONARY STENT INTERVENTION;  Surgeon: [name], [name], MD;  Location: [facility];  Service: Cardiovascular;  Laterality: N/A;   CORONARY STENT INTERVENTION  10/18/2020   INTRAVASCULAR ULTRASOUND/IVUS N/A 08/15/2020   Procedure: Intravascular Ultrasound/IVUS;  Surgeon: [name], [name], MD;   Location: [facility];  Service: Cardiovascular;  Laterality: N/A;   INTRAVASCULAR ULTRASOUND/IVUS N/A 10/18/2020   Procedure: Intravascular Ultrasound/IVUS;  Surgeon: [name], [name], MD;  Location: [facility];  Service: Cardiovascular;  Laterality: N/A;   LEFT HEART CATH AND CORONARY ANGIOGRAPHY N/A 07/10/2020   Procedure: LEFT HEART CATH AND CORONARY ANGIOGRAPHY;  Surgeon: [name], MD;  Location: [facility];  Service: Cardiovascular;  Laterality: N/A;     Allergies  Allergen Reactions   Ranolazine Er Other (See Comments)    Dizziness    Sulfonamide Derivatives     Unknown  Family History  Problem Relation Age of Onset   Cancer Sister        Breast   CAD Other        Family history     Social History Kimberly Flowers reports that she has never smoked. She has never used smokeless tobacco. Kimberly Flowers reports no history of alcohol use.   Review of Systems CONSTITUTIONAL: No weight loss, fever, chills, weakness or fatigue.  HEENT: Eyes: No visual loss, blurred vision, double vision or yellow sclerae.No hearing loss, sneezing, congestion, runny nose or sore throat.  SKIN: No rash or itching.  CARDIOVASCULAR: per hpi RESPIRATORY: per hpi GASTROINTESTINAL: No anorexia, nausea, vomiting or diarrhea. No abdominal pain or blood.  GENITOURINARY: No burning on urination, no polyuria NEUROLOGICAL: No headache, dizziness, syncope, paralysis, ataxia, numbness or tingling in the extremities. No change in bowel or bladder control.  MUSCULOSKELETAL: No muscle, back pain, joint pain or stiffness.  LYMPHATICS: No enlarged nodes. No history of splenectomy.  PSYCHIATRIC: No history of depression or anxiety.  ENDOCRINOLOGIC: No reports of sweating, cold or heat intolerance. No polyuria or polydipsia.  Marland Kitchen   Physical Examination Today's Vitals   06/03/21 0858  BP: 116/78  Pulse: 66  SpO2: 98%  Height: 5' 5.5" (1.664 m)   Body mass index is 29.33  kg/m.  Gen: resting comfortably, no acute distress HEENT: no scleral icterus, pupils equal round and reactive, no palptable cervical adenopathy,  CV: RRR, no m/r/g no jvd Resp: Clear to auscultation bilaterally GI: abdomen is soft, non-tender, non-distended, normal bowel sounds, no hepatosplenomegaly MSK: extremities are warm, no edema.  Skin: warm, no rash Neuro:  no focal deficits Psych: appropriate affect   Diagnostic Studies  She underwent coronary angiography on 08/17/2015 which demonstrated the following:   1. Single vessel CAD with patent stent mid LAD with mild restenosis. 2. Severe disease in a small caliber diagonal Parke Jandreau. The ostium of this Jerick Khachatryan is jailed by the LAD stent. The vessel is 1.5 mm and too small for PCI.   3. Mild non-obstructive disease in the RCA and Circumflex artery.   4. Normal LV systolic function 5. 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   Nuclear stress test 09/15/17 showed no significant myocardial ischemia or scar.  Blood pressure demonstrated a hypertensive response to exercise.  [facility] treadmill score was low, LVEF 58%.   Assessment and Plan  1. CAD/Chest pain - after her PCI procedurs reports chest pain symptoms were less frequent but never fully went away - some recent increase in the intensity of symptoms. Her symptoms are not to the degree I would plan repeat ischemic testing, but would work to further optimize antianginal therapy - increase her imdur to 90mg  daily and monitor symptoms  2. HTN -at goal, continue current meds   F/u 3 months      Arnoldo Lenis, M.D.

## 2021-06-03 NOTE — Patient Instructions (Addendum)
Medication Instructions:  Increase Imdur to 90mg  daily. Continue all other medications.     Labwork: none  Testing/Procedures: none  Follow-Up: 3 months   Any Other Special Instructions Will Be Listed Below (If Applicable).   If you need a refill on your cardiac medications before your next appointment, please call your pharmacy.

## 2021-06-19 ENCOUNTER — Emergency Department (HOSPITAL_COMMUNITY): Payer: Medicare Other

## 2021-06-19 ENCOUNTER — Ambulatory Visit (HOSPITAL_COMMUNITY)
Admission: RE | Admit: 2021-06-19 | Discharge: 2021-06-19 | Disposition: A | Discharge status: Home / Self Care | Payer: Medicare Other | Source: Ambulatory Visit | Attending: Urology | Admitting: Urology

## 2021-06-19 ENCOUNTER — Other Ambulatory Visit: Payer: Self-pay

## 2021-06-19 ENCOUNTER — Encounter: Payer: Self-pay | Admitting: Urology

## 2021-06-19 ENCOUNTER — Encounter (HOSPITAL_COMMUNITY): Payer: Self-pay

## 2021-06-19 ENCOUNTER — Inpatient Hospital Stay (HOSPITAL_COMMUNITY)
Admission: EM | Admit: 2021-06-19 | Discharge: 2021-06-22 | DRG: 853 | Disposition: A | Discharge status: Home / Self Care | Payer: Medicare Other | Attending: Internal Medicine | Admitting: Internal Medicine

## 2021-06-19 ENCOUNTER — Ambulatory Visit (INDEPENDENT_AMBULATORY_CARE_PROVIDER_SITE_OTHER): Payer: Medicare Other | Admitting: Urology

## 2021-06-19 VITALS — BP 77/51 | HR 76 | Temp 98.0°F | Wt 180.0 lb

## 2021-06-19 DIAGNOSIS — N202 Calculus of kidney with calculus of ureter: Secondary | ICD-10-CM | POA: Diagnosis present

## 2021-06-19 DIAGNOSIS — I959 Hypotension, unspecified: Secondary | ICD-10-CM | POA: Diagnosis not present

## 2021-06-19 DIAGNOSIS — I110 Hypertensive heart disease with heart failure: Secondary | ICD-10-CM | POA: Diagnosis present

## 2021-06-19 DIAGNOSIS — E1151 Type 2 diabetes mellitus with diabetic peripheral angiopathy without gangrene: Secondary | ICD-10-CM | POA: Diagnosis present

## 2021-06-19 DIAGNOSIS — E782 Mixed hyperlipidemia: Secondary | ICD-10-CM | POA: Diagnosis present

## 2021-06-19 DIAGNOSIS — N136 Pyonephrosis: Secondary | ICD-10-CM | POA: Diagnosis present

## 2021-06-19 DIAGNOSIS — I5022 Chronic systolic (congestive) heart failure: Secondary | ICD-10-CM | POA: Diagnosis present

## 2021-06-19 DIAGNOSIS — N12 Tubulo-interstitial nephritis, not specified as acute or chronic: Secondary | ICD-10-CM | POA: Diagnosis present

## 2021-06-19 DIAGNOSIS — A419 Sepsis, unspecified organism: Secondary | ICD-10-CM | POA: Diagnosis not present

## 2021-06-19 DIAGNOSIS — R6521 Severe sepsis with septic shock: Secondary | ICD-10-CM | POA: Diagnosis present

## 2021-06-19 DIAGNOSIS — I255 Ischemic cardiomyopathy: Secondary | ICD-10-CM | POA: Diagnosis present

## 2021-06-19 DIAGNOSIS — A4159 Other Gram-negative sepsis: Principal | ICD-10-CM | POA: Diagnosis present

## 2021-06-19 DIAGNOSIS — Z79899 Other long term (current) drug therapy: Secondary | ICD-10-CM | POA: Diagnosis not present

## 2021-06-19 DIAGNOSIS — R55 Syncope and collapse: Secondary | ICD-10-CM | POA: Diagnosis present

## 2021-06-19 DIAGNOSIS — N390 Urinary tract infection, site not specified: Secondary | ICD-10-CM | POA: Diagnosis present

## 2021-06-19 DIAGNOSIS — Z882 Allergy status to sulfonamides status: Secondary | ICD-10-CM

## 2021-06-19 DIAGNOSIS — N179 Acute kidney failure, unspecified: Secondary | ICD-10-CM | POA: Diagnosis present

## 2021-06-19 DIAGNOSIS — C50911 Malignant neoplasm of unspecified site of right female breast: Secondary | ICD-10-CM | POA: Diagnosis present

## 2021-06-19 DIAGNOSIS — Z79811 Long term (current) use of aromatase inhibitors: Secondary | ICD-10-CM

## 2021-06-19 DIAGNOSIS — Z1611 Resistance to penicillins: Secondary | ICD-10-CM | POA: Diagnosis present

## 2021-06-19 DIAGNOSIS — Z7984 Long term (current) use of oral hypoglycemic drugs: Secondary | ICD-10-CM

## 2021-06-19 DIAGNOSIS — Z7902 Long term (current) use of antithrombotics/antiplatelets: Secondary | ICD-10-CM | POA: Diagnosis not present

## 2021-06-19 DIAGNOSIS — Z170 Estrogen receptor positive status [ER+]: Secondary | ICD-10-CM | POA: Diagnosis not present

## 2021-06-19 DIAGNOSIS — Z20822 Contact with and (suspected) exposure to covid-19: Secondary | ICD-10-CM | POA: Diagnosis present

## 2021-06-19 DIAGNOSIS — R7881 Bacteremia: Secondary | ICD-10-CM | POA: Diagnosis not present

## 2021-06-19 DIAGNOSIS — R7401 Elevation of levels of liver transaminase levels: Secondary | ICD-10-CM | POA: Diagnosis present

## 2021-06-19 DIAGNOSIS — I2510 Atherosclerotic heart disease of native coronary artery without angina pectoris: Secondary | ICD-10-CM | POA: Diagnosis present

## 2021-06-19 DIAGNOSIS — Z951 Presence of aortocoronary bypass graft: Secondary | ICD-10-CM

## 2021-06-19 DIAGNOSIS — Z888 Allergy status to other drugs, medicaments and biological substances status: Secondary | ICD-10-CM

## 2021-06-19 DIAGNOSIS — N201 Calculus of ureter: Secondary | ICD-10-CM

## 2021-06-19 DIAGNOSIS — R0789 Other chest pain: Secondary | ICD-10-CM | POA: Diagnosis not present

## 2021-06-19 DIAGNOSIS — N200 Calculus of kidney: Secondary | ICD-10-CM | POA: Diagnosis not present

## 2021-06-19 DIAGNOSIS — B961 Klebsiella pneumoniae [K. pneumoniae] as the cause of diseases classified elsewhere: Secondary | ICD-10-CM | POA: Diagnosis present

## 2021-06-19 DIAGNOSIS — Z9049 Acquired absence of other specified parts of digestive tract: Secondary | ICD-10-CM

## 2021-06-19 DIAGNOSIS — Z90710 Acquired absence of both cervix and uterus: Secondary | ICD-10-CM

## 2021-06-19 DIAGNOSIS — Z7982 Long term (current) use of aspirin: Secondary | ICD-10-CM

## 2021-06-19 DIAGNOSIS — I25119 Atherosclerotic heart disease of native coronary artery with unspecified angina pectoris: Secondary | ICD-10-CM | POA: Diagnosis not present

## 2021-06-19 DIAGNOSIS — I248 Other forms of acute ischemic heart disease: Secondary | ICD-10-CM | POA: Diagnosis not present

## 2021-06-19 DIAGNOSIS — Z955 Presence of coronary angioplasty implant and graft: Secondary | ICD-10-CM

## 2021-06-19 LAB — BASIC METABOLIC PANEL
Anion gap: 13 (ref 5–15)
BUN: 44 mg/dL — ABNORMAL HIGH (ref 8–23)
CO2: 17 mmol/L — ABNORMAL LOW (ref 22–32)
Calcium: 8.2 mg/dL — ABNORMAL LOW (ref 8.9–10.3)
Chloride: 103 mmol/L (ref 98–111)
Creatinine, Ser: 2.65 mg/dL — ABNORMAL HIGH (ref 0.44–1.00)
GFR, Estimated: 18 mL/min — ABNORMAL LOW (ref >60)
Glucose, Bld: 142 mg/dL — ABNORMAL HIGH (ref 70–99)
Potassium: 4.5 mmol/L (ref 3.5–5.1)
Sodium: 133 mmol/L — ABNORMAL LOW (ref 135–145)

## 2021-06-19 LAB — TROPONIN I (HIGH SENSITIVITY)
Troponin I (High Sensitivity): 13 ng/L (ref <18)
Troponin I (High Sensitivity): 10 ng/L (ref <18)

## 2021-06-19 LAB — RESP PANEL BY RT-PCR (FLU A&B, COVID) ARPGX2
Influenza A by PCR: NEGATIVE
Influenza B by PCR: NEGATIVE
SARS Coronavirus 2 by RT PCR: NEGATIVE

## 2021-06-19 LAB — CBC WITH DIFFERENTIAL/PLATELET
Band Neutrophils: 5 %
Basophils Absolute: 0 10*3/uL (ref 0.0–0.1)
Basophils Relative: 0 %
Eosinophils Absolute: 0 10*3/uL (ref 0.0–0.5)
Eosinophils Relative: 0 %
HCT: 41.9 % (ref 36.0–46.0)
Hemoglobin: 13.7 g/dL (ref 12.0–15.0)
Lymphocytes Relative: 1 %
Lymphs Abs: 0.2 10*3/uL — ABNORMAL LOW (ref 0.7–4.0)
MCH: 31.1 pg (ref 26.0–34.0)
MCHC: 32.7 g/dL (ref 30.0–36.0)
MCV: 95.2 fL (ref 80.0–100.0)
Monocytes Absolute: 0.4 10*3/uL (ref 0.1–1.0)
Monocytes Relative: 2 %
Neutro Abs: 18.6 10*3/uL — ABNORMAL HIGH (ref 1.7–7.7)
Neutrophils Relative %: 92 %
Platelets: 98 10*3/uL — ABNORMAL LOW (ref 150–400)
RBC: 4.4 MIL/uL (ref 3.87–5.11)
RDW: 13.8 % (ref 11.5–15.5)
WBC: 19.2 10*3/uL — ABNORMAL HIGH (ref 4.0–10.5)
nRBC: 0 % (ref 0.0–0.2)

## 2021-06-19 LAB — URINALYSIS, ROUTINE W REFLEX MICROSCOPIC
Bilirubin Urine: NEGATIVE
Glucose, UA: NEGATIVE mg/dL
Ketones, ur: NEGATIVE mg/dL
Nitrite: NEGATIVE
Protein, ur: 30 mg/dL — AB
Specific Gravity, Urine: 1.016 (ref 1.005–1.030)
pH: 5 (ref 5.0–8.0)

## 2021-06-19 LAB — LACTIC ACID, PLASMA
Lactic Acid, Venous: 2.7 mmol/L (ref 0.5–1.9)
Lactic Acid, Venous: 2 mmol/L (ref 0.5–1.9)

## 2021-06-19 LAB — BRAIN NATRIURETIC PEPTIDE: B Natriuretic Peptide: 1682 pg/mL — ABNORMAL HIGH (ref 0.0–100.0)

## 2021-06-19 LAB — D-DIMER, QUANTITATIVE: D-Dimer, Quant: 4.92 ug/mL-FEU — ABNORMAL HIGH (ref 0.00–0.50)

## 2021-06-19 LAB — MAGNESIUM: Magnesium: 1.8 mg/dL (ref 1.7–2.4)

## 2021-06-19 MED ORDER — SODIUM CHLORIDE 0.9 % IV BOLUS
1000.0000 mL | Freq: Once | INTRAVENOUS | Status: AC
  Administered 2021-06-19: 1000 mL via INTRAVENOUS

## 2021-06-19 MED ORDER — SODIUM CHLORIDE 0.9 % IV SOLN
1.0000 g | Freq: Once | INTRAVENOUS | Status: AC
  Administered 2021-06-19: 1 g via INTRAVENOUS
  Filled 2021-06-19: qty 10

## 2021-06-19 MED ORDER — SODIUM CHLORIDE 0.9 % IV SOLN: 500.0000 mg | INTRAVENOUS | Status: DC

## 2021-06-19 MED ORDER — FENTANYL CITRATE PF 50 MCG/ML IJ SOSY
25.0000 ug | PREFILLED_SYRINGE | Freq: Once | INTRAMUSCULAR | Status: AC
  Administered 2021-06-19: 25 ug via INTRAVENOUS
  Filled 2021-06-19: qty 1

## 2021-06-19 MED ORDER — NOREPINEPHRINE 4 MG/250ML-% IV SOLN: 0.0000 ug/min | INTRAVENOUS | Status: DC

## 2021-06-19 MED ORDER — SODIUM CHLORIDE 0.9 % IV SOLN
100.0000 mg | Freq: Once | INTRAVENOUS | Status: DC
  Filled 2021-06-19: qty 100

## 2021-06-19 NOTE — Progress Notes (Signed)
Urological Symptom Review  Patient is experiencing the following symptoms: Frequent urination Get up at night to urinate Blood in urine Injury to kidneys/bladder   Review of Systems  Gastrointestinal (upper)  : Nausea Vomiting  Gastrointestinal (lower) : Negative for lower GI symptoms  Constitutional : Fever Night Sweats Fatigue  Skin: Negative for skin symptoms  Eyes: Blurred vision  Ear/Nose/Throat : Negative for Ear/Nose/Throat symptoms  Hematologic/Lymphatic: Easy bruising  Cardiovascular : Leg swelling  Respiratory : Negative for respiratory symptoms  Endocrine: Excessive thirst  Musculoskeletal: Back pain Joint pain  Neurological: Headaches Dizziness  Psychologic: Negative for psychiatric symptoms

## 2021-06-19 NOTE — ED Provider Notes (Signed)
[facility] EMERGENCY DEPARTMENT Provider Note   CSN: [id number] Arrival date & time: 06/19/21  1634     History Chief Complaint  Patient presents with   Hypotension    Kimberly Flowers is a 80 y.o. female.  Patient presents to ER chief complaint of lightheadedness and syncope.  She has a history of kidney stone on the left side and was at her urologist office when she had a near syncopal episode states she is unsure if she really passed out or not.  She was noted to have low blood pressure in the 81K systolic and sent to the ER.  Patient otherwise denies any fevers at home denies any flank pain she had some pain yesterday but denies any pain today.  No reports of fevers or cough or vomiting or diarrhea.      Past Medical History:  Diagnosis Date   Anemia    Coronary atherosclerosis of native coronary artery    Previous PCI 2007-2008, Dr. Sharyon Cable   Essential hypertension, benign    History of kidney stones    Mixed hyperlipidemia    PAD (peripheral artery disease) ([place])     Absent right DP   Palpitations    Type 2 diabetes mellitus ([place])    Vasomotor rhinitis     Patient Active Problem List   Diagnosis Date Noted   Angina pectoris ([place]) 10/18/2020   Coronary artery disease involving native coronary artery of native heart with angina pectoris ([place])    Unstable angina ([place]) 08/16/2015   T2DM (type 2 diabetes mellitus) ([place]) 08/16/2015   Aortic regurgitation 09/20/2012   Palpitations 06/26/2009   Coronary atherosclerosis of native coronary artery 06/26/2009   Mixed hyperlipidemia 06/25/2009   Essential hypertension, benign 06/25/2009    Past Surgical History:  Procedure Laterality Date   ABDOMINAL HYSTERECTOMY     APPENDECTOMY     BREAST LUMPECTOMY WITH RADIOACTIVE SEED LOCALIZATION Right 04/16/2021   Procedure: RIGHT BREAST LUMPECTOMY WITH RADIOACTIVE SEED LOCALIZATION;  Surgeon: Stark Klein, MD;  Location: [place];  Service: General;  Laterality: Right;   BREAST  SURGERY     CARDIAC CATHETERIZATION N/A 08/17/2015   Procedure: Left Heart Cath and Coronary Angiography;  Surgeon: Burnell Blanks, MD;  Location: [place] [facility];  Service: Cardiovascular;  Laterality: N/A;   CHOLECYSTECTOMY     CORONARY ARTERY BYPASS GRAFT  2006   [name], single vessel   CORONARY CTO INTERVENTION N/A 08/15/2020   Procedure: CORONARY CTO INTERVENTION;  Surgeon: [name], [name], MD;  Location: [place] [facility];  Service: Cardiovascular;  Laterality: N/A;   CORONARY STENT INTERVENTION N/A 08/15/2020   Procedure: CORONARY STENT INTERVENTION;  Surgeon: [name], [name], MD;  Location: [place] [facility];  Service: Cardiovascular;  Laterality: N/A;   CORONARY STENT INTERVENTION N/A 10/18/2020   Procedure: CORONARY STENT INTERVENTION;  Surgeon: [name], [name], MD;  Location: [place] [facility];  Service: Cardiovascular;  Laterality: N/A;   CORONARY STENT INTERVENTION  10/18/2020   INTRAVASCULAR ULTRASOUND/IVUS N/A 08/15/2020   Procedure: Intravascular Ultrasound/IVUS;  Surgeon: [name], [name], MD;  Location: [place] [facility];  Service: Cardiovascular;  Laterality: N/A;   INTRAVASCULAR ULTRASOUND/IVUS N/A 10/18/2020   Procedure: Intravascular Ultrasound/IVUS;  Surgeon: [name], [name], MD;  Location: [place] [facility];  Service: Cardiovascular;  Laterality: N/A;   LEFT HEART CATH AND CORONARY ANGIOGRAPHY N/A 07/10/2020   Procedure: LEFT HEART CATH AND CORONARY ANGIOGRAPHY;  Surgeon: [name], MD;  Location: [place]  [facility];  Service: Cardiovascular;  Laterality: N/A;     OB History   No obstetric history on file.     Family History  Problem Relation Age of Onset   Cancer Sister        Breast   CAD Other        Family history    Social History   Tobacco Use   Smoking status: Never   Smokeless tobacco: Never  Vaping Use   Vaping Use: Never used  Substance Use Topics   Alcohol use: No    Alcohol/week: 0.0 standard drinks   Drug use: No     Home Medications Prior to Admission medications   Medication Sig Start Date End Date Taking? Authorizing Provider  acetaminophen (TYLENOL) 500 MG tablet Take 500-1,000 mg by mouth every 6 (six) hours as needed for moderate pain.    [provider]  amLODipine (NORVASC) 10 MG tablet TAKE 1 TABLET BY MOUTH EVERY DAY Patient taking differently: Take 10 mg by mouth daily. 12/27/20   Arnoldo Lenis, MD  Ascorbic Acid (VITAMIN C WITH ROSE HIPS) 500 MG tablet Take 1,000 mg by mouth daily.    [provider]  aspirin 81 MG EC tablet Take 81 mg by mouth daily.    [provider]  atorvastatin (LIPITOR) 80 MG tablet Take 1 tablet (80 mg total) by mouth daily. 12/27/20 06/03/22  Arnoldo Lenis, MD  Calcium Carb-Cholecalciferol (CALCIUM PLUS VITAMIN D3) 600-500 MG-UNIT CAPS Take 1 tablet by mouth daily.    [provider]  cholecalciferol (VITAMIN D3) 25 MCG (1000 UNIT) tablet Take 1,000 Units by mouth daily. In the morning    [provider]  ciprofloxacin (CIPRO) 500 MG tablet Take 500 mg by mouth 2 (two) times daily. 06/18/21   [provider]  clopidogrel (PLAVIX) 75 MG tablet Take 1 tablet (75 mg total) by mouth daily. 08/09/20   [name], [name], MD  docusate sodium (COLACE) 100 MG capsule Take 100 mg by mouth daily as needed for mild constipation or moderate constipation.     [provider]  eye wash (,SODIUM/POTASSIUM/SOD CHLORIDE,) SOLN Place 1 drop into both eyes 3 (three) times a week.    [provider]  ferrous gluconate (FERGON) 324 MG tablet Take 324 mg by mouth at bedtime.     [provider]  fexofenadine (ALLEGRA) 180 MG tablet Take 180 mg by mouth daily.    [provider]  Glucosamine-Chondroitin-MSM-D3 TABS Take 1 tablet by mouth daily.    [provider]  isosorbide mononitrate (IMDUR) 60 MG 24 hr tablet Take 1.5 tablets (90 mg total) by mouth daily. 06/03/21   Arnoldo Lenis,  MD  letrozole [facility]) 2.5 MG tablet Take 1 tablet by mouth daily. 05/03/21 05/03/22  [provider]  losartan (COZAAR) 50 MG tablet Take 50 mg by mouth daily.    [provider]  metFORMIN (GLUCOPHAGE) 500 MG tablet Take 500 mg by mouth every evening.     [provider]  Multiple Vitamin (MULTIVITAMIN WITH MINERALS) TABS tablet Take 1 tablet by mouth every evening.    [provider]  Multiple Vitamins-Minerals (ICAPS AREDS 2 PO) Take 1 tablet by mouth at bedtime.    [provider]  Multiple Vitamins-Minerals (PRESERVISION AREDS 2 PO) Take 1 tablet by mouth in the morning and at bedtime.    [provider]  Multiple Vitamins-Minerals (PRESERVISION AREDS 2+MULTI VIT PO) Take by mouth.  [provider]  nebivolol (BYSTOLIC) 10 MG tablet Take 1 tablet (10 mg total) by mouth daily. 07/20/20   Verta Ellen., NP  nitroGLYCERIN (NITROLINGUAL) 0.4 MG/SPRAY spray Place 1 spray under the tongue every 5 (five) minutes x 3 doses as needed for chest pain (if no relief after 3rd dose, proceed to the ED for an evaluation). 06/03/21   Arnoldo Lenis, MD  Omega-3 Fatty Acids (FISH OIL PO) Take 2,000 mg by mouth daily.    [provider]  pantoprazole (PROTONIX) 40 MG tablet Take 1 tablet (40 mg total) by mouth daily. 08/09/20   [name], [name], MD    Allergies    Ranolazine er and Sulfonamide derivatives  Review of Systems   Review of Systems  Constitutional:  Negative for fever.  HENT:  Negative for ear pain.   Eyes:  Negative for pain.  Respiratory:  Negative for cough.   Cardiovascular:  Negative for chest pain.  Gastrointestinal:  Negative for abdominal pain.  Genitourinary:  Positive for flank pain.  Musculoskeletal:  Negative for back pain.  Skin:  Negative for rash.  Neurological:  Negative for headaches.   Physical Exam Updated Vital Signs BP (!) 100/55   Pulse 65   Temp (!) 97.5 F (36.4 C) (Oral)   Resp  15   Ht 5' 5.5" (1.664 m)   Wt 81.6 kg   SpO2 91%   BMI 29.50 kg/m   Physical Exam Constitutional:      General: She is not in acute distress.    Appearance: Normal appearance.  HENT:     Head: Normocephalic.     Nose: Nose normal.  Eyes:     Extraocular Movements: Extraocular movements intact.  Cardiovascular:     Rate and Rhythm: Normal rate.  Pulmonary:     Effort: Pulmonary effort is normal.  Abdominal:     Tenderness: There is no abdominal tenderness. There is no guarding or rebound.  Musculoskeletal:        General: Normal range of motion.     Cervical back: Normal range of motion.  Neurological:     General: No focal deficit present.     Mental Status: She is alert. Mental status is at baseline.    ED Results / Procedures / Treatments   Labs (all labs ordered are listed, but only abnormal results are displayed) Labs Reviewed  CBC WITH DIFFERENTIAL/PLATELET - Abnormal; Notable for the following components:      Result Value   WBC 19.2 (*)    Platelets 98 (*)    Neutro Abs 18.6 (*)    Lymphs Abs 0.2 (*)    All other components within normal limits  BASIC METABOLIC PANEL - Abnormal; Notable for the following components:   Sodium 133 (*)    CO2 17 (*)    Glucose, Bld 142 (*)    BUN 44 (*)    Creatinine, Ser 2.65 (*)    Calcium 8.2 (*)    GFR, Estimated 18 (*)    All other components within normal limits  LACTIC ACID, PLASMA - Abnormal; Notable for the following components:   Lactic Acid, Venous 2.7 (*)    All other components within normal limits  LACTIC ACID, PLASMA - Abnormal; Notable for the following components:   Lactic Acid, Venous 2.0 (*)    All other components within normal limits  BRAIN NATRIURETIC PEPTIDE - Abnormal; Notable for the following components:   B Natriuretic Peptide 1,682.0 (*)  All other components within normal limits  D-DIMER, QUANTITATIVE - Abnormal; Notable for the following components:   D-Dimer, Quant 4.92 (*)    All  other components within normal limits  CULTURE, BLOOD (ROUTINE X 2)  CULTURE, BLOOD (ROUTINE X 2)  RESP PANEL BY RT-PCR (FLU A&B, COVID) ARPGX2  URINE CULTURE  MAGNESIUM  URINALYSIS, ROUTINE W REFLEX MICROSCOPIC  TROPONIN I (HIGH SENSITIVITY)  TROPONIN I (HIGH SENSITIVITY)    EKG None  Radiology DG Abd 1 View  Result Date: 06/19/2021 CLINICAL DATA:  Left nephrolithiasis EXAM: ABDOMEN - 1 VIEW COMPARISON:  Concurrently performed CT examination FINDINGS: 9 mm calculus is seen within the expected left proximal ureter at the level of the inferior endplate of L3. Vascular calcifications are seen within the left upper quadrant. Phleboliths noted within the left hemipelvis. Normal abdominal gas pattern. No organomegaly. Surgical clips noted within the epigastrium and overlying the right lung base. Cholecystectomy clips seen within the right upper quadrant. IMPRESSION: 9 mm calculus within the proximal left ureter at the level of the inferior endplate of L3. Electronically Signed   By: Fidela Salisbury M.D.   On: 06/19/2021 20:26   DG Chest Port 1 View  Result Date: 06/19/2021 CLINICAL DATA:  80 year old female with history of shortness of breath. EXAM: PORTABLE CHEST 1 VIEW COMPARISON:  No priors. FINDINGS: Lung volumes are low. Elevation of the right hemidiaphragm. Opacity in the left lung base partially obscuring the left hemidiaphragm. No pleural effusions. No pneumothorax. No pulmonary nodule or mass noted. Pulmonary vasculature is normal. Heart size appears borderline enlarged. Upper mediastinal contours are within normal limits. Atherosclerotic calcifications in the thoracic aorta. Status post median sternotomy. Surgical clips near the gastroesophageal junction. Multiple surgical clips also project over the right upper quadrant of the abdomen. IMPRESSION: 1. Atelectasis and/or consolidation in the left lower lobe. 2. Low lung volumes with elevation of the right hemidiaphragm. 3. Aortic  atherosclerosis. Electronically Signed   By: Vinnie Langton M.D.   On: 06/19/2021 17:52   CT Renal Stone Study  Result Date: 06/19/2021 CLINICAL DATA:  Urinary tract stone, symptomatic/complicated. Hypotension, syncope EXAM: CT ABDOMEN AND PELVIS WITHOUT CONTRAST TECHNIQUE: Multidetector CT imaging of the abdomen and pelvis was performed following the standard protocol without IV contrast. COMPARISON:  06/18/2021 FINDINGS: Lower chest: 4 mm right middle lobe nodule again noted. Bibasilar atelectasis noted, progressive since prior examination. Long segment stenting the right coronary artery. Mild coronary artery calcification. Mild global cardiomegaly. No pericardial effusion. Postsurgical changes noted within the right breast. Hepatobiliary: No focal liver abnormality is seen. Status post cholecystectomy. No biliary dilatation. 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 The kidneys are normal in size and position. Moderate left hydronephrosis and perinephric stranding is again identified secondary to an obstructing 4 x 5 x 9 mm calculus within the left ureteropelvic junction, unchanged from prior examination. Mildly complex, faintly rim calcified 2.6 cm cortical cyst noted within the left kidney. No additional nephro or urolithiasis. No hydronephrosis on the right. The bladder is unremarkable. Stomach/Bowel: Stomach, small bowel, and large bowel are unremarkable. Appendix absent. No free intraperitoneal gas or fluid. Vascular/Lymphatic: Aortic atherosclerosis. No enlarged abdominal or pelvic lymph nodes. Reproductive: Status post hysterectomy. No adnexal masses. Other: Tiny bilateral fat containing inguinal hernias. Musculoskeletal: Osseous structures are diffusely osteopenic. Degenerative changes seen within the lumbar spine. No lytic or blastic bone lesion. IMPRESSION: Stable examination with obstructing 4 x 5 x 9 mm calculus within  the left  ureteropelvic junction resulting in moderate left hydronephrosis and perinephric stranding. 4 mm right middle lobe pulmonary nodule. No follow-up needed if patient is low-risk. Non-contrast chest CT can be considered in 12 months if patient is high-risk. This recommendation follows the consensus statement: Guidelines for Management of Incidental Pulmonary Nodules Detected on CT Images: From the Fleischner Society 2017; Radiology 2017; 284:228-243. 2.6 cm rim calcified mildly complex cyst within the left kidney, not well characterized on this single-phase examination. Dedicated renal sonography is recommended for further evaluation once the patient's acute issues have resolved. Aortic Atherosclerosis (ICD10-I70.0). Electronically Signed   By: Fidela Salisbury M.D.   On: 06/19/2021 20:24    Procedures .Critical Care Performed by: Luna Fuse, MD Authorized by: Luna Fuse, MD   Critical care provider statement:    Critical care time (minutes):  40   Critical care time was exclusive of:  Separately billable procedures and treating other patients and teaching time   Critical care was necessary to treat or prevent imminent or life-threatening deterioration of the following conditions:  Sepsis   Medications Ordered in ED Medications  norepinephrine (LEVOPHED) 4mg  in 254mL premix infusion (has no administration in time range)  sodium chloride 0.9 % bolus 1,000 mL (1,000 mLs Intravenous New Bag/Given 06/19/21 1821)  sodium chloride 0.9 % bolus 1,000 mL (1,000 mLs Intravenous New Bag/Given 06/19/21 2028)  cefTRIAXone (ROCEPHIN) 1 g in sodium chloride 0.9 % 100 mL IVPB (0 g Intravenous Stopped 06/19/21 2026)    ED Course  I have reviewed the triage vital signs and the nursing notes.  Pertinent labs & imaging results that were available during my care of the patient were reviewed by me and considered in my medical decision making (see chart for details).    MDM Rules/Calculators/A&P                            White count elevated 19.  Creatinine 2.6 BUN 44.  Patient given reports of fluids and blood pressure appears to respond.  Second liter provided blood pressure now over 536 systolic.  Patient started on Rocephin.  Levophed started as well.  CT imaging shows consistent evidence of kidney stone on the left side with moderate hydronephrosis but patient denies any pain.  Case discussed with urology Dr.Mcdermid, who will see the patient at [facility].  Case discussed with ICU for admission. Final Clinical Impression(s) / ED Diagnoses Final diagnoses:  Sepsis, due to unspecified organism, unspecified whether acute organ dysfunction present ([place])  AKI (acute kidney injury) ([place])  Hypotension, unspecified hypotension type    Rx / DC Orders ED Discharge Orders     None        Luna Fuse, MD 06/19/21 2056

## 2021-06-19 NOTE — Progress Notes (Signed)
Assessment: 1. Ureteral calculus, left   2. Hypotension, unspecified hypotension type   3. Coronary artery disease involving native coronary artery of native heart with angina pectoris [facility])     Plan: I reviewed her CT study from 06/18/2021 as well as the KUB from this afternoon.  She has a proximal ureteral calculus.  Options for management discussed including spontaneous passage, shockwave lithotripsy, ureteral stent placement, and ureteroscopic stone manipulation with laser lithotripsy.  She is currently hypotensive in the office.  Given her degree of symptoms and cardiac history, I have recommended that she be transported to the emergency room for further evaluation and management.   We will make arrangements for urgent transport. Further management of the ureteral calculus can be discussed once she has been stabilized. She would need to hold the Plavix for at least 5 days prior to treatment for the stone. Continue pain management as needed.   Chief Complaint:  Chief Complaint  Patient presents with   Nephrolithiasis    History of Present Illness:  Kimberly Flowers is a 80 y.o. year old female who is seen in consultation from Jerene Bears B, MD for evaluation of left ureteral calculus.  She had acute onset of left-sided flank pain on 06/18/21.  She also had associated nausea and vomiting.  He has had subjective chills.  She was seen by her PCP and sent for CT imaging.  CT imaging from 06/18/2021 showed a 9 mm calculus in the left proximal ureter with mild left hydronephrosis.  She has taken several doses of Vicodin for pain control.  She is tolerating water but has not had any solid food other than crackers.  She currently feels dizzy and lightheaded.  She is not having any dysuria or gross hematuria.  Her pain is controlled at the present time. She was diagnosed with a left-sided stone in May 2022.  CT imaging at that time showed a 5 mm calculus at the left UPJ with mild obstructive  changes.  She is not aware of passing a stone. KUB from today shows a 4 x 9 mm calcification to the left of L3 transverse process consistent with a proximal ureteral calculus.  She is on Plavix for coronary artery disease and cardiac stents.  She has discontinued this for prior surgical procedures.  Past Medical History:  Past Medical History:  Diagnosis Date   Anemia    Coronary atherosclerosis of native coronary artery    Previous PCI 2007-2008, Dr. Sharyon Cable   Essential hypertension, benign    History of kidney stones    Mixed hyperlipidemia    PAD (peripheral artery disease) ([place])     Absent right DP   Palpitations    Type 2 diabetes mellitus ([place])    Vasomotor rhinitis     Past Surgical History:  Past Surgical History:  Procedure Laterality Date   ABDOMINAL HYSTERECTOMY     APPENDECTOMY     BREAST LUMPECTOMY WITH RADIOACTIVE SEED LOCALIZATION Right 04/16/2021   Procedure: RIGHT BREAST LUMPECTOMY WITH RADIOACTIVE SEED LOCALIZATION;  Surgeon: Stark Klein, MD;  Location: [place];  Service: General;  Laterality: Right;   BREAST SURGERY     CARDIAC CATHETERIZATION N/A 08/17/2015   Procedure: Left Heart Cath and Coronary Angiography;  Surgeon: Burnell Blanks, MD;  Location: [place] [facility];  Service: Cardiovascular;  Laterality: N/A;   CHOLECYSTECTOMY     CORONARY ARTERY BYPASS GRAFT  2006   [name], single vessel   CORONARY CTO INTERVENTION N/A 08/15/2020  Procedure: CORONARY CTO INTERVENTION;  Surgeon: [name], [name], MD;  Location: [place] [facility];  Service: Cardiovascular;  Laterality: N/A;   CORONARY STENT INTERVENTION N/A 08/15/2020   Procedure: CORONARY STENT INTERVENTION;  Surgeon: [name], [name], MD;  Location: [place] [facility];  Service: Cardiovascular;  Laterality: N/A;   CORONARY STENT INTERVENTION N/A 10/18/2020   Procedure: CORONARY STENT INTERVENTION;  Surgeon: [name], [name], MD;  Location: [place] [facility];  Service: Cardiovascular;   Laterality: N/A;   CORONARY STENT INTERVENTION  10/18/2020   INTRAVASCULAR ULTRASOUND/IVUS N/A 08/15/2020   Procedure: Intravascular Ultrasound/IVUS;  Surgeon: [name], [name], MD;  Location: [place] [facility];  Service: Cardiovascular;  Laterality: N/A;   INTRAVASCULAR ULTRASOUND/IVUS N/A 10/18/2020   Procedure: Intravascular Ultrasound/IVUS;  Surgeon: [name], [name], MD;  Location: [place] [facility];  Service: Cardiovascular;  Laterality: N/A;   LEFT HEART CATH AND CORONARY ANGIOGRAPHY N/A 07/10/2020   Procedure: LEFT HEART CATH AND CORONARY ANGIOGRAPHY;  Surgeon: [name], MD;  Location: [place] [facility];  Service: Cardiovascular;  Laterality: N/A;    Allergies:  Allergies  Allergen Reactions   Ranolazine Er Other (See Comments)    Dizziness    Sulfonamide Derivatives     Unknown    Family History:  Family History  Problem Relation Age of Onset   Cancer Sister        Breast   CAD Other        Family history    Social History:  Social History   Tobacco Use   Smoking status: Never   Smokeless tobacco: Never  Vaping Use   Vaping Use: Never used  Substance Use Topics   Alcohol use: No    Alcohol/week: 0.0 standard drinks   Drug use: No    Review of symptoms:  Constitutional:  Negative for unexplained weight loss ENT:  Negative for nose bleeds, sinus pain, painful swallowing CV:  Negative for chest pain, shortness of breath, exercise intolerance, palpitations, loss of consciousness Resp:  Negative for cough, wheezing, shortness of breath GI:  Negative for diarrhea, bloody stools GU:  Positives noted in HPI; otherwise negative for gross hematuria, dysuria, urinary incontinence Neuro:  Negative for seizures, limb weakness, slurred speech Psych:  Negative for lack of energy, depression, anxiety Endocrine:  Negative for polydipsia, polyuria, symptoms of hypoglycemia (dizziness, hunger, sweating) Hematologic:  Negative for anemia, purpura, petechia, prolonged  or excessive bleeding, use of anticoagulants  Allergic:  Negative for difficulty breathing or choking as a result of exposure to anything; no shellfish allergy; no allergic response (rash/itch) to materials, foods  Physical exam: BP (!) 77/51   Pulse 76   Temp 98 F (36.7 C)   Wt 180 lb (81.6 kg)   BMI 29.50 kg/m  GENERAL APPEARANCE:  Well appearing, well developed, well nourished, NAD HEENT: Atraumatic, Normocephalic, oropharynx clear. NECK: Supple without lymphadenopathy or thyromegaly. LUNGS: Clear to auscultation bilaterally. HEART: Regular Rate and Rhythm without murmurs, gallops, or rubs. ABDOMEN: Soft, non-tender, No Masses. EXTREMITIES: Moves all extremities well.  Without clubbing, cyanosis, or edema. NEUROLOGIC:  Alert and oriented x 3, normal gait, CN II-XII grossly intact.  MENTAL STATUS:  Appropriate. BACK:  Non-tender to palpation.  No CVAT SKIN:  Warm, dry and intact.    Results: No specimen provided

## 2021-06-19 NOTE — ED Notes (Signed)
Patient transported to CT 

## 2021-06-19 NOTE — H&P (Signed)
NAME:  Kimberly Flowers, MRN:  [id number], DOB:  04-02-1941, LOS: 0 ADMISSION DATE:  06/19/2021, CONSULTATION DATE:  06/20/2021 REFERRING MD:  Almyra Free - ED AP, CHIEF COMPLAINT:  septic shock   History of Present Illness:  80 year old woman who is being transferred to [facility] for management of septic shock due to left pyelonephritis related to renal stone.   She developed left flank pain, nausea and vomiting and chills on 10/11. She was seen by urology as outpatient who recommended hospitalization and surgical management as she was hypotensive in Dr Carlyle Lipa office.   Significant CVD history with prior CABG  - last cath 10/2020 - PCI LADm - DAPT indefinitely.  EF normal as of 2019   Pertinent  Medical History   Past Medical History:  Diagnosis Date   Anemia    Coronary atherosclerosis of native coronary artery    Previous PCI 2007-2008, Dr. Sharyon Cable   Essential hypertension, benign    History of kidney stones    Mixed hyperlipidemia    PAD (peripheral artery disease) ([place])     Absent right DP   Palpitations    Type 2 diabetes mellitus ([place])    Vasomotor rhinitis    Significant Hospital Events: Including procedures, antibiotic start and stop dates in addition to other pertinent events   10/12 seen AP - received 2L fluids and started on NE.  10/12 CT shows stone obstructing L ureteropelvic junction. Renal cyst. 14mm pulmonary nodule.  10/13 transferred to [facility] ICU  Interim History / Subjective:  Improved BP, NE weaned off on arrival. Flank pain now down from 10/10 to 4/10  Objective   Blood pressure 122/62, pulse 69, temperature (!) 97.5 F (36.4 C), temperature source Oral, resp. rate 19, height 5' 5.5" (1.664 m), weight 81.6 kg, SpO2 94 %.        Intake/Output Summary (Last 24 hours) at 06/19/2021 2253 Last data filed at 06/19/2021 2114 Gross per 24 hour  Intake 1100 ml  Output --  Net 1100 ml   Filed Weights   06/19/21 1639  Weight: 81.6 kg    Examination: General: in  no distress HENT: tacky oral mucosae Lungs: clear Cardiovascular: HS normal  Abdomen: left flank pain Extremities: no edema. Normal capillary refill Neuro: sensorium intact with no focal deficits GU: amber urine in foley  Resolved Hospital Problem list   N/a  Assessment & Plan:   Critically ill due to septic shock caused by pyelonephritis due to left obstructing renal calculus Coronary artery disease Peripheral artery disease Dyslipidemia Type 2 DM  Septic shock appears controled but still needs definitive source control   Plan:   - For urologic intervention on arrival - Titrate NE to keep MAP > 65  - Formal echo in am.  - Resume DAPT post intervention   Best Practice (right click and "Reselect all SmartList Selections" daily)   Diet/type: NPO DVT prophylaxis: prophylactic heparin  and DAPT GI prophylaxis: N/A Lines: N/A Foley:  Yes, and it is still needed Code Status:  full code Last date of multidisciplinary goals of care discussion [pending]  Labs   CBC: Recent Labs  Lab 06/19/21 1809  WBC 19.2*  NEUTROABS 18.6*  HGB 13.7  HCT 41.9  MCV 95.2  PLT 98*    Basic Metabolic Panel: Recent Labs  Lab 06/19/21 1809  NA 133*  K 4.5  CL 103  CO2 17*  GLUCOSE 142*  BUN 44*  CREATININE 2.65*  CALCIUM 8.2*  MG 1.8  GFR: Estimated Creatinine Clearance: 18.7 mL/min (A) (by C-G formula based on SCr of 2.65 mg/dL (H)). Recent Labs  Lab 06/19/21 1809 06/19/21 2022  WBC 19.2*  --   LATICACIDVEN 2.7* 2.0*    Liver Function Tests: No results for input(s): AST, ALT, ALKPHOS, BILITOT, PROT, ALBUMIN in the last 168 hours. No results for input(s): LIPASE, AMYLASE in the last 168 hours. No results for input(s): AMMONIA in the last 168 hours.  ABG No results found for: PHART, PCO2ART, PO2ART, HCO3, TCO2, ACIDBASEDEF, O2SAT   Coagulation Profile: No results for input(s): INR, PROTIME in the last 168 hours.  Cardiac Enzymes: No results for input(s):  CKTOTAL, CKMB, CKMBINDEX, TROPONINI in the last 168 hours.  HbA1C: Hgb A1c MFr Bld  Date/Time Value Ref Range Status  04/11/2021 02:40 PM 6.7 (H) 4.8 - 5.6 % Final    Comment:    (NOTE) Pre diabetes:          5.7%-6.4%  Diabetes:              >6.4%  Glycemic control for   <7.0% adults with diabetes   08/17/2015 03:54 AM 6.3 (H) 4.8 - 5.6 % Final    Comment:    (NOTE)         Pre-diabetes: 5.7 - 6.4         Diabetes: >6.4         Glycemic control for adults with diabetes: <7.0     CBG: No results for input(s): GLUCAP in the last 168 hours.  Review of Systems:   Review of Systems  Constitutional:  Positive for chills, fever and malaise/fatigue.  HENT: Negative.    Eyes: Negative.   Respiratory: Negative.    Cardiovascular:  Positive for palpitations.  Gastrointestinal:  Positive for nausea and vomiting.  Genitourinary:  Positive for flank pain.  Neurological: Negative.   Endo/Heme/Allergies: Negative.   Psychiatric/Behavioral: Negative.      Past Medical History:  She,  has a past medical history of Anemia, Coronary atherosclerosis of native coronary artery, Essential hypertension, benign, History of kidney stones, Mixed hyperlipidemia, PAD (peripheral artery disease) ([place]), Palpitations, Type 2 diabetes mellitus ([place]), and Vasomotor rhinitis.   Surgical History:   Past Surgical History:  Procedure Laterality Date   ABDOMINAL HYSTERECTOMY     APPENDECTOMY     BREAST LUMPECTOMY WITH RADIOACTIVE SEED LOCALIZATION Right 04/16/2021   Procedure: RIGHT BREAST LUMPECTOMY WITH RADIOACTIVE SEED LOCALIZATION;  Surgeon: Stark Klein, MD;  Location: [place];  Service: General;  Laterality: Right;   BREAST SURGERY     CARDIAC CATHETERIZATION N/A 08/17/2015   Procedure: Left Heart Cath and Coronary Angiography;  Surgeon: Burnell Blanks, MD;  Location: [place] [facility];  Service: Cardiovascular;  Laterality: N/A;   CHOLECYSTECTOMY     CORONARY ARTERY BYPASS GRAFT  2006    [name], single vessel   CORONARY CTO INTERVENTION N/A 08/15/2020   Procedure: CORONARY CTO INTERVENTION;  Surgeon: [name], [name], MD;  Location: [place] [facility];  Service: Cardiovascular;  Laterality: N/A;   CORONARY STENT INTERVENTION N/A 08/15/2020   Procedure: CORONARY STENT INTERVENTION;  Surgeon: [name], [name], MD;  Location: [place] [facility];  Service: Cardiovascular;  Laterality: N/A;   CORONARY STENT INTERVENTION N/A 10/18/2020   Procedure: CORONARY STENT INTERVENTION;  Surgeon: [name], [name], MD;  Location: [place] [facility];  Service: Cardiovascular;  Laterality: N/A;   CORONARY STENT INTERVENTION  10/18/2020   INTRAVASCULAR ULTRASOUND/IVUS N/A 08/15/2020   Procedure:  Intravascular Ultrasound/IVUS;  Surgeon: [name], [name], MD;  Location: [place] [facility];  Service: Cardiovascular;  Laterality: N/A;   INTRAVASCULAR ULTRASOUND/IVUS N/A 10/18/2020   Procedure: Intravascular Ultrasound/IVUS;  Surgeon: [name], [name], MD;  Location: [place] [facility];  Service: Cardiovascular;  Laterality: N/A;   LEFT HEART CATH AND CORONARY ANGIOGRAPHY N/A 07/10/2020   Procedure: LEFT HEART CATH AND CORONARY ANGIOGRAPHY;  Surgeon: [name], MD;  Location: [place] [facility];  Service: Cardiovascular;  Laterality: N/A;     Social History:   reports that she has never smoked. She has never used smokeless tobacco. She reports that she does not drink alcohol and does not use drugs.   Family History:  Her family history includes CAD in an other family member; Cancer in her sister.   Allergies Allergies  Allergen Reactions   Ranolazine Er Other (See Comments)    Dizziness    Sulfonamide Derivatives     Unknown     Home Medications  Prior to Admission medications   Medication Sig Start Date End Date Taking? Authorizing Provider  acetaminophen (TYLENOL) 500 MG tablet Take 500-1,000 mg by mouth every 6 (six) hours as needed for moderate pain.    [provider]   amLODipine (NORVASC) 10 MG tablet TAKE 1 TABLET BY MOUTH EVERY DAY Patient taking differently: Take 10 mg by mouth daily. 12/27/20   Arnoldo Lenis, MD  Ascorbic Acid (VITAMIN C WITH ROSE HIPS) 500 MG tablet Take 1,000 mg by mouth daily.    [provider]  aspirin 81 MG EC tablet Take 81 mg by mouth daily.    [provider]  atorvastatin (LIPITOR) 80 MG tablet Take 1 tablet (80 mg total) by mouth daily. 12/27/20 06/03/22  Arnoldo Lenis, MD  Calcium Carb-Cholecalciferol (CALCIUM PLUS VITAMIN D3) 600-500 MG-UNIT CAPS Take 1 tablet by mouth daily.    [provider]  cholecalciferol (VITAMIN D3) 25 MCG (1000 UNIT) tablet Take 1,000 Units by mouth daily. In the morning    [provider]  ciprofloxacin (CIPRO) 500 MG tablet Take 500 mg by mouth 2 (two) times daily. 06/18/21   [provider]  clopidogrel (PLAVIX) 75 MG tablet Take 1 tablet (75 mg total) by mouth daily. 08/09/20   [name], [name], MD  docusate sodium (COLACE) 100 MG capsule Take 100 mg by mouth daily as needed for mild constipation or moderate constipation.     [provider]  eye wash (,SODIUM/POTASSIUM/SOD CHLORIDE,) SOLN Place 1 drop into both eyes 3 (three) times a week.    [provider]  ferrous gluconate (FERGON) 324 MG tablet Take 324 mg by mouth at bedtime.     [provider]  fexofenadine (ALLEGRA) 180 MG tablet Take 180 mg by mouth daily.    [provider]  Glucosamine-Chondroitin-MSM-D3 TABS Take 1 tablet by mouth daily.    [provider]  isosorbide mononitrate (IMDUR) 60 MG 24 hr tablet Take 1.5 tablets (90 mg total) by mouth daily. 06/03/21   Arnoldo Lenis, MD  letrozole [facility]) 2.5 MG tablet Take 1 tablet by mouth daily. 05/03/21 05/03/22  [provider]  losartan (COZAAR) 50 MG tablet Take 50 mg by mouth daily.    [provider]  metFORMIN (GLUCOPHAGE) 500 MG tablet Take 500 mg by mouth every  evening.     [provider]  Multiple Vitamin (MULTIVITAMIN WITH MINERALS) TABS tablet Take 1 tablet by mouth every evening.  [provider]  Multiple Vitamins-Minerals (ICAPS AREDS 2 PO) Take 1 tablet by mouth at bedtime.    [provider]  Multiple Vitamins-Minerals (PRESERVISION AREDS 2 PO) Take 1 tablet by mouth in the morning and at bedtime.    [provider]  Multiple Vitamins-Minerals (PRESERVISION AREDS 2+MULTI VIT PO) Take by mouth.    [provider]  nebivolol (BYSTOLIC) 10 MG tablet Take 1 tablet (10 mg total) by mouth daily. 07/20/20   Verta Ellen., NP  nitroGLYCERIN (NITROLINGUAL) 0.4 MG/SPRAY spray Place 1 spray under the tongue every 5 (five) minutes x 3 doses as needed for chest pain (if no relief after 3rd dose, proceed to the ED for an evaluation). 06/03/21   Arnoldo Lenis, MD  Omega-3 Fatty Acids (FISH OIL PO) Take 2,000 mg by mouth daily.    [provider]  pantoprazole (PROTONIX) 40 MG tablet Take 1 tablet (40 mg total) by mouth daily. 08/09/20   [name], [name], MD    CRITICAL CARE Performed by: Kipp Brood   Total critical care time: 40 minutes  Critical care time was exclusive of separately billable procedures and treating other patients.  Critical care was necessary to treat or prevent imminent or life-threatening deterioration.  Critical care was time spent personally by me on the following activities: development of treatment plan with patient and/or surrogate as well as nursing, discussions with consultants, evaluation of patient's response to treatment, examination of patient, obtaining history from patient or surrogate, ordering and performing treatments and interventions, ordering and review of laboratory studies, ordering and review of radiographic studies, pulse oximetry, re-evaluation of patient's condition and participation in multidisciplinary rounds.  Kipp Brood, MD [facility] ICU  Physician [place]  Pager: [phone] Mobile: [phone] After hours: [phone].

## 2021-06-19 NOTE — ED Notes (Signed)
Bladder scanned pt. Pt. Has 140 ml in their bladder.

## 2021-06-19 NOTE — ED Triage Notes (Signed)
Pt presents to ED via [facility] for near syncope episode at Urologist office. Pt was being seen for 12mm kidney stone, felt like she was going to pass out, pt BP 70/30, pt orthostatic. Pt given 200 ml NS bolus, CBG 178. Pt initial sat 88% placed on 2L and increased to 98%

## 2021-06-20 ENCOUNTER — Inpatient Hospital Stay (HOSPITAL_COMMUNITY): Payer: Medicare Other

## 2021-06-20 ENCOUNTER — Inpatient Hospital Stay (HOSPITAL_COMMUNITY): Payer: Medicare Other | Admitting: Certified Registered Nurse Anesthetist

## 2021-06-20 ENCOUNTER — Encounter (HOSPITAL_COMMUNITY): Payer: Self-pay | Admitting: Pulmonary Disease

## 2021-06-20 ENCOUNTER — Encounter (HOSPITAL_COMMUNITY)
Admission: EM | Disposition: A | Discharge status: Home / Self Care | Payer: Self-pay | Source: Home / Self Care | Attending: Internal Medicine

## 2021-06-20 DIAGNOSIS — N12 Tubulo-interstitial nephritis, not specified as acute or chronic: Secondary | ICD-10-CM | POA: Diagnosis not present

## 2021-06-20 DIAGNOSIS — N390 Urinary tract infection, site not specified: Secondary | ICD-10-CM

## 2021-06-20 DIAGNOSIS — N200 Calculus of kidney: Secondary | ICD-10-CM

## 2021-06-20 DIAGNOSIS — I248 Other forms of acute ischemic heart disease: Secondary | ICD-10-CM | POA: Diagnosis not present

## 2021-06-20 DIAGNOSIS — A419 Sepsis, unspecified organism: Secondary | ICD-10-CM | POA: Diagnosis present

## 2021-06-20 DIAGNOSIS — R6521 Severe sepsis with septic shock: Secondary | ICD-10-CM

## 2021-06-20 HISTORY — PX: CYSTOSCOPY W/ URETERAL STENT PLACEMENT: SHX1429

## 2021-06-20 LAB — GLUCOSE, CAPILLARY
Glucose-Capillary: 124 mg/dL — ABNORMAL HIGH (ref 70–99)
Glucose-Capillary: 88 mg/dL (ref 70–99)
Glucose-Capillary: 125 mg/dL — ABNORMAL HIGH (ref 70–99)
Glucose-Capillary: 132 mg/dL — ABNORMAL HIGH (ref 70–99)
Glucose-Capillary: 138 mg/dL — ABNORMAL HIGH (ref 70–99)
Glucose-Capillary: 127 mg/dL — ABNORMAL HIGH (ref 70–99)

## 2021-06-20 LAB — CBC WITH DIFFERENTIAL/PLATELET
Abs Immature Granulocytes: 0.12 10*3/uL — ABNORMAL HIGH (ref 0.00–0.07)
Basophils Absolute: 0 10*3/uL (ref 0.0–0.1)
Basophils Relative: 0 %
Eosinophils Absolute: 0 10*3/uL (ref 0.0–0.5)
Eosinophils Relative: 0 %
HCT: 38.1 % (ref 36.0–46.0)
Hemoglobin: 13 g/dL (ref 12.0–15.0)
Immature Granulocytes: 1 %
Lymphocytes Relative: 3 %
Lymphs Abs: 0.3 10*3/uL — ABNORMAL LOW (ref 0.7–4.0)
MCH: 31.3 pg (ref 26.0–34.0)
MCHC: 34.1 g/dL (ref 30.0–36.0)
MCV: 91.6 fL (ref 80.0–100.0)
Monocytes Absolute: 0.4 10*3/uL (ref 0.1–1.0)
Monocytes Relative: 4 %
Neutro Abs: 8.7 10*3/uL — ABNORMAL HIGH (ref 1.7–7.7)
Neutrophils Relative %: 92 %
Platelets: 126 10*3/uL — ABNORMAL LOW (ref 150–400)
RBC: 4.16 MIL/uL (ref 3.87–5.11)
RDW: 14 % (ref 11.5–15.5)
WBC: 9.5 10*3/uL (ref 4.0–10.5)
nRBC: 0 % (ref 0.0–0.2)

## 2021-06-20 LAB — COMPREHENSIVE METABOLIC PANEL
ALT: 123 U/L — ABNORMAL HIGH (ref 0–44)
AST: 71 U/L — ABNORMAL HIGH (ref 15–41)
Albumin: 3.3 g/dL — ABNORMAL LOW (ref 3.5–5.0)
Alkaline Phosphatase: 102 U/L (ref 38–126)
Anion gap: 12 (ref 5–15)
BUN: 39 mg/dL — ABNORMAL HIGH (ref 8–23)
CO2: 20 mmol/L — ABNORMAL LOW (ref 22–32)
Calcium: 8.4 mg/dL — ABNORMAL LOW (ref 8.9–10.3)
Chloride: 109 mmol/L (ref 98–111)
Creatinine, Ser: 1.99 mg/dL — ABNORMAL HIGH (ref 0.44–1.00)
GFR, Estimated: 25 mL/min — ABNORMAL LOW (ref >60)
Glucose, Bld: 123 mg/dL — ABNORMAL HIGH (ref 70–99)
Potassium: 4 mmol/L (ref 3.5–5.1)
Sodium: 141 mmol/L (ref 135–145)
Total Bilirubin: 1 mg/dL (ref 0.3–1.2)
Total Protein: 6.2 g/dL — ABNORMAL LOW (ref 6.5–8.1)

## 2021-06-20 LAB — BLOOD CULTURE ID PANEL (REFLEXED) - BCID2

## 2021-06-20 LAB — PROTIME-INR
INR: 1.3 — ABNORMAL HIGH (ref 0.8–1.2)
Prothrombin Time: 16.6 seconds — ABNORMAL HIGH (ref 11.4–15.2)

## 2021-06-20 LAB — ECHOCARDIOGRAM COMPLETE
Area-P 1/2: 4.8 cm2
Height: 65.5 in
S' Lateral: 3.5 cm
Weight: 2952.4 oz

## 2021-06-20 LAB — HEMOGLOBIN A1C
Hgb A1c MFr Bld: 6.4 % — ABNORMAL HIGH (ref 4.8–5.6)
Mean Plasma Glucose: 136.98 mg/dL

## 2021-06-20 LAB — LACTIC ACID, PLASMA
Lactic Acid, Venous: 1.2 mmol/L (ref 0.5–1.9)
Lactic Acid, Venous: 1 mmol/L (ref 0.5–1.9)

## 2021-06-20 LAB — MRSA NEXT GEN BY PCR, NASAL: MRSA by PCR Next Gen: NOT DETECTED

## 2021-06-20 SURGERY — CYSTOSCOPY, WITH RETROGRADE PYELOGRAM AND URETERAL STENT INSERTION
Anesthesia: General | Site: Ureter

## 2021-06-20 MED ORDER — SODIUM CHLORIDE 0.9 % IR SOLN
Status: DC | PRN
  Administered 2021-06-20: 3000 mL

## 2021-06-20 MED ORDER — POLYETHYLENE GLYCOL 3350 17 G PO PACK: 17.0000 g | PACK | Freq: Every day | ORAL | Status: DC | PRN

## 2021-06-20 MED ORDER — PANTOPRAZOLE SODIUM 40 MG PO TBEC
40.0000 mg | DELAYED_RELEASE_TABLET | Freq: Every day | ORAL | Status: DC
  Administered 2021-06-20 – 2021-06-22 (×3): 40 mg via ORAL
  Filled 2021-06-20 (×3): qty 1

## 2021-06-20 MED ORDER — DOCUSATE SODIUM 100 MG PO CAPS: 100.0000 mg | ORAL_CAPSULE | Freq: Every day | ORAL | Status: DC | PRN

## 2021-06-20 MED ORDER — METFORMIN HCL 500 MG PO TABS: 500.0000 mg | ORAL_TABLET | Freq: Every day | ORAL | Status: DC

## 2021-06-20 MED ORDER — LETROZOLE 2.5 MG PO TABS
2.5000 mg | ORAL_TABLET | Freq: Every day | ORAL | Status: DC
  Administered 2021-06-20 – 2021-06-22 (×3): 2.5 mg via ORAL
  Filled 2021-06-20 (×3): qty 1

## 2021-06-20 MED ORDER — SODIUM CHLORIDE 0.9 % IV SOLN: 250.0000 mL | INTRAVENOUS | Status: DC

## 2021-06-20 MED ORDER — NEBIVOLOL HCL 10 MG PO TABS
10.0000 mg | ORAL_TABLET | Freq: Every day | ORAL | Status: DC
  Administered 2021-06-20 – 2021-06-22 (×3): 10 mg via ORAL
  Filled 2021-06-20 (×3): qty 1

## 2021-06-20 MED ORDER — PROPOFOL 10 MG/ML IV BOLUS
INTRAVENOUS | Status: DC | PRN
  Administered 2021-06-20: 100 mg via INTRAVENOUS

## 2021-06-20 MED ORDER — ASCORBIC ACID 500 MG PO TABS
1000.0000 mg | ORAL_TABLET | Freq: Every day | ORAL | Status: DC
  Administered 2021-06-20 – 2021-06-22 (×3): 1000 mg via ORAL
  Filled 2021-06-20 (×3): qty 2

## 2021-06-20 MED ORDER — SODIUM CHLORIDE (PF) 0.9 % IJ SOLN
INTRAMUSCULAR | Status: DC | PRN
  Administered 2021-06-20: 9 mL

## 2021-06-20 MED ORDER — NITROGLYCERIN 0.4 MG SL SUBL
0.4000 mg | SUBLINGUAL_TABLET | SUBLINGUAL | Status: DC | PRN
Start: 2021-06-20 — End: 2021-06-22
  Administered 2021-06-20: 0.4 mg via SUBLINGUAL
  Filled 2021-06-20: qty 1

## 2021-06-20 MED ORDER — NOREPINEPHRINE 4 MG/250ML-% IV SOLN: 2.0000 ug/min | INTRAVENOUS | Status: DC

## 2021-06-20 MED ORDER — VITAMIN D 25 MCG (1000 UNIT) PO TABS
1000.0000 [IU] | ORAL_TABLET | Freq: Every day | ORAL | Status: DC
  Administered 2021-06-20 – 2021-06-22 (×3): 1000 [IU] via ORAL
  Filled 2021-06-20 (×3): qty 1

## 2021-06-20 MED ORDER — SODIUM CHLORIDE 0.9 % IV SOLN: INTRAVENOUS | Status: DC

## 2021-06-20 MED ORDER — ORAL CARE MOUTH RINSE
15.0000 mL | Freq: Two times a day (BID) | OROMUCOSAL | Status: DC
  Administered 2021-06-20 – 2021-06-21 (×5): 15 mL via OROMUCOSAL

## 2021-06-20 MED ORDER — ADULT MULTIVITAMIN W/MINERALS CH
1.0000 | ORAL_TABLET | Freq: Every evening | ORAL | Status: DC
  Administered 2021-06-20 – 2021-06-21 (×2): 1 via ORAL
  Filled 2021-06-20 (×2): qty 1

## 2021-06-20 MED ORDER — ASPIRIN EC 81 MG PO TBEC
81.0000 mg | DELAYED_RELEASE_TABLET | Freq: Every day | ORAL | Status: DC
  Administered 2021-06-20 – 2021-06-22 (×3): 81 mg via ORAL
  Filled 2021-06-20 (×3): qty 1

## 2021-06-20 MED ORDER — AMLODIPINE BESYLATE 10 MG PO TABS
10.0000 mg | ORAL_TABLET | Freq: Every day | ORAL | Status: DC
  Administered 2021-06-20 – 2021-06-22 (×3): 10 mg via ORAL
  Filled 2021-06-20 (×3): qty 1

## 2021-06-20 MED ORDER — CIPROFLOXACIN HCL 500 MG PO TABS
500.0000 mg | ORAL_TABLET | Freq: Every day | ORAL | Status: DC
  Administered 2021-06-20: 500 mg via ORAL

## 2021-06-20 MED ORDER — ISOSORBIDE MONONITRATE ER 60 MG PO TB24
90.0000 mg | ORAL_TABLET | Freq: Every day | ORAL | Status: DC
  Administered 2021-06-20: 90 mg via ORAL
  Filled 2021-06-20: qty 2

## 2021-06-20 MED ORDER — ONDANSETRON HCL 4 MG/2ML IJ SOLN: 4.0000 mg | Freq: Once | INTRAMUSCULAR | Status: DC | PRN

## 2021-06-20 MED ORDER — CALCIUM CARBONATE-VITAMIN D 500-200 MG-UNIT PO TABS
1.0000 | ORAL_TABLET | Freq: Every day | ORAL | Status: DC
  Administered 2021-06-20 – 2021-06-22 (×3): 1 via ORAL
  Filled 2021-06-20 (×3): qty 1

## 2021-06-20 MED ORDER — PROPOFOL 10 MG/ML IV BOLUS
INTRAVENOUS | Status: AC
  Filled 2021-06-20: qty 20

## 2021-06-20 MED ORDER — CALCIUM CARB-CHOLECALCIFEROL 600-500 MG-UNIT PO CAPS: 1.0000 | ORAL_CAPSULE | Freq: Every day | ORAL | Status: DC

## 2021-06-20 MED ORDER — ATORVASTATIN CALCIUM 40 MG PO TABS
80.0000 mg | ORAL_TABLET | Freq: Every day | ORAL | Status: DC
  Administered 2021-06-20 – 2021-06-22 (×3): 80 mg via ORAL
  Filled 2021-06-20 (×3): qty 2

## 2021-06-20 MED ORDER — FENTANYL CITRATE (PF) 100 MCG/2ML IJ SOLN
INTRAMUSCULAR | Status: AC
  Filled 2021-06-20: qty 2

## 2021-06-20 MED ORDER — ONDANSETRON HCL 4 MG/2ML IJ SOLN
INTRAMUSCULAR | Status: AC
  Filled 2021-06-20: qty 2

## 2021-06-20 MED ORDER — ONDANSETRON HCL 4 MG/2ML IJ SOLN: 4.0000 mg | INTRAMUSCULAR | Status: DC | PRN

## 2021-06-20 MED ORDER — HYDROCODONE-ACETAMINOPHEN 5-325 MG PO TABS
1.0000 | ORAL_TABLET | ORAL | Status: DC | PRN
  Administered 2021-06-20 – 2021-06-22 (×7): 1 via ORAL
  Filled 2021-06-20: qty 1
  Filled 2021-06-20: qty 2
  Filled 2021-06-20 (×5): qty 1

## 2021-06-20 MED ORDER — EYE WASH OPHTH SOLN: 1.0000 [drp] | OPHTHALMIC | Status: DC

## 2021-06-20 MED ORDER — LOSARTAN POTASSIUM 50 MG PO TABS
50.0000 mg | ORAL_TABLET | Freq: Every day | ORAL | Status: DC
  Administered 2021-06-20: 50 mg via ORAL
  Filled 2021-06-20: qty 1

## 2021-06-20 MED ORDER — ACETAMINOPHEN 10 MG/ML IV SOLN: 1000.0000 mg | Freq: Once | INTRAVENOUS | Status: DC | PRN

## 2021-06-20 MED ORDER — FERROUS GLUCONATE 324 (38 FE) MG PO TABS
324.0000 mg | ORAL_TABLET | Freq: Every day | ORAL | Status: DC
  Administered 2021-06-20 – 2021-06-21 (×2): 324 mg via ORAL
  Filled 2021-06-20 (×2): qty 1

## 2021-06-20 MED ORDER — LIDOCAINE HCL (PF) 2 % IJ SOLN
INTRAMUSCULAR | Status: AC
  Filled 2021-06-20: qty 5

## 2021-06-20 MED ORDER — GLUCOSAMINE-CHONDROITIN-MSM-D3 PO TABS: 1.0000 | ORAL_TABLET | Freq: Every day | ORAL | Status: DC

## 2021-06-20 MED ORDER — CHLORHEXIDINE GLUCONATE CLOTH 2 % EX PADS
6.0000 | MEDICATED_PAD | Freq: Every day | CUTANEOUS | Status: DC
  Administered 2021-06-20 – 2021-06-21 (×4): 6 via TOPICAL

## 2021-06-20 MED ORDER — FUROSEMIDE 10 MG/ML IJ SOLN
20.0000 mg | Freq: Once | INTRAMUSCULAR | Status: AC
  Administered 2021-06-20: 20 mg via INTRAVENOUS
  Filled 2021-06-20: qty 2

## 2021-06-20 MED ORDER — SUCCINYLCHOLINE CHLORIDE 200 MG/10ML IV SOSY
PREFILLED_SYRINGE | INTRAVENOUS | Status: AC
  Filled 2021-06-20: qty 10

## 2021-06-20 MED ORDER — DOCUSATE SODIUM 100 MG PO CAPS: 100.0000 mg | ORAL_CAPSULE | Freq: Two times a day (BID) | ORAL | Status: DC | PRN

## 2021-06-20 MED ORDER — HEPARIN SODIUM (PORCINE) 5000 UNIT/ML IJ SOLN
5000.0000 [IU] | Freq: Three times a day (TID) | INTRAMUSCULAR | Status: DC
  Administered 2021-06-20 – 2021-06-22 (×7): 5000 [IU] via SUBCUTANEOUS
  Filled 2021-06-20 (×7): qty 1

## 2021-06-20 MED ORDER — CIPROFLOXACIN HCL 500 MG PO TABS
500.0000 mg | ORAL_TABLET | Freq: Two times a day (BID) | ORAL | Status: DC
  Filled 2021-06-20: qty 1

## 2021-06-20 MED ORDER — SODIUM CHLORIDE 0.9 % IV SOLN
2.0000 g | INTRAVENOUS | Status: DC
  Administered 2021-06-20 – 2021-06-22 (×3): 2 g via INTRAVENOUS
  Filled 2021-06-20 (×3): qty 20

## 2021-06-20 MED ORDER — FENTANYL CITRATE (PF) 100 MCG/2ML IJ SOLN
INTRAMUSCULAR | Status: DC | PRN
  Administered 2021-06-20 (×4): 25 ug via INTRAVENOUS

## 2021-06-20 MED ORDER — FENTANYL CITRATE PF 50 MCG/ML IJ SOSY: 25.0000 ug | PREFILLED_SYRINGE | INTRAMUSCULAR | Status: DC | PRN

## 2021-06-20 MED ORDER — SODIUM CHLORIDE 0.9 % IV SOLN: 2.0000 g | INTRAVENOUS | Status: DC

## 2021-06-20 MED ORDER — LACTATED RINGERS IV SOLN: INTRAVENOUS | Status: DC

## 2021-06-20 MED ORDER — MORPHINE SULFATE (PF) 2 MG/ML IV SOLN: 2.0000 mg | INTRAVENOUS | Status: DC | PRN

## 2021-06-20 MED ORDER — ONDANSETRON HCL 4 MG/2ML IJ SOLN: 4.0000 mg | Freq: Four times a day (QID) | INTRAMUSCULAR | Status: DC | PRN

## 2021-06-20 MED ORDER — ONDANSETRON HCL 4 MG/2ML IJ SOLN
INTRAMUSCULAR | Status: DC | PRN
  Administered 2021-06-20: 4 mg via INTRAVENOUS

## 2021-06-20 MED ORDER — 0.9 % SODIUM CHLORIDE (POUR BTL) OPTIME
TOPICAL | Status: DC | PRN
  Administered 2021-06-20: 1000 mL

## 2021-06-20 MED ORDER — SUCCINYLCHOLINE CHLORIDE 200 MG/10ML IV SOSY
PREFILLED_SYRINGE | INTRAVENOUS | Status: DC | PRN
  Administered 2021-06-20: 100 mg via INTRAVENOUS

## 2021-06-20 MED ORDER — CLOPIDOGREL BISULFATE 75 MG PO TABS
75.0000 mg | ORAL_TABLET | Freq: Every day | ORAL | Status: DC
  Administered 2021-06-20 – 2021-06-22 (×3): 75 mg via ORAL
  Filled 2021-06-20 (×3): qty 1

## 2021-06-20 MED ORDER — LIDOCAINE 2% (20 MG/ML) 5 ML SYRINGE
INTRAMUSCULAR | Status: DC | PRN
  Administered 2021-06-20: 100 mg via INTRAVENOUS

## 2021-06-20 MED ORDER — INSULIN ASPART 100 UNIT/ML IJ SOLN
0.0000 [IU] | Freq: Three times a day (TID) | INTRAMUSCULAR | Status: DC
  Administered 2021-06-21: 2 [IU] via SUBCUTANEOUS
  Administered 2021-06-22: 3 [IU] via SUBCUTANEOUS

## 2021-06-20 MED ORDER — ACETAMINOPHEN 500 MG PO TABS: 500.0000 mg | ORAL_TABLET | Freq: Four times a day (QID) | ORAL | Status: DC | PRN

## 2021-06-20 MED ORDER — NITROGLYCERIN 0.4 MG/SPRAY TL SOLN
1.0000 | Status: DC | PRN
  Filled 2021-06-20: qty 12

## 2021-06-20 SURGICAL SUPPLY — 17 items
BAG URO CATCHER STRL LF (MISCELLANEOUS) ×3 IMPLANT
CATH INTERMIT  6FR 70CM (CATHETERS) ×3 IMPLANT
CLOTH BEACON ORANGE TIMEOUT ST (SAFETY) ×3 IMPLANT
GLOVE SURG ENC MOIS LTX SZ6.5 (GLOVE) ×3 IMPLANT
GLOVE SURG ENC MOIS LTX SZ7.5 (GLOVE) ×3 IMPLANT
GLOVE SURG ENC TEXT LTX SZ7.5 (GLOVE) ×3 IMPLANT
GLOVE SURG LTX SZ7 (GLOVE) ×3 IMPLANT
GOWN STRL REUS W/ TWL LRG LVL3 (GOWN DISPOSABLE) ×2 IMPLANT
GOWN STRL REUS W/TWL LRG LVL3 (GOWN DISPOSABLE) ×1
GOWN STRL REUS W/TWL XL LVL3 (GOWN DISPOSABLE) ×6 IMPLANT
GUIDEWIRE STR DUAL SENSOR (WIRE) ×3 IMPLANT
KIT TURNOVER KIT A (KITS) ×3 IMPLANT
MANIFOLD NEPTUNE II (INSTRUMENTS) ×3 IMPLANT
PACK CYSTO (CUSTOM PROCEDURE TRAY) ×3 IMPLANT
STENT URET 6FRX26 CONTOUR (STENTS) ×3 IMPLANT
TRAY FOLEY MTR SLVR 14FR STAT (SET/KITS/TRAYS/PACK) ×3 IMPLANT
TUBING CONNECTING 10 (TUBING) ×3 IMPLANT

## 2021-06-20 NOTE — Op Note (Signed)
Preoperative diagnosis: Left ureteral stone and sepsis Postoperative diagnosis: Left ureteral stone and sepsis Surgery: Cystoscopy left retrograde ureterogram and insertion of left ureteral stent Surgeon: Dr. Nicki Reaper Viki Carrera  The patient is above diagnosis and consented the above procedure.  Extra care was taken leg positioning.  Preoperative antibiotics were given.  31 French cystoscope was utilized.  Bladder mucosa and trigone were normal.  Urine was a bit cloudy.  Under fluoroscopic guidance and cystoscopic guidance I passed a sensor wire to the upper left ureter.  5 [place] open-ended ureteral catheter was passed to that height removing the wire.  I did a gentle retrograde ureterogram but needed 6 to 7 cc of contrast because of the obstructing stone.  I could see moderate hydronephrosis.  I then passed a sensor wire curling in the upper pole calyx.  I remove the open-ended ureteral catheter.  I passed a 26 cm x 6 mm double-J stent curling in the left renal pelvis and the bladder.  I was happy with the position.  Bladder was emptied.  Foley catheter inserted.  Patient was taken to recovery

## 2021-06-20 NOTE — Progress Notes (Signed)
PHARMACY NOTE:  ANTIMICROBIAL RENAL DOSAGE ADJUSTMENT  Current antimicrobial regimen includes a mismatch between antimicrobial dosage and estimated renal function.  As per policy approved by the Pharmacy & Therapeutics and Medical Executive Committees, the antimicrobial dosage will be adjusted accordingly.  Current antimicrobial dosage:  cipro 500 po bid  Indication: UTI  Renal Function:  Estimated Creatinine Clearance: 25.2 mL/min (A) (by C-G formula based on SCr of 1.99 mg/dL (H)). []      On intermittent HD, scheduled: []      On CRRT    Antimicrobial dosage has been changed to:  cipro 500mg  qday  Additional comments:   Thank you for allowing pharmacy to be a part of this patient's care.  Eudelia Bunch, Pharm.D 06/20/2021 7:50 AM

## 2021-06-20 NOTE — Plan of Care (Signed)

## 2021-06-20 NOTE — Progress Notes (Signed)
Date and time results received: 06/20/21 1030 (use smartphrase ".now" to insert current time)  Test: Blood cultures Critical Value: GRAM NEGATIVE RODS  ANAEROBIC BOTTLE ONLY  Name of Provider Notified: Jennelle Human, CCM, NP   Orders Received? Or Actions Taken?: Pt on antibiotics, Pharmacist made aware as well.

## 2021-06-20 NOTE — Anesthesia Procedure Notes (Signed)
Procedure Name: Intubation Date/Time: 06/20/2021 2:53 AM Performed by: [place] Cara Aguino C, CRNA Pre-anesthesia Checklist: Patient identified, Emergency Drugs available, Suction available and Patient being monitored Patient Re-evaluated:Patient Re-evaluated prior to induction Oxygen Delivery Method: Circle system utilized Preoxygenation: Pre-oxygenation with 100% oxygen Induction Type: IV induction and Rapid sequence Laryngoscope Size: Mac and 3 Grade View: Grade I Tube type: Oral Tube size: 7.0 mm Number of attempts: 1 Airway Equipment and Method: Stylet and Oral airway Placement Confirmation: ETT inserted through vocal cords under direct vision, positive ETCO2 and breath sounds checked- equal and bilateral Tube secured with: Tape Dental Injury: Teeth and Oropharynx as per pre-operative assessment

## 2021-06-20 NOTE — Transfer of Care (Signed)
Immediate Anesthesia Transfer of Care Note  Patient: Kimberly Flowers  Procedure(s) Performed: CYSTOSCOPY WITH RETROGRADE PYELOGRAM/URETERAL STENT PLACEMENT (Left: Ureter)  Patient Location: PACU  Anesthesia Type:General  Level of Consciousness: awake, alert  and oriented  Airway & Oxygen Therapy: Patient Spontanous Breathing and Patient connected to face mask oxygen  Post-op Assessment: Report given to RN and Post -op Vital signs reviewed and stable  Post vital signs: Reviewed and stable  Last Vitals:  Vitals Value Taken Time  BP 121/61 06/20/21 0322  Temp    Pulse 77 06/20/21 0323  Resp 22 06/20/21 0323  SpO2 95 % 06/20/21 0323  Vitals shown include unvalidated device data.  Last Pain:  Vitals:   06/20/21 0115  TempSrc: Oral  PainSc: 4       Patients Stated Pain Goal: 0 (40/98/11 9147)  Complications: No notable events documented.

## 2021-06-20 NOTE — Anesthesia Preprocedure Evaluation (Addendum)
Anesthesia Evaluation  Patient identified by MRN, date of birth, ID band Patient awake    Reviewed: Allergy & Precautions, NPO status , Patient's Chart, lab work & pertinent test results  Airway Mallampati: II  TM Distance: >3 FB Neck ROM: Full    Dental no notable dental hx.    Pulmonary neg pulmonary ROS,    Pulmonary exam normal breath sounds clear to auscultation       Cardiovascular hypertension, + CAD and + CABG  Normal cardiovascular exam Rhythm:Regular Rate:Normal     Neuro/Psych negative neurological ROS  negative psych ROS   GI/Hepatic negative GI ROS, Neg liver ROS,   Endo/Other  diabetes, Type 2  Renal/GU Renal InsufficiencyRenal disease  negative genitourinary   Musculoskeletal negative musculoskeletal ROS (+)   Abdominal   Peds negative pediatric ROS (+)  Hematology negative hematology ROS (+)   Anesthesia Other Findings   Reproductive/Obstetrics negative OB ROS                             Anesthesia Physical Anesthesia Plan  ASA: 3 and emergent  Anesthesia Plan: General   Post-op Pain Management:    Induction: Intravenous and Rapid sequence  PONV Risk Score and Plan: 3 and Ondansetron, Dexamethasone and Treatment may vary due to age or medical condition  Airway Management Planned: Oral ETT  Additional Equipment:   Intra-op Plan:   Post-operative Plan: Extubation in OR  Informed Consent: I have reviewed the patients History and Physical, chart, labs and discussed the procedure including the risks, benefits and alternatives for the proposed anesthesia with the patient or authorized representative who has indicated his/her understanding and acceptance.     Dental advisory given  Plan Discussed with: CRNA and Surgeon  Anesthesia Plan Comments:        Anesthesia Quick Evaluation

## 2021-06-20 NOTE — Progress Notes (Signed)
  Echocardiogram 2D Echocardiogram has been performed.  Kimberly Flowers 06/20/2021, 4:11 PM

## 2021-06-20 NOTE — Progress Notes (Signed)
Blood culture positive Medical management See Dr Felipa Eth [facility] [place] in next two weeks- name given Sign off

## 2021-06-20 NOTE — Progress Notes (Signed)
NAME:  Kimberly Flowers, MRN:  [id number], DOB:  1941/03/22, LOS: 1 ADMISSION DATE:  06/19/2021, CONSULTATION DATE:  06/20/2021 REFERRING MD:  Almyra Free - ED AP, CHIEF COMPLAINT:  septic shock   History of Present Illness:  80 year old woman who is being transferred to [facility] for management of septic shock due to left pyelonephritis related to renal stone.   She developed left flank pain, nausea and vomiting and chills on 10/11. She was seen by urology as outpatient who recommended hospitalization and surgical management as she was hypotensive in Dr Carlyle Lipa office.   Significant CVD history with prior CABG  - last cath 10/2020 - PCI LADm - DAPT indefinitely.  EF normal as of 2019   Also hx of invasive ductal carcinoma of right breast, grade 1, ER+, PR+, HER2 - on letrozole and plans to start radiation therapy 10/11, f/u at [facility]  Pertinent  Medical History   Past Medical History:  Diagnosis Date   Anemia    Coronary atherosclerosis of native coronary artery    Previous PCI 2007-2008, Dr. Sharyon Cable   Essential hypertension, benign    History of kidney stones    Mixed hyperlipidemia    PAD (peripheral artery disease) ([place])     Absent right DP   Palpitations    Type 2 diabetes mellitus ([place])    Vasomotor rhinitis    Significant Hospital Events: Including procedures, antibiotic start and stop dates in addition to other pertinent events   10/12 seen AP - received 2L fluids and started on NE.  10/12 CT shows stone obstructing L ureteropelvic junction. Renal cyst. 70m pulmonary nodule.  [facility]p [facility]oscopy and left ureteral stent placement, off pressors   Interim History / Subjective:  Remains off pressors.  Some ongoing left flank pain that wraps around to abd.  Up in bedside chair eating breakfast.   Objective   Blood pressure (!) 144/54, pulse 68, temperature 98.7 F (37.1 C), temperature source Oral, resp. rate (!) 30, height 5' 5.5" (1.664 m), weight 83.7 kg,  SpO2 91 %.        Intake/Output Summary (Last 24 hours) at 06/20/2021 0815 Last data filed at 06/20/2021 0600 Gross per 24 hour  Intake 3036.76 ml  Output 350 ml  Net 2686.76 ml    Filed Weights   06/19/21 1639 06/20/21 0115  Weight: 81.6 kg 83.7 kg    Examination: General:  older adult female sitting in bedside recliner in NAD HEENT: MM pink/moist Neuro:  A/O, MAE CV: rr ir, no murmur PULM:  non labored, clear anteriorly, bibasilar rales  GI: soft, bs+, foley -darker amber urine Extremities: warm/dry, no LE edema  Skin: no rashes   UOP 5574mthus far since foley placement in OR Net +2.6L   10/12 Bcx 2 > 1/4 GNR >>  Resolved Hospital Problem list   N/a  Assessment & Plan:   Critically ill due to septic shock caused by pyelonephritis due to left obstructing renal calculus- improving   AKI secondary to hypoperfusion secondary to septic shock as above and obstructive renal calculus  - Appreciate Urology assistance - S/p cystoscopy left retrograde ureterogram and left ureteral stent placement this am  - off vasopressors overnight, lactate cleared - goal MAP > 65 - foley per urology  - strict I/O - echo pending - follow cultures  - continue ceftriaxone  - Trend BMP / urinary output - Replace electrolytes as indicated - Avoid nephrotoxic agents, ensure adequate renal perfusion  Coronary artery disease Peripheral artery disease Dyslipidemia HTN - resume home plavix/ ASA/ Lipitor  - resume home norvasc, imdur, and bystolic  - some irregularly on tele, ?afib at times vs SR with PACs.  Obtain EKG now - holding losartan given AKI - lasix 82m given increased BNP/ net +2.6L and O2 needs, currently on RA at 90%  Type 2 DM - cont home metformin  - trend on BMET  Transaminitis  - repeat in am, if elevated consider acute hepatis panel and RUQ [place]- no obvious abnormality on CT  RML nodule 417m- on chart review, appears this is new.  Given her active breast ca,  will need further outpatient follow-up/ workup.  She is a never smoker  Right breast cancer - on letrozole and starting radiation.  F/b [facility] - continue outpatient f/u - continue letrozole   Best Practice (right click and "Reselect all SmartList Selections" daily)   Diet/type: Regular consistency (see orders) DVT prophylaxis: prophylactic heparin  and DAPT GI prophylaxis: N/A Lines: N/A Foley:  Yes, and it is still needed Code Status:  full code Last date of multidisciplinary goals of care discussion [pending]  As she has been hemodynamically stable, will transfer to PCU and will have [place] medical care on 10/14, PCCM will be available as needed.    Labs   CBC: Recent Labs  Lab 06/19/21 1809 06/20/21 0230  WBC 19.2* 9.5  NEUTROABS 18.6* 8.7*  HGB 13.7 13.0  HCT 41.9 38.1  MCV 95.2 91.6  PLT 98* 126*     Basic Metabolic Panel: Recent Labs  Lab 06/19/21 1809 06/20/21 0230  NA 133* 141  K 4.5 4.0  CL 103 109  CO2 17* 20*  GLUCOSE 142* 123*  BUN 44* 39*  CREATININE 2.65* 1.99*  CALCIUM 8.2* 8.4*  MG 1.8  --     GFR: Estimated Creatinine Clearance: 25.2 mL/min (A) (by C-G formula based on SCr of 1.99 mg/dL (H)). Recent Labs  Lab 06/19/21 1809 06/19/21 2022 06/20/21 0230 06/20/21 0515  WBC 19.2*  --  9.5  --   LATICACIDVEN 2.7* 2.0* 1.2 1.0     Liver Function Tests: Recent Labs  Lab 06/20/21 0230  AST 71*  ALT 123*  ALKPHOS 102  BILITOT 1.0  PROT 6.2*  ALBUMIN 3.3*   No results for input(s): LIPASE, AMYLASE in the last 168 hours. No results for input(s): AMMONIA in the last 168 hours.  ABG No results found for: PHART, PCO2ART, PO2ART, HCO3, TCO2, ACIDBASEDEF, O2SAT   Coagulation Profile: Recent Labs  Lab 06/20/21 0230  INR 1.3*    Cardiac Enzymes: No results for input(s): CKTOTAL, CKMB, CKMBINDEX, TROPONINI in the last 168 hours.  HbA1C: Hgb A1c MFr Bld  Date/Time Value Ref Range Status  04/11/2021 02:40 PM 6.7 (H) 4.8 - 5.6 %  Final    Comment:    (NOTE) Pre diabetes:          5.7%-6.4%  Diabetes:              >6.4%  Glycemic control for   <7.0% adults with diabetes   08/17/2015 03:54 AM 6.3 (H) 4.8 - 5.6 % Final    Comment:    (NOTE)         Pre-diabetes: 5.7 - 6.4         Diabetes: >6.4         Glycemic control for adults with diabetes: <7.0     CBG: Recent Labs  Lab 06/20/21  0118 06/20/21 0325 06/20/21 Lane, ACNP [facility] 06/20/2021, 8:15 AM  See Amion for pager If no response to pager, please call PCCM consult pager After 7:00 pm call Elink

## 2021-06-20 NOTE — Consult Note (Signed)
Urology Consult  Referring physician: Oris Drone Reason for referral: ureter stone and sepsis   Chief Complaint: ureter stone and sepsis  History of Present Illness: Seen by urology with stone and hypotension and sent to ER. Left sided pain and n/v. 9 mm left UPJ stone. On plavix. Cardiac history. Near syncope. No fever. On cipro. Placed on rocephin and given fluids. Cultures sent  Passed stone years agon NO GU surgery hx No UTI hx  Chills two days ago with pain  WBC 19.2 Cr 2.65 Lactic acid 2  CT scan: 9x5 mm stone left UPJX with stranding and moderate hydro   Past Medical History:  Diagnosis Date   Anemia    Coronary atherosclerosis of native coronary artery    Previous PCI 2007-2008, Dr. Sharyon Cable   Essential hypertension, benign    History of kidney stones    Mixed hyperlipidemia    PAD (peripheral artery disease) ([place])     Absent right DP   Palpitations    Type 2 diabetes mellitus ([place])    Vasomotor rhinitis    Past Surgical History:  Procedure Laterality Date   ABDOMINAL HYSTERECTOMY     APPENDECTOMY     BREAST LUMPECTOMY WITH RADIOACTIVE SEED LOCALIZATION Right 04/16/2021   Procedure: RIGHT BREAST LUMPECTOMY WITH RADIOACTIVE SEED LOCALIZATION;  Surgeon: Stark Klein, MD;  Location: [facility];  Service: General;  Laterality: Right;   BREAST SURGERY     CARDIAC CATHETERIZATION N/A 08/17/2015   Procedure: Left Heart Cath and Coronary Angiography;  Surgeon: Burnell Blanks, MD;  Location: [facility];  Service: Cardiovascular;  Laterality: N/A;   CHOLECYSTECTOMY     CORONARY ARTERY BYPASS GRAFT  2006   [name], single vessel   CORONARY CTO INTERVENTION N/A 08/15/2020   Procedure: CORONARY CTO INTERVENTION;  Surgeon: [name], [name], MD;  Location: [facility];  Service: Cardiovascular;  Laterality: N/A;   CORONARY STENT INTERVENTION N/A 08/15/2020   Procedure: CORONARY STENT INTERVENTION;  Surgeon: [name], [name], MD;  Location: [facility];   Service: Cardiovascular;  Laterality: N/A;   CORONARY STENT INTERVENTION N/A 10/18/2020   Procedure: CORONARY STENT INTERVENTION;  Surgeon: [name], [name], MD;  Location: [facility];  Service: Cardiovascular;  Laterality: N/A;   CORONARY STENT INTERVENTION  10/18/2020   INTRAVASCULAR ULTRASOUND/IVUS N/A 08/15/2020   Procedure: Intravascular Ultrasound/IVUS;  Surgeon: [name], [name], MD;  Location: [facility];  Service: Cardiovascular;  Laterality: N/A;   INTRAVASCULAR ULTRASOUND/IVUS N/A 10/18/2020   Procedure: Intravascular Ultrasound/IVUS;  Surgeon: [name], [name], MD;  Location: [facility];  Service: Cardiovascular;  Laterality: N/A;   LEFT HEART CATH AND CORONARY ANGIOGRAPHY N/A 07/10/2020   Procedure: LEFT HEART CATH AND CORONARY ANGIOGRAPHY;  Surgeon: [name], MD;  Location: [facility] [facility];  Service: Cardiovascular;  Laterality: N/A;    Medications: I have reviewed the patient's current medications. Allergies:  Allergies  Allergen Reactions   Ranolazine Er Other (See Comments)    Dizziness    Sulfonamide Derivatives     Unknown    Family History  Problem Relation Age of Onset   Cancer Sister        Breast   CAD Other        Family history   Social History:  reports that she has never smoked. She has never used smokeless tobacco. She reports that she does not drink alcohol and does not use drugs.  ROS: All systems are  reviewed and negative except as noted. Rest negative  Physical Exam:  Vital signs in last 24 hours: Temp:  [97.5 F (36.4 C)-98.6 F (37 C)] 98.6 F (37 C) (10/13 0115) Pulse Rate:  [60-91] 91 (10/13 0145) Resp:  [15-37] 23 (10/13 0145) BP: (72-144)/(45-72) 140/70 (10/13 0145) SpO2:  [91 %-95 %] 95 % (10/13 0145) Weight:  [81.6 kg-83.7 kg] 83.7 kg (10/13 0115)  Cardiovascular: Skin warm; not flushed Respiratory: Breaths quiet; no shortness of breath Abdomen: No masses Neurological: Normal sensation to  touch Musculoskeletal: Normal motor function arms and legs Lymphatics: No inguinal adenopathy Skin: No rashes Genitourinary:rest negative  Laboratory Data:  Results for orders placed or performed during the hospital encounter of 06/19/21 (from the past 72 hour(s))  CBC with Differential     Status: Abnormal   Collection Time: 06/19/21  6:09 PM  Result Value Ref Range   WBC 19.2 (H) 4.0 - 10.5 K/uL   RBC 4.40 3.87 - 5.11 MIL/uL   Hemoglobin 13.7 12.0 - 15.0 g/dL   HCT 41.9 36.0 - 46.0 %   MCV 95.2 80.0 - 100.0 fL   MCH 31.1 26.0 - 34.0 pg   MCHC 32.7 30.0 - 36.0 g/dL   RDW 13.8 11.5 - 15.5 %   Platelets 98 (L) 150 - 400 K/uL    Comment: SPECIMEN CHECKED FOR CLOTS REPEATED TO VERIFY PLATELET COUNT CONFIRMED BY SMEAR    nRBC 0.0 0.0 - 0.2 %   Neutrophils Relative % 92 %   Neutro Abs 18.6 (H) 1.7 - 7.7 K/uL   Band Neutrophils 5 %   Lymphocytes Relative 1 %   Lymphs Abs 0.2 (L) 0.7 - 4.0 K/uL   Monocytes Relative 2 %   Monocytes Absolute 0.4 0.1 - 1.0 K/uL   Eosinophils Relative 0 %   Eosinophils Absolute 0.0 0.0 - 0.5 K/uL   Basophils Relative 0 %   Basophils Absolute 0.0 0.0 - 0.1 K/uL   RBC Morphology MORPHOLOGY UNREMARKABLE     Comment: Performed at [facility], [address] [place], [place] [address]  Basic metabolic panel     Status: Abnormal   Collection Time: 06/19/21  6:09 PM  Result Value Ref Range   Sodium 133 (L) 135 - 145 mmol/L   Potassium 4.5 3.5 - 5.1 mmol/L   Chloride 103 98 - 111 mmol/L   CO2 17 (L) 22 - 32 mmol/L   Glucose, Bld 142 (H) 70 - 99 mg/dL    Comment: Glucose reference range applies only to samples taken after fasting for at least 8 hours.   BUN 44 (H) 8 - 23 mg/dL   Creatinine, Ser 2.65 (H) 0.44 - 1.00 mg/dL   Calcium 8.2 (L) 8.9 - 10.3 mg/dL   GFR, Estimated 18 (L) >60 mL/min    Comment: (NOTE) Calculated using the CKD-EPI Creatinine Equation (2021)    Anion gap 13 5 - 15    Comment: Performed at [facility], [address],  [place], [place] [address]  Lactic acid, plasma     Status: Abnormal   Collection Time: 06/19/21  6:09 PM  Result Value Ref Range   Lactic Acid, Venous 2.7 (HH) 0.5 - 1.9 mmol/L    Comment: CRITICAL RESULT CALLED TO, READ BACK BY AND VERIFIED WITH: SITE,K ON 06/19/21 AT 1840 BY LOY,C Performed at [facility], [address], [place], [place] [address]   Troponin I (High Sensitivity)     Status: None   Collection Time: 06/19/21  6:09  PM  Result Value Ref Range   Troponin I (High Sensitivity) 13 <18 ng/L    Comment: (NOTE) Elevated high sensitivity troponin I (hsTnI) values and significant  changes across serial measurements may suggest ACS but many other  chronic and acute conditions are known to elevate hsTnI results.  Refer to the "Links" section for chest pain algorithms and additional  guidance. Performed at [facility], [address], [place], [place] [address]   Brain natriuretic peptide     Status: Abnormal   Collection Time: 06/19/21  6:09 PM  Result Value Ref Range   B Natriuretic Peptide 1,682.0 (H) 0.0 - 100.0 pg/mL    Comment: Performed at [facility], [address], [place], [place] [address]  Culture, blood (routine x 2)     Status: None (Preliminary result)   Collection Time: 06/19/21  6:09 PM   Specimen: Left Antecubital; Blood  Result Value Ref Range   Specimen Description LEFT ANTECUBITAL    Special Requests      Blood Culture results may not be optimal due to an excessive volume of blood received in culture bottles BOTTLES DRAWN AEROBIC AND ANAEROBIC Performed at [facility], [address] [place], [place] [address]    Culture PENDING    Report Status PENDING   Culture, blood (routine x 2)     Status: None (Preliminary result)   Collection Time: 06/19/21  6:09 PM   Specimen: BLOOD LEFT HAND  Result Value Ref Range   Specimen Description BLOOD LEFT HAND    Special Requests      Blood Culture adequate volume BOTTLES DRAWN AEROBIC AND ANAEROBIC Performed at  [facility], [address], [place], [place] [address]    Culture PENDING    Report Status PENDING   Magnesium     Status: None   Collection Time: 06/19/21  6:09 PM  Result Value Ref Range   Magnesium 1.8 1.7 - 2.4 mg/dL    Comment: Performed at [facility], [address] [place], [place] [address]  D-dimer, quantitative     Status: Abnormal   Collection Time: 06/19/21  6:09 PM  Result Value Ref Range   D-Dimer, Quant 4.92 (H) 0.00 - 0.50 ug/mL-FEU    Comment: (NOTE) At the manufacturer cut-off value of 0.5 g/mL FEU, this assay has a negative predictive value of 95-100%.This assay is intended for use in conjunction with a clinical pretest probability (PTP) assessment model to exclude pulmonary embolism (PE) and deep venous thrombosis (DVT) in outpatients suspected of PE or DVT. Results should be correlated with clinical presentation. Performed at [facility], [address], [place],  [address]   Resp Panel by RT-PCR (Flu A&B, Covid) Nasopharyngeal Swab     Status: None   Collection Time: 06/19/21  6:16 PM   Specimen: Nasopharyngeal Swab; Nasopharyngeal(NP) swabs in vial transport medium  Result Value Ref Range   SARS Coronavirus 2 by RT PCR NEGATIVE NEGATIVE    Comment: (NOTE) SARS-CoV-2 target nucleic acids are NOT DETECTED.  The SARS-CoV-2 RNA is generally detectable in upper respiratory specimens during the acute phase of infection. The lowest concentration of SARS-CoV-2 viral copies this assay can detect is 138 copies/mL. A negative result does not preclude SARS-Cov-2 infection and should not be used as the sole basis for treatment or other patient management decisions. A negative result may occur with  improper specimen collection/handling, submission of specimen other than nasopharyngeal swab, presence of viral mutation(s) within the areas targeted by this assay, and  inadequate number of viral copies(<138 copies/mL). A negative result must be combined  with clinical observations, patient history, and epidemiological information. The expected result is Negative.  Fact Sheet for Patients:  EntrepreneurPulse.com.au  Fact Sheet for Healthcare Providers:  IncredibleEmployment.be  This test is no t yet approved or cleared by the [place] FDA and  has been authorized for detection and/or diagnosis of SARS-CoV-2 by FDA under an Emergency Use Authorization (EUA). This EUA will remain  in effect (meaning this test can be used) for the duration of the COVID-19 declaration under Section 564(b)(1) of the Act, 21 U.S.C.section 360bbb-3(b)(1), unless the authorization is terminated  or revoked sooner.       Influenza A by PCR NEGATIVE NEGATIVE   Influenza B by PCR NEGATIVE NEGATIVE    Comment: (NOTE) The Xpert Xpress SARS-CoV-2/FLU/RSV plus assay is intended as an aid in the diagnosis of influenza from Nasopharyngeal swab specimens and should not be used as a sole basis for treatment. Nasal washings and aspirates are unacceptable for Xpert Xpress SARS-CoV-2/FLU/RSV testing.  Fact Sheet for Patients: EntrepreneurPulse.com.au  Fact Sheet for Healthcare Providers: IncredibleEmployment.be  This test is not yet approved or cleared by the [place] FDA and has been authorized for detection and/or diagnosis of SARS-CoV-2 by FDA under an Emergency Use Authorization (EUA). This EUA will remain in effect (meaning this test can be used) for the duration of the COVID-19 declaration under Section 564(b)(1) of the Act, 21 U.S.C. section 360bbb-3(b)(1), unless the authorization is terminated or revoked.  Performed at [facility], [address] [place], [place] [address]   Lactic acid, plasma     Status: Abnormal   Collection Time: 06/19/21  8:22 PM  Result Value Ref Range   Lactic Acid, Venous 2.0 (HH) 0.5 - 1.9 mmol/L    Comment: CRITICAL VALUE NOTED.  VALUE IS  CONSISTENT WITH PREVIOUSLY REPORTED AND CALLED VALUE. Performed at [facility], [address] [place], [place] [address]   Troponin I (High Sensitivity)     Status: None   Collection Time: 06/19/21  8:22 PM  Result Value Ref Range   Troponin I (High Sensitivity) 10 <18 ng/L    Comment: (NOTE) Elevated high sensitivity troponin I (hsTnI) values and significant  changes across serial measurements may suggest ACS but many other  chronic and acute conditions are known to elevate hsTnI results.  Refer to the "Links" section for chest pain algorithms and additional  guidance. Performed at [facility], [address], [place], [place] [address]   Urinalysis, Routine w reflex microscopic Urine, Catheterized     Status: Abnormal   Collection Time: 06/19/21  9:30 PM  Result Value Ref Range   Color, Urine YELLOW YELLOW   APPearance HAZY (A) CLEAR   Specific Gravity, Urine 1.016 1.005 - 1.030   pH 5.0 5.0 - 8.0   Glucose, UA NEGATIVE NEGATIVE mg/dL   Hgb urine dipstick LARGE (A) NEGATIVE   Bilirubin Urine NEGATIVE NEGATIVE   Ketones, ur NEGATIVE NEGATIVE mg/dL   Protein, ur 30 (A) NEGATIVE mg/dL   Nitrite NEGATIVE NEGATIVE   Leukocytes,Ua MODERATE (A) NEGATIVE   RBC / HPF 21-50 0 - 5 RBC/hpf   WBC, UA 21-50 0 - 5 WBC/hpf   Bacteria, UA MANY (A) NONE SEEN   Uric Acid Crys, UA PRESENT     Comment: Performed at [facility], [address] [place], [place] [address]  Glucose, capillary     Status: Abnormal   Collection Time: 06/20/21  1:18 AM  Result Value Ref Range   Glucose-Capillary 124 (H) 70 - 99 mg/dL    Comment: Glucose reference range applies only to samples taken after fasting for at least 8 hours.   Recent Results (from the past 240 hour(s))  Culture, blood (routine x 2)     Status: None (Preliminary result)   Collection Time: 06/19/21  6:09 PM   Specimen: Left Antecubital; Blood  Result Value Ref Range Status   Specimen Description LEFT ANTECUBITAL  Final   Special  Requests   Final    Blood Culture results may not be optimal due to an excessive volume of blood received in culture bottles BOTTLES DRAWN AEROBIC AND ANAEROBIC Performed at [facility], [address], [place], [place] [address]    Culture PENDING  Incomplete   Report Status PENDING  Incomplete  Culture, blood (routine x 2)     Status: None (Preliminary result)   Collection Time: 06/19/21  6:09 PM   Specimen: BLOOD LEFT HAND  Result Value Ref Range Status   Specimen Description BLOOD LEFT HAND  Final   Special Requests   Final    Blood Culture adequate volume BOTTLES DRAWN AEROBIC AND ANAEROBIC Performed at [facility], [address] [place], [place] [address]    Culture PENDING  Incomplete   Report Status PENDING  Incomplete  Resp Panel by RT-PCR (Flu A&B, Covid) Nasopharyngeal Swab     Status: None   Collection Time: 06/19/21  6:16 PM   Specimen: Nasopharyngeal Swab; Nasopharyngeal(NP) swabs in vial transport medium  Result Value Ref Range Status   SARS Coronavirus 2 by RT PCR NEGATIVE NEGATIVE Final    Comment: (NOTE) SARS-CoV-2 target nucleic acids are NOT DETECTED.  The SARS-CoV-2 RNA is generally detectable in upper respiratory specimens during the acute phase of infection. The lowest concentration of SARS-CoV-2 viral copies this assay can detect is 138 copies/mL. A negative result does not preclude SARS-Cov-2 infection and should not be used as the sole basis for treatment or other patient management decisions. A negative result may occur with  improper specimen collection/handling, submission of specimen other than nasopharyngeal swab, presence of viral mutation(s) within the areas targeted by this assay, and inadequate number of viral copies(<138 copies/mL). A negative result must be combined with clinical observations, patient history, and epidemiological information. The expected result is Negative.  Fact Sheet for Patients:   EntrepreneurPulse.com.au  Fact Sheet for Healthcare Providers:  IncredibleEmployment.be  This test is no t yet approved or cleared by the [place] FDA and  has been authorized for detection and/or diagnosis of SARS-CoV-2 by FDA under an Emergency Use Authorization (EUA). This EUA will remain  in effect (meaning this test can be used) for the duration of the COVID-19 declaration under Section 564(b)(1) of the Act, 21 U.S.C.section 360bbb-3(b)(1), unless the authorization is terminated  or revoked sooner.       Influenza A by PCR NEGATIVE NEGATIVE Final   Influenza B by PCR NEGATIVE NEGATIVE Final    Comment: (NOTE) The Xpert Xpress SARS-CoV-2/FLU/RSV plus assay is intended as an aid in the diagnosis of influenza from Nasopharyngeal swab specimens and should not be used as a sole basis for treatment. Nasal washings and aspirates are unacceptable for Xpert Xpress SARS-CoV-2/FLU/RSV testing.  Fact Sheet for Patients: EntrepreneurPulse.com.au  Fact Sheet for Healthcare Providers: IncredibleEmployment.be  This test is not yet approved or cleared by the [place] FDA and has been authorized for detection and/or diagnosis of SARS-CoV-2 by  FDA under an Emergency Use Authorization (EUA). This EUA will remain in effect (meaning this test can be used) for the duration of the COVID-19 declaration under Section 564(b)(1) of the Act, 21 U.S.C. section 360bbb-3(b)(1), unless the authorization is terminated or revoked.  Performed at [facility], [address] [place], [place] [address]    Creatinine: Recent Labs    06/19/21 1809  CREATININE 2.65*    Xrays: See report/chart As above   Impression/Assessment:  Left UPJX stone with sepsis  Plan:  Cysto retrograde and stent - sequale on sepsis/faulure/perc   Rocsi Hazelbaker A Haim Hansson 06/20/2021, 1:56 AM

## 2021-06-20 NOTE — Progress Notes (Signed)
Patient has returned from PACU.

## 2021-06-20 NOTE — Progress Notes (Signed)
PHARMACIST - PHYSICIAN ORDER COMMUNICATION  CONCERNING: P&T Medication Policy on Herbal Medications  DESCRIPTION:  This patient's order for:  Glucosamine-Chondroitin  has been noted.  This product(s) is classified as an "herbal" or natural product. Due to a lack of definitive safety studies or FDA approval, nonstandard manufacturing practices, plus the potential risk of unknown drug-drug interactions while on inpatient medications, the Pharmacy and Therapeutics Committee does not permit the use of "herbal" or natural products of this type within [facility].   ACTION TAKEN: The pharmacy department is unable to verify this order at this time and your patient has been informed of this safety policy. Please reevaluate patient's clinical condition at discharge and address if the herbal or natural product(s) should be resumed at that time.   Leone Haven, PharmD

## 2021-06-20 NOTE — Progress Notes (Signed)
[place] Progress Note Patient Name: Kimberly Flowers DOB: 1941/01/04 MRN: [id number]   Date of Service  06/20/2021  HPI/Events of Note  Pt experiencing an episode of her chronic chest pain (she usually takes nitro SL 3 times a week or so).    EKG shows no signs of ischemia  eICU Interventions  - ordered PRN nitro same as home dose     Intervention Category Intermediate Interventions: Pain - evaluation and management  Tilden Dome 06/20/2021, 8:01 PM

## 2021-06-20 NOTE — Progress Notes (Signed)
eLink Physician-Brief Progress Note Patient Name: ANNETH BRUNELL DOB: [birth year] MRN: [id number]   Date of Service  06/20/2021  HPI/Events of Note  55 F history of CAD s/p CABG and PCI, HFpEF, hypertension, dyslipidemia, DM, nephrolithiasis. Had a syncopal episode at her urologist's clinic.  Hypotesnive on presentation improved with fluids  Abdominal CT Stable examination with obstructing 4 x 5 x 9 mm calculus within the left ureteropelvic junction resulting in moderate left hydronephrosis and perinephric stranding.  UA with 21-50 WBC Creatinine 2.65 WBC 19.2  BP 133/49  HR 83  O2 93% not on pressors  eICU Interventions  UTI sepsis on ceftriaxone DKI secondary to obstructive uropathy vs hypovolemia, clinically improved with fluids     Intervention Category Evaluation Type: New Patient Evaluation  Shona Needles Bela Bonaparte 06/20/2021, 1:29 AM

## 2021-06-20 NOTE — Progress Notes (Signed)
PHARMACY - PHYSICIAN COMMUNICATION CRITICAL VALUE ALERT - BLOOD CULTURE IDENTIFICATION (BCID)  Kimberly Flowers is an 79 y.o. female who presented to [facility] on 06/19/2021 with a chief complaint of septic shock due to pyelonephritis related to renal stone.   Assessment: BCx: 1/4 bottles growing GNR.  BCID result is Klebsiella pneumoniae without any resistance detected.    Name of physician (or Provider) Contacted: Dr Ander Slade  Current antibiotics: Ceftriaxone 2g IV q24h   Changes to prescribed antibiotics recommended:  Patient is on recommended antibiotics - No changes needed  Results for orders placed or performed during the hospital encounter of 06/19/21  Blood Culture ID Panel (Reflexed) (Collected: 06/19/2021  6:09 PM)  Result Value Ref Range   Enterococcus faecalis NOT DETECTED NOT DETECTED   Enterococcus Faecium NOT DETECTED NOT DETECTED   Listeria monocytogenes NOT DETECTED NOT DETECTED   Staphylococcus species NOT DETECTED NOT DETECTED   Staphylococcus aureus (BCID) NOT DETECTED NOT DETECTED   Staphylococcus epidermidis NOT DETECTED NOT DETECTED   Staphylococcus lugdunensis NOT DETECTED NOT DETECTED   Streptococcus species NOT DETECTED NOT DETECTED   Streptococcus agalactiae NOT DETECTED NOT DETECTED   Streptococcus pneumoniae NOT DETECTED NOT DETECTED   Streptococcus pyogenes NOT DETECTED NOT DETECTED   A.calcoaceticus-baumannii NOT DETECTED NOT DETECTED   Bacteroides fragilis NOT DETECTED NOT DETECTED   Enterobacterales DETECTED (A) NOT DETECTED   Enterobacter cloacae complex NOT DETECTED NOT DETECTED   Escherichia coli NOT DETECTED NOT DETECTED   Klebsiella aerogenes NOT DETECTED NOT DETECTED   Klebsiella oxytoca NOT DETECTED NOT DETECTED   Klebsiella pneumoniae DETECTED (A) NOT DETECTED   Proteus species NOT DETECTED NOT DETECTED   Salmonella species NOT DETECTED NOT DETECTED   Serratia marcescens NOT DETECTED NOT DETECTED   Haemophilus influenzae NOT DETECTED  NOT DETECTED   Neisseria meningitidis NOT DETECTED NOT DETECTED   Pseudomonas aeruginosa NOT DETECTED NOT DETECTED   Stenotrophomonas maltophilia NOT DETECTED NOT DETECTED   Candida albicans NOT DETECTED NOT DETECTED   Candida auris NOT DETECTED NOT DETECTED   Candida glabrata NOT DETECTED NOT DETECTED   Candida krusei NOT DETECTED NOT DETECTED   Candida parapsilosis NOT DETECTED NOT DETECTED   Candida tropicalis NOT DETECTED NOT DETECTED   Cryptococcus neoformans/gattii NOT DETECTED NOT DETECTED   CTX-M ESBL NOT DETECTED NOT DETECTED   Carbapenem resistance IMP NOT DETECTED NOT DETECTED   Carbapenem resistance KPC NOT DETECTED NOT DETECTED   Carbapenem resistance NDM NOT DETECTED NOT DETECTED   Carbapenem resist OXA 48 LIKE NOT DETECTED NOT DETECTED   Carbapenem resistance VIM NOT DETECTED NOT DETECTED     Gretta Arab PharmD, BCPS Clinical Pharmacist [facility] main pharmacy [phone] 06/20/2021 2:58 PM

## 2021-06-21 ENCOUNTER — Encounter (HOSPITAL_COMMUNITY): Payer: Self-pay | Admitting: Urology

## 2021-06-21 DIAGNOSIS — A419 Sepsis, unspecified organism: Secondary | ICD-10-CM | POA: Diagnosis not present

## 2021-06-21 DIAGNOSIS — N200 Calculus of kidney: Secondary | ICD-10-CM | POA: Diagnosis not present

## 2021-06-21 DIAGNOSIS — B961 Klebsiella pneumoniae [K. pneumoniae] as the cause of diseases classified elsewhere: Secondary | ICD-10-CM

## 2021-06-21 DIAGNOSIS — R7881 Bacteremia: Secondary | ICD-10-CM

## 2021-06-21 DIAGNOSIS — R6521 Severe sepsis with septic shock: Secondary | ICD-10-CM | POA: Diagnosis not present

## 2021-06-21 LAB — GLUCOSE, CAPILLARY
Glucose-Capillary: 129 mg/dL — ABNORMAL HIGH (ref 70–99)
Glucose-Capillary: 104 mg/dL — ABNORMAL HIGH (ref 70–99)
Glucose-Capillary: 116 mg/dL — ABNORMAL HIGH (ref 70–99)
Glucose-Capillary: 143 mg/dL — ABNORMAL HIGH (ref 70–99)

## 2021-06-21 LAB — CBC
HCT: 33.4 % — ABNORMAL LOW (ref 36.0–46.0)
Hemoglobin: 11 g/dL — ABNORMAL LOW (ref 12.0–15.0)
MCH: 30.6 pg (ref 26.0–34.0)
MCHC: 32.9 g/dL (ref 30.0–36.0)
MCV: 92.8 fL (ref 80.0–100.0)
Platelets: 113 10*3/uL — ABNORMAL LOW (ref 150–400)
RBC: 3.6 MIL/uL — ABNORMAL LOW (ref 3.87–5.11)
RDW: 13.7 % (ref 11.5–15.5)
WBC: 6 10*3/uL (ref 4.0–10.5)
nRBC: 0 % (ref 0.0–0.2)

## 2021-06-21 LAB — COMPREHENSIVE METABOLIC PANEL
ALT: 74 U/L — ABNORMAL HIGH (ref 0–44)
AST: 29 U/L (ref 15–41)
Albumin: 2.8 g/dL — ABNORMAL LOW (ref 3.5–5.0)
Alkaline Phosphatase: 84 U/L (ref 38–126)
Anion gap: 8 (ref 5–15)
BUN: 26 mg/dL — ABNORMAL HIGH (ref 8–23)
CO2: 22 mmol/L (ref 22–32)
Calcium: 8 mg/dL — ABNORMAL LOW (ref 8.9–10.3)
Chloride: 104 mmol/L (ref 98–111)
Creatinine, Ser: 0.67 mg/dL (ref 0.44–1.00)
GFR, Estimated: >60 mL/min (ref >60)
Glucose, Bld: 127 mg/dL — ABNORMAL HIGH (ref 70–99)
Potassium: 3.4 mmol/L — ABNORMAL LOW (ref 3.5–5.1)
Sodium: 134 mmol/L — ABNORMAL LOW (ref 135–145)
Total Bilirubin: 1 mg/dL (ref 0.3–1.2)
Total Protein: 5.4 g/dL — ABNORMAL LOW (ref 6.5–8.1)

## 2021-06-21 LAB — MAGNESIUM: Magnesium: 1.9 mg/dL (ref 1.7–2.4)

## 2021-06-21 MED ORDER — OXYBUTYNIN CHLORIDE 5 MG PO TABS
5.0000 mg | ORAL_TABLET | Freq: Three times a day (TID) | ORAL | Status: DC | PRN
  Administered 2021-06-21 (×2): 5 mg via ORAL
  Filled 2021-06-21 (×2): qty 1

## 2021-06-21 MED ORDER — MAGNESIUM SULFATE 2 GM/50ML IV SOLN
2.0000 g | Freq: Once | INTRAVENOUS | Status: AC
  Administered 2021-06-21: 2 g via INTRAVENOUS
  Filled 2021-06-21: qty 50

## 2021-06-21 MED ORDER — POTASSIUM CHLORIDE CRYS ER 20 MEQ PO TBCR
40.0000 meq | EXTENDED_RELEASE_TABLET | Freq: Once | ORAL | Status: AC
  Administered 2021-06-21: 40 meq via ORAL
  Filled 2021-06-21: qty 2

## 2021-06-21 NOTE — Progress Notes (Signed)
PROGRESS NOTE  Kimberly Flowers:[id number] DOB: 1941-06-16 DOA: 06/19/2021 PCP: [name], MD   LOS: 2 days   Brief Narrative / Interim history: 80 year old female with history of coronary artery disease, PAD, hypertension, hyperlipidemia, DM2, history of breast cancer, comes into the hospital and is admitted to ICU shock in the setting of obstructive nephrolithiasis.  Urology consulted and she was taken emergently to the [facility] on 10/13 and she is status post left ureteral stent placement by Dr. Matilde Sprang.  She apparently was briefly on norepinephrine perioperatively. Blood cultures positive for gram-negative rods, Klebsiella.  She improved postop and was transferred to the hospitalist service on 10/14.  Subjective / 24h Interval events: Doing well this morning, somewhat emotional that she was as sick as she was.  Also had a brief episode of chest pain overnight relieved with sublingual nitroglycerin.  Assessment & Plan: Principal Problem Septic shock due to Klebsiella pneumonia bacteremia, obstructive nephrolithiasis - initially admitted to the ICU, transferred to the hospitalist service on 10/14.  She is status post stent placement by urology on 10/13. -Continue IV ceftriaxone for now, await sensitivities. -WBC is normal, she is afebrile.  Shock physiology resolved -She will need to follow-up with Dr. Felipa Eth with [facility] urology as an outpatient  Active Problems Coronary artery disease, ischemic cardiomyopathy, peripheral arterial disease, hypertension, hyperlipidemia, chronic systolic CHF-continue aspirin, atorvastatin, Plavix.  She is followed by cardiology as an outpatient -2D echo done 10/13 showed global hypokinesis and mild decreased LV function 45-50%, suspect related to gram-negative bacteremia also -Had an episode of chest pain last night relieved by sublingual nitroglycerin.  This is somewhat chronic for Flowers, as it was documented in cardiology office that she  occasionally gets chest pains at home relieved by nitroglycerin.  Continue to monitor, if chest pain gets severe or is not relieved by NTG will obtain cardiac markers  DM2-continue metformin, monitor CBGs  Elevated LFTs-stable, improving, possibly in the setting of septic shock  RML nodule 39mm -on chart review, appears this is new.  Given Flowers active breast ca, will need further outpatient follow-up/ workup.  She is a never smoker   Right breast cancer - on letrozole and starting radiation.  F/b [facility]. Continue outpatient f/u    Scheduled Meds:  amLODipine  10 mg Oral Daily   vitamin C with rose hips  1,000 mg Oral Daily   aspirin EC  81 mg Oral Daily   atorvastatin  80 mg Oral Daily   calcium-vitamin D  1 tablet Oral Q breakfast   Chlorhexidine Gluconate Cloth  6 each Topical Daily   cholecalciferol  1,000 Units Oral Daily   clopidogrel  75 mg Oral Daily   ferrous gluconate  324 mg Oral QHS   heparin  5,000 Units Subcutaneous Q8H   insulin aspart  0-15 Units Subcutaneous TID WC   letrozole  2.5 mg Oral Daily   mouth rinse  15 mL Mouth Rinse BID   [START ON 06/22/2021] metFORMIN  500 mg Oral Q supper   multivitamin with minerals  1 tablet Oral QPM   nebivolol  10 mg Oral Daily   pantoprazole  40 mg Oral Daily   Continuous Infusions:  sodium chloride Stopped (06/20/21 0202)   sodium chloride     cefTRIAXone (ROCEPHIN)  IV Stopped (06/20/21 1206)   PRN Meds:.acetaminophen, docusate sodium, HYDROcodone-acetaminophen, morphine injection, nitroGLYCERIN, ondansetron, oxybutynin, polyethylene glycol  Diet Orders (From admission, onward)     Start     Ordered  06/20/21 0735  Diet Carb Modified Fluid consistency: Thin; Room service appropriate? Yes  Diet effective now       Question Answer Comment  Diet-HS Snack? Nothing   Calorie Level Medium 1600-2000   Fluid consistency: Thin   Room service appropriate? Yes      06/20/21 0734            DVT prophylaxis: heparin  injection 5,000 Units Start: 06/20/21 0600 SCDs Start: 06/20/21 0429     Code Status: Full Code  Family Communication: No family at bedside  Status is: Inpatient  Remains inpatient appropriate because: Due to severity of illness  Level of care: Telemetry  Consultants:  Urology Critical care  Procedures:  Ureteral stent placement 10/13  Microbiology  Blood cultures-gram-negative rods  Antimicrobials: Ceftriaxone 10/12   Objective: Vitals:   06/21/21 0500 06/21/21 0619 06/21/21 0700 06/21/21 0821  BP:  (!) 163/63 (!) 152/113   Pulse:  83 87   Resp:  (!) 24 (!) 25   Temp:    98.7 F (37.1 C)  TempSrc:    Oral  SpO2:  92% 96%   Weight: 85.1 kg     Height:        Intake/Output Summary (Last 24 hours) at 06/21/2021 1117 Last data filed at 06/21/2021 0940 Gross per 24 hour  Intake 3376.86 ml  Output 3150 ml  Net 226.86 ml   Filed Weights   06/19/21 1639 06/20/21 0115 06/21/21 0500  Weight: 81.6 kg 83.7 kg 85.1 kg    Examination:  Constitutional: NAD Eyes: no scleral icterus ENMT: Mucous membranes are moist.  Neck: normal, supple Respiratory: clear to auscultation bilaterally, no wheezing, no crackles. Cardiovascular: Regular rate and rhythm, no murmurs / rubs / gallops. No LE edema.  Abdomen: non distended, no tenderness. Bowel sounds positive.  Musculoskeletal: no clubbing / cyanosis.  Skin: no rashes Neurologic: non focal    Data Reviewed: I have independently reviewed following labs and imaging studies  CBC: Recent Labs  Lab 06/19/21 1809 06/20/21 0230 06/21/21 0255  WBC 19.2* 9.5 6.0  NEUTROABS 18.6* 8.7*  --   HGB 13.7 13.0 11.0*  HCT 41.9 38.1 33.4*  MCV 95.2 91.6 92.8  PLT 98* 126* 193*   Basic Metabolic Panel: Recent Labs  Lab 06/19/21 1809 06/20/21 0230 06/21/21 0255  NA 133* 141 134*  K 4.5 4.0 3.4*  CL 103 109 104  CO2 17* 20* 22  GLUCOSE 142* 123* 127*  BUN 44* 39* 26*  CREATININE 2.65* 1.99* 0.67  CALCIUM 8.2* 8.4*  8.0*  MG 1.8  --  1.9   Liver Function Tests: Recent Labs  Lab 06/20/21 0230 06/21/21 0255  AST 71* 29  ALT 123* 74*  ALKPHOS 102 84  BILITOT 1.0 1.0  PROT 6.2* 5.4*  ALBUMIN 3.3* 2.8*   Coagulation Profile: Recent Labs  Lab 06/20/21 0230  INR 1.3*   HbA1C: Recent Labs    06/20/21 1729  HGBA1C 6.4*   CBG: Recent Labs  Lab 06/20/21 0731 06/20/21 1208 06/20/21 1621 06/20/21 2122 06/21/21 0745  GLUCAP 125* 132* 138* 127* 129*    Recent Results (from the past 240 hour(s))  Culture, blood (routine x 2)     Status: Abnormal (Preliminary result)   Collection Time: 06/19/21  6:09 PM   Specimen: Left Antecubital; Blood  Result Value Ref Range Status   Specimen Description   Final    LEFT ANTECUBITAL Performed at [facility], [address] [place], [place] [address]  Special Requests   Final    Blood Culture results may not be optimal due to an excessive volume of blood received in culture bottles BOTTLES DRAWN AEROBIC AND ANAEROBIC Performed at [facility], [address], [place], [place] [address]    Culture  Setup Time   Final    GRAM NEGATIVE RODS IN BOTH AEROBIC AND ANAEROBIC BOTTLES Gram Stain Report Called to,Read Back By and Verified With: PULEO,R AT 4403 ON 10.13.22 BY RUCINSKI,B CRITICAL RESULT CALLED TO, READ BACK BY AND VERIFIED WITH: C,SHADE PHARMD @1456  06/20/21 EB Performed at [facility], [address], [place], [place] [address]    Culture (A)  Final    KLEBSIELLA PNEUMONIAE SUSCEPTIBILITIES TO FOLLOW Performed at [facility], [place] [address] [place], [place] [address]    Report Status PENDING  Incomplete  Culture, blood (routine x 2)     Status: None (Preliminary result)   Collection Time: 06/19/21  6:09 PM   Specimen: BLOOD LEFT HAND  Result Value Ref Range Status   Specimen Description   Final    BLOOD LEFT HAND Performed at [facility], [address] [place], [place] [address]    Special Requests   Final    Blood  Culture adequate volume BOTTLES DRAWN AEROBIC AND ANAEROBIC Performed at [facility], [address], [place], [place] [address]    Culture  Setup Time   Final    GRAM NEGATIVE RODS IN BOTH AEROBIC AND ANAEROBIC BOTTLES CRITICAL VALUE NOTED.  VALUE IS CONSISTENT WITH PREVIOUSLY REPORTED AND CALLED VALUE. Performed at [facility], [address], [place], [place] [address]    Culture   Final    Lonell Grandchild NEGATIVE RODS IDENTIFICATION TO FOLLOW Performed at [facility], [place] [address] [place], [place] [address]    Report Status PENDING  Incomplete  Blood Culture ID Panel (Reflexed)     Status: Abnormal   Collection Time: 06/19/21  6:09 PM  Result Value Ref Range Status   Enterococcus faecalis NOT DETECTED NOT DETECTED Final   Enterococcus Faecium NOT DETECTED NOT DETECTED Final   Listeria monocytogenes NOT DETECTED NOT DETECTED Final   Staphylococcus species NOT DETECTED NOT DETECTED Final   Staphylococcus aureus (BCID) NOT DETECTED NOT DETECTED Final   Staphylococcus epidermidis NOT DETECTED NOT DETECTED Final   Staphylococcus lugdunensis NOT DETECTED NOT DETECTED Final   Streptococcus species NOT DETECTED NOT DETECTED Final   Streptococcus agalactiae NOT DETECTED NOT DETECTED Final   Streptococcus pneumoniae NOT DETECTED NOT DETECTED Final   Streptococcus pyogenes NOT DETECTED NOT DETECTED Final   A.calcoaceticus-baumannii NOT DETECTED NOT DETECTED Final   Bacteroides fragilis NOT DETECTED NOT DETECTED Final   Enterobacterales DETECTED (A) NOT DETECTED Final    Comment: Enterobacterales represent a large order of gram negative bacteria, not a single organism. CRITICAL RESULT CALLED TO, READ BACK BY AND VERIFIED WITH: C,SHADE PHARMD @1456  06/20/21 EB    Enterobacter cloacae complex NOT DETECTED NOT DETECTED Final   Escherichia coli NOT DETECTED NOT DETECTED Final   Klebsiella aerogenes NOT DETECTED NOT DETECTED Final   Klebsiella oxytoca NOT DETECTED NOT DETECTED Final    Klebsiella pneumoniae DETECTED (A) NOT DETECTED Final    Comment: CRITICAL RESULT CALLED TO, READ BACK BY AND VERIFIED WITH: C,SHADE PHARMD @1456  06/20/21 EB    Proteus species NOT DETECTED NOT DETECTED Final   Salmonella species NOT DETECTED NOT DETECTED Final   Serratia marcescens NOT DETECTED NOT DETECTED Final   Haemophilus influenzae NOT DETECTED  NOT DETECTED Final   Neisseria meningitidis NOT DETECTED NOT DETECTED Final   Pseudomonas aeruginosa NOT DETECTED NOT DETECTED Final   Stenotrophomonas maltophilia NOT DETECTED NOT DETECTED Final   Candida albicans NOT DETECTED NOT DETECTED Final   Candida auris NOT DETECTED NOT DETECTED Final   Candida glabrata NOT DETECTED NOT DETECTED Final   Candida krusei NOT DETECTED NOT DETECTED Final   Candida parapsilosis NOT DETECTED NOT DETECTED Final   Candida tropicalis NOT DETECTED NOT DETECTED Final   Cryptococcus neoformans/gattii NOT DETECTED NOT DETECTED Final   CTX-M ESBL NOT DETECTED NOT DETECTED Final   Carbapenem resistance IMP NOT DETECTED NOT DETECTED Final   Carbapenem resistance KPC NOT DETECTED NOT DETECTED Final   Carbapenem resistance NDM NOT DETECTED NOT DETECTED Final   Carbapenem resist OXA 48 LIKE NOT DETECTED NOT DETECTED Final   Carbapenem resistance VIM NOT DETECTED NOT DETECTED Final    Comment: Performed at [facility], [address] [place], [place] [address]  Resp Panel by RT-PCR (Flu A&B, Covid) Nasopharyngeal Swab     Status: None   Collection Time: 06/19/21  6:16 PM   Specimen: Nasopharyngeal Swab; Nasopharyngeal(NP) swabs in vial transport medium  Result Value Ref Range Status   SARS Coronavirus 2 by RT PCR NEGATIVE NEGATIVE Final    Comment: (NOTE) SARS-CoV-2 target nucleic acids are NOT DETECTED.  The SARS-CoV-2 RNA is generally detectable in upper respiratory specimens during the acute phase of infection. The lowest concentration of SARS-CoV-2 viral copies this assay can detect is 138 copies/mL.  A negative result does not preclude SARS-Cov-2 infection and should not be used as the sole basis for treatment or other patient management decisions. A negative result may occur with  improper specimen collection/handling, submission of specimen other than nasopharyngeal swab, presence of viral mutation(s) within the areas targeted by this assay, and inadequate number of viral copies(<138 copies/mL). A negative result must be combined with clinical observations, patient history, and epidemiological information. The expected result is Negative.  Fact Sheet for Patients:  EntrepreneurPulse.com.au  Fact Sheet for Healthcare Providers:  IncredibleEmployment.be  This test is no t yet approved or cleared by the [place] FDA and  has been authorized for detection and/or diagnosis of SARS-CoV-2 by FDA under an Emergency Use Authorization (EUA). This EUA will remain  in effect (meaning this test can be used) for the duration of the COVID-19 declaration under Section 564(b)(1) of the Act, 21 U.S.C.section 360bbb-3(b)(1), unless the authorization is terminated  or revoked sooner.       Influenza A by PCR NEGATIVE NEGATIVE Final   Influenza B by PCR NEGATIVE NEGATIVE Final    Comment: (NOTE) The Xpert Xpress SARS-CoV-2/FLU/RSV plus assay is intended as an aid in the diagnosis of influenza from Nasopharyngeal swab specimens and should not be used as a sole basis for treatment. Nasal washings and aspirates are unacceptable for Xpert Xpress SARS-CoV-2/FLU/RSV testing.  Fact Sheet for Patients: EntrepreneurPulse.com.au  Fact Sheet for Healthcare Providers: IncredibleEmployment.be  This test is not yet approved or cleared by the [place] FDA and has been authorized for detection and/or diagnosis of SARS-CoV-2 by FDA under an Emergency Use Authorization (EUA). This EUA will remain in effect (meaning this test  can be used) for the duration of the COVID-19 declaration under Section 564(b)(1) of the Act, 21 U.S.C. section 360bbb-3(b)(1), unless the authorization is terminated or revoked.  Performed at [facility], [address], [place], [place] [address]   Urine Culture  Status: None (Preliminary result)   Collection Time: 06/19/21  9:30 PM   Specimen: Urine, Catheterized  Result Value Ref Range Status   Specimen Description   Final    URINE, CATHETERIZED Performed at [facility], [address] [place], [place] [address]    Special Requests   Final    NONE Performed at [facility], [address], [place], [place] [address]    Culture   Final    CULTURE REINCUBATED FOR BETTER GROWTH Performed at [facility], [place] [address], [place], [place] [address]    Report Status PENDING  Incomplete  MRSA Next Gen by PCR, Nasal     Status: None   Collection Time: 06/20/21  1:55 AM   Specimen: Nasal Mucosa; Nasal Swab  Result Value Ref Range Status   MRSA by PCR Next Gen NOT DETECTED NOT DETECTED Final    Comment: (NOTE) The GeneXpert MRSA Assay (FDA approved for NASAL specimens only), is one component of a comprehensive MRSA colonization surveillance program. It is not intended to diagnose MRSA infection nor to guide or monitor treatment for MRSA infections. Test performance is not FDA approved in patients less than 31 years old. Performed at [facility], [place] [address] [place], [place] [address]      Radiology Studies: ECHOCARDIOGRAM COMPLETE  Result Date: 06/20/2021    ECHOCARDIOGRAM REPORT   Patient Name:   Kimberly Flowers Probert Date of Exam: 06/20/2021 Medical Rec #:  [id number]        Height:       65.5 in Accession #:    [id number]       Weight:       184.5 lb Date of Birth:  Oct 04, 1940        BSA:          1.922 m Patient Age:    87 years         BP:           134/42 mmHg Patient Gender: F                HR:           65 bpm. Exam Location:  Inpatient  Procedure: 2D Echo, Cardiac Doppler and Color Doppler Indications:    Acute ischemic heart disease, unspecified I24.9  History:        Patient has prior history of Echocardiogram examinations, most                 recent 08/17/2015. Risk Factors:Diabetes, Hypertension and                 Dyslipidemia.  Sonographer:    Bernadene Person RDCS Referring Phys: 50 SCOTT MACDIARMID IMPRESSIONS  1. LV function low normal to mildly reduced (EF 50).  2. Left ventricular ejection fraction, by estimation, is 45 to 50%. The left ventricle has mildly decreased function. The left ventricle demonstrates global hypokinesis. Left ventricular diastolic parameters are consistent with Grade I diastolic dysfunction (impaired relaxation). Elevated left atrial pressure.  3. Right ventricular systolic function is normal. The right ventricular size is normal. Tricuspid regurgitation signal is inadequate for assessing PA pressure.  4. The mitral valve is normal in structure. No evidence of mitral valve regurgitation. No evidence of mitral stenosis.  5. The aortic valve is tricuspid. Aortic valve regurgitation is trivial. Mild aortic valve sclerosis is present, with no evidence of aortic valve stenosis.  6. The inferior vena cava is normal in size with  greater than 50% respiratory variability, suggesting right atrial pressure of 3 mmHg. FINDINGS  Left Ventricle: Left ventricular ejection fraction, by estimation, is 45 to 50%. The left ventricle has mildly decreased function. The left ventricle demonstrates global hypokinesis. The left ventricular internal cavity size was normal in size. There is  no left ventricular hypertrophy. Left ventricular diastolic parameters are consistent with Grade I diastolic dysfunction (impaired relaxation). Elevated left atrial pressure. Right Ventricle: The right ventricular size is normal. Right ventricular systolic function is normal. Tricuspid regurgitation signal is inadequate for assessing PA pressure. The  tricuspid regurgitant velocity is 2.21 m/s, and with an assumed right atrial  pressure of 3 mmHg, the estimated right ventricular systolic pressure is 41.3 mmHg. Left Atrium: Left atrial size was normal in size. Right Atrium: Right atrial size was normal in size. Pericardium: There is no evidence of pericardial effusion. Mitral Valve: The mitral valve is normal in structure. No evidence of mitral valve regurgitation. No evidence of mitral valve stenosis. Tricuspid Valve: The tricuspid valve is normal in structure. Tricuspid valve regurgitation is mild . No evidence of tricuspid stenosis. Aortic Valve: The aortic valve is tricuspid. Aortic valve regurgitation is trivial. Mild aortic valve sclerosis is present, with no evidence of aortic valve stenosis. Pulmonic Valve: The pulmonic valve was normal in structure. Pulmonic valve regurgitation is trivial. No evidence of pulmonic stenosis. Aorta: The aortic root is normal in size and structure. Venous: The inferior vena cava is normal in size with greater than 50% respiratory variability, suggesting right atrial pressure of 3 mmHg. IAS/Shunts: No atrial level shunt detected by color flow Doppler. Additional Comments: LV function low normal to mildly reduced (EF 50).  LEFT VENTRICLE PLAX 2D LVIDd:         5.10 cm   Diastology LVIDs:         3.50 cm   LV e' medial:    6.25 cm/s LV PW:         1.20 cm   LV E/e' medial:  16.0 LV IVS:        0.90 cm   LV e' lateral:   10.60 cm/s LVOT diam:     2.20 cm   LV E/e' lateral: 9.4 LV SV:         97 LV SV Index:   50 LVOT Area:     3.80 cm  RIGHT VENTRICLE RV S prime:     13.10 cm/s TAPSE (M-mode): 2.0 cm LEFT ATRIUM           Index        RIGHT ATRIUM           Index LA diam:      3.90 cm 2.03 cm/m   RA Area:     25.20 cm LA Vol (A2C): 55.2 ml 28.71 ml/m  RA Volume:   80.00 ml  41.61 ml/m LA Vol (A4C): 48.2 ml 25.07 ml/m  AORTIC VALVE LVOT Vmax:   119.00 cm/s LVOT Vmean:  82.600 cm/s LVOT VTI:    0.255 m MITRAL VALVE                 TRICUSPID VALVE MV Area (PHT): 4.80 cm     TR Peak grad:   19.5 mmHg MV Decel Time: 158 msec     TR Vmax:        221.00 cm/s MV E velocity: 100.00 cm/s MV A velocity: 85.30 cm/s   SHUNTS MV E/A ratio:  1.17  Systemic VTI:  0.26 m                             Systemic Diam: 2.20 cm Kirk Ruths MD Electronically signed by Kirk Ruths MD Signature Date/Time: 06/20/2021/4:30:19 PM    Final      Marzetta Board, MD, PhD [facility]  Between 7 am - 7 pm I am available, please contact me via Amion (for emergencies) or Securechat (non urgent messages)  Between 7 pm - 7 am I am not available, please contact night coverage MD/APP via Amion

## 2021-06-21 NOTE — Progress Notes (Signed)
Potassium and magnesium replaced per eLink electrolyte protocol. Criteria met.

## 2021-06-21 NOTE — Anesthesia Postprocedure Evaluation (Signed)
Anesthesia Post Note  Patient: VICI NOVICK  Procedure(s) Performed: CYSTOSCOPY WITH RETROGRADE PYELOGRAM/URETERAL STENT PLACEMENT (Left: Ureter)     Patient location during evaluation: PACU Anesthesia Type: General Level of consciousness: awake and alert Pain management: pain level controlled Vital Signs Assessment: post-procedure vital signs reviewed and stable Respiratory status: spontaneous breathing, nonlabored ventilation, respiratory function stable and patient connected to nasal cannula oxygen Cardiovascular status: blood pressure returned to baseline and stable Postop Assessment: no apparent nausea or vomiting Anesthetic complications: no   No notable events documented.  Last Vitals:  Vitals:   06/21/21 0700 06/21/21 0821  BP: (!) 152/113   Pulse: 87   Resp: (!) 25   Temp:  37.1 C  SpO2: 96%     Last Pain:  Vitals:   06/21/21 0821  TempSrc: Oral  PainSc:                  Midge Momon S

## 2021-06-22 DIAGNOSIS — N12 Tubulo-interstitial nephritis, not specified as acute or chronic: Secondary | ICD-10-CM | POA: Diagnosis not present

## 2021-06-22 DIAGNOSIS — I959 Hypotension, unspecified: Secondary | ICD-10-CM | POA: Diagnosis not present

## 2021-06-22 DIAGNOSIS — N179 Acute kidney failure, unspecified: Secondary | ICD-10-CM

## 2021-06-22 DIAGNOSIS — A419 Sepsis, unspecified organism: Secondary | ICD-10-CM | POA: Diagnosis not present

## 2021-06-22 LAB — CBC
HCT: 38.3 % (ref 36.0–46.0)
Hemoglobin: 13.1 g/dL (ref 12.0–15.0)
MCH: 30.5 pg (ref 26.0–34.0)
MCHC: 34.2 g/dL (ref 30.0–36.0)
MCV: 89.1 fL (ref 80.0–100.0)
Platelets: 136 10*3/uL — ABNORMAL LOW (ref 150–400)
RBC: 4.3 MIL/uL (ref 3.87–5.11)
RDW: 13.2 % (ref 11.5–15.5)
WBC: 6.2 10*3/uL (ref 4.0–10.5)
nRBC: 0 % (ref 0.0–0.2)

## 2021-06-22 LAB — BASIC METABOLIC PANEL
Anion gap: 8 (ref 5–15)
BUN: 12 mg/dL (ref 8–23)
CO2: 27 mmol/L (ref 22–32)
Calcium: 8.4 mg/dL — ABNORMAL LOW (ref 8.9–10.3)
Chloride: 100 mmol/L (ref 98–111)
Creatinine, Ser: 0.56 mg/dL (ref 0.44–1.00)
GFR, Estimated: >60 mL/min (ref >60)
Glucose, Bld: 135 mg/dL — ABNORMAL HIGH (ref 70–99)
Potassium: 3.5 mmol/L (ref 3.5–5.1)
Sodium: 135 mmol/L (ref 135–145)

## 2021-06-22 LAB — CULTURE, BLOOD (ROUTINE X 2): Special Requests: ADEQUATE

## 2021-06-22 LAB — MAGNESIUM: Magnesium: 1.8 mg/dL (ref 1.7–2.4)

## 2021-06-22 LAB — GLUCOSE, CAPILLARY
Glucose-Capillary: 176 mg/dL — ABNORMAL HIGH (ref 70–99)
Glucose-Capillary: 89 mg/dL (ref 70–99)

## 2021-06-22 MED ORDER — OXYBUTYNIN CHLORIDE 5 MG PO TABS: 5.0000 mg | ORAL_TABLET | Freq: Three times a day (TID) | ORAL | 0 refills | Status: DC | PRN

## 2021-06-22 MED ORDER — CEFADROXIL 500 MG PO CAPS: 1000.0000 mg | ORAL_CAPSULE | Freq: Two times a day (BID) | ORAL | 0 refills | Status: AC

## 2021-06-22 MED ORDER — HYDROCODONE-ACETAMINOPHEN 5-325 MG PO TABS: 1.0000 | ORAL_TABLET | Freq: Four times a day (QID) | ORAL | 0 refills | Status: DC | PRN

## 2021-06-22 NOTE — Discharge Summary (Signed)
Physician Discharge Summary  Kimberly Flowers WIO:[id number] DOB: 23-May-1941 DOA: 06/19/2021  PCP: [name], MD  Admit date: 06/19/2021 Discharge date: 06/22/2021  Admitted From: home Disposition:  home  Recommendations for Outpatient Follow-up:  Follow up with PCP in 1-2 weeks Follow-up with Dr. Felipa Eth, urology in [place] in 1 week  Home Health: none Equipment/Devices: none  Discharge Condition: stable CODE STATUS: Full code Diet recommendation: Heart healthy  HPI: Per admitting MD, 80 year old woman who is being transferred to [facility] for management of septic shock due to left pyelonephritis related to renal stone. She developed left flank pain, nausea and vomiting and chills on 10/11. She was seen by urology as outpatient who recommended hospitalization and surgical management as she was hypotensive in Dr Carlyle Lipa office.  Significant CVD history with prior CABG  - last cath 10/2020 - PCI LADm - DAPT indefinitely.  EF normal as of 2019   Hospital Course / Discharge diagnoses: Principal Problem Septic shock due to Klebsiella pneumonia bacteremia, obstructive nephrolithiasis - initially admitted to the ICU and briefly required pressors, transferred to the hospitalist service on 10/14.  She is status post stent placement by urology on 10/13.  She improved well postoperatively, blood cultures and urine cultures speciated Klebsiella resistant to ampicillin and nitrofurantoin but otherwise sensitive.  She will be switched to cefadroxil 1 g twice daily, discussed with pharmacy, and will defer 6 additional days to complete a 10-day course.  Shock physiology resolved, white count has normalized and she is afebrile and otherwise back to baseline. She will need to follow-up with Dr. Felipa Eth with [facility] urology as an outpatient for definitive treatment of her stone   Active Problems Coronary artery disease, ischemic cardiomyopathy, peripheral arterial disease, hypertension,  hyperlipidemia, chronic systolic CHF-continue aspirin, atorvastatin, Plavix.  She is followed by cardiology as an outpatient -2D echo done 10/13 showed global hypokinesis and mild decreased LV function 45-50%, suspect related to gram-negative bacteremia also -Had an episode of chest pain last night relieved by sublingual nitroglycerin.  This is somewhat chronic for her, as it was documented in cardiology office that she occasionally gets chest pains at home relieved by nitroglycerin.  Outpatient follow-up with cardiology DM2-continue home medications Elevated LFTs-stable, improving, possibly in the setting of septic shock RML nodule 72mm -on chart review, appears this is new.  Given her active breast ca, will need further outpatient follow-up/ workup.  She is a never smoker Right breast cancer - on letrozole and starting radiation.  F/b [facility]. Continue outpatient f/u   Discharge Instructions   Allergies as of 06/22/2021       Reactions   Ranolazine Er Other (See Comments)   Dizziness    Sulfonamide Derivatives    Unknown        Medication List     STOP taking these medications    ciprofloxacin 500 MG tablet Commonly known as: CIPRO       TAKE these medications    acetaminophen 500 MG tablet Commonly known as: TYLENOL Take 500-1,000 mg by mouth every 6 (six) hours as needed for moderate pain.   amLODipine 10 MG tablet Commonly known as: NORVASC TAKE 1 TABLET BY MOUTH EVERY DAY   aspirin 81 MG EC tablet Take 81 mg by mouth daily.   atorvastatin 80 MG tablet Commonly known as: LIPITOR Take 1 tablet (80 mg total) by mouth daily.   Calcium Plus Vitamin D3 600-500 MG-UNIT Caps Generic drug: Calcium Carb-Cholecalciferol Take 1 tablet by mouth daily.  cefadroxil 500 MG capsule Commonly known as: DURICEF Take 2 capsules (1,000 mg total) by mouth 2 (two) times daily for 6 days.   cholecalciferol 25 MCG (1000 UNIT) tablet Commonly known as: VITAMIN D3 Take 1,000 Units  by mouth daily.   clopidogrel 75 MG tablet Commonly known as: PLAVIX Take 1 tablet (75 mg total) by mouth daily.   docusate sodium 100 MG capsule Commonly known as: COLACE Take 100 mg by mouth daily as needed for mild constipation or moderate constipation.   eye wash Soln Place 1 drop into both eyes 3 (three) times a week.   ferrous gluconate 324 MG tablet Commonly known as: FERGON Take 324 mg by mouth daily with breakfast.   fexofenadine 180 MG tablet Commonly known as: ALLEGRA Take 180 mg by mouth daily.   FISH OIL PO Take 1 capsule by mouth daily.   Glucosamine-Chondroitin-MSM-D3 Tabs Take 1 tablet by mouth daily.   HYDROcodone-acetaminophen 5-325 MG tablet Commonly known as: NORCO/VICODIN Take 1 tablet by mouth every 6 (six) hours as needed for up to 5 days for moderate pain.   isosorbide mononitrate 60 MG 24 hr tablet Commonly known as: IMDUR Take 1.5 tablets (90 mg total) by mouth daily.   letrozole 2.5 MG tablet Commonly known as: FEMARA Take 2.5 mg by mouth daily.   losartan 50 MG tablet Commonly known as: COZAAR Take 50 mg by mouth in the morning and at bedtime.   metFORMIN 500 MG tablet Commonly known as: GLUCOPHAGE Take 500 mg by mouth daily with breakfast.   multivitamin with minerals Tabs tablet Take 1 tablet by mouth daily.   nebivolol 10 MG tablet Commonly known as: BYSTOLIC Take 1 tablet (10 mg total) by mouth daily.   nitroGLYCERIN 0.4 MG/SPRAY spray Commonly known as: NITROLINGUAL Place 1 spray under the tongue every 5 (five) minutes x 3 doses as needed for chest pain (if no relief after 3rd dose, proceed to the ED for an evaluation).   oxybutynin 5 MG tablet Commonly known as: DITROPAN Take 1 tablet (5 mg total) by mouth every 8 (eight) hours as needed for bladder spasms (frequency, urgency of urination).   pantoprazole 40 MG tablet Commonly known as: PROTONIX Take 1 tablet (40 mg total) by mouth daily.   PRESERVISION AREDS 2  PO Take 1 tablet by mouth in the morning and at bedtime.   vitamin C with rose hips 500 MG tablet Take 1,000 mg by mouth daily.       Consultations: Critical care Urology  Procedures/Studies: Stent placement   DG Abd 1 View  Result Date: 06/19/2021 CLINICAL DATA:  Left nephrolithiasis EXAM: ABDOMEN - 1 VIEW COMPARISON:  Concurrently performed CT examination FINDINGS: 9 mm calculus is seen within the expected left proximal ureter at the level of the inferior endplate of L3. Vascular calcifications are seen within the left upper quadrant. Phleboliths noted within the left hemipelvis. Normal abdominal gas pattern. No organomegaly. Surgical clips noted within the epigastrium and overlying the right lung base. Cholecystectomy clips seen within the right upper quadrant. IMPRESSION: 9 mm calculus within the proximal left ureter at the level of the inferior endplate of L3. Electronically Signed   By: Fidela Salisbury M.D.   On: 06/19/2021 20:26   DG Chest Port 1 View  Result Date: 06/19/2021 CLINICAL DATA:  80 year old female with history of shortness of breath. EXAM: PORTABLE CHEST 1 VIEW COMPARISON:  No priors. FINDINGS: Lung volumes are low. Elevation of the right hemidiaphragm. Opacity in the  left lung base partially obscuring the left hemidiaphragm. No pleural effusions. No pneumothorax. No pulmonary nodule or mass noted. Pulmonary vasculature is normal. Heart size appears borderline enlarged. Upper mediastinal contours are within normal limits. Atherosclerotic calcifications in the thoracic aorta. Status post median sternotomy. Surgical clips near the gastroesophageal junction. Multiple surgical clips also project over the right upper quadrant of the abdomen. IMPRESSION: 1. Atelectasis and/or consolidation in the left lower lobe. 2. Low lung volumes with elevation of the right hemidiaphragm. 3. Aortic atherosclerosis. Electronically Signed   By: Vinnie Langton M.D.   On: 06/19/2021 17:52   DG  C-Arm 1-60 Min-No Report  Result Date: 06/20/2021 Fluoroscopy was utilized by the requesting physician.  No radiographic interpretation.   ECHOCARDIOGRAM COMPLETE  Result Date: 06/20/2021    ECHOCARDIOGRAM REPORT   Patient Name:   Kimberly Flowers Denman Date of Exam: 06/20/2021 Medical Rec #:  [id number]        Height:       65.5 in Accession #:    [id number]       Weight:       184.5 lb Date of Birth:  Aug 31, 1941        BSA:          1.922 m Patient Age:    7 years         BP:           134/42 mmHg Patient Gender: F                HR:           65 bpm. Exam Location:  Inpatient Procedure: 2D Echo, Cardiac Doppler and Color Doppler Indications:    Acute ischemic heart disease, unspecified I24.9  History:        Patient has prior history of Echocardiogram examinations, most                 recent 08/17/2015. Risk Factors:Diabetes, Hypertension and                 Dyslipidemia.  Sonographer:    Bernadene Person RDCS Referring Phys: 65 SCOTT MACDIARMID IMPRESSIONS  1. LV function low normal to mildly reduced (EF 50).  2. Left ventricular ejection fraction, by estimation, is 45 to 50%. The left ventricle has mildly decreased function. The left ventricle demonstrates global hypokinesis. Left ventricular diastolic parameters are consistent with Grade I diastolic dysfunction (impaired relaxation). Elevated left atrial pressure.  3. Right ventricular systolic function is normal. The right ventricular size is normal. Tricuspid regurgitation signal is inadequate for assessing PA pressure.  4. The mitral valve is normal in structure. No evidence of mitral valve regurgitation. No evidence of mitral stenosis.  5. The aortic valve is tricuspid. Aortic valve regurgitation is trivial. Mild aortic valve sclerosis is present, with no evidence of aortic valve stenosis.  6. The inferior vena cava is normal in size with greater than 50% respiratory variability, suggesting right atrial pressure of 3 mmHg. FINDINGS  Left Ventricle: Left  ventricular ejection fraction, by estimation, is 45 to 50%. The left ventricle has mildly decreased function. The left ventricle demonstrates global hypokinesis. The left ventricular internal cavity size was normal in size. There is  no left ventricular hypertrophy. Left ventricular diastolic parameters are consistent with Grade I diastolic dysfunction (impaired relaxation). Elevated left atrial pressure. Right Ventricle: The right ventricular size is normal. Right ventricular systolic function is normal. Tricuspid regurgitation signal is inadequate for assessing PA pressure. The tricuspid regurgitant velocity  is 2.21 m/s, and with an assumed right atrial  pressure of 3 mmHg, the estimated right ventricular systolic pressure is 82.5 mmHg. Left Atrium: Left atrial size was normal in size. Right Atrium: Right atrial size was normal in size. Pericardium: There is no evidence of pericardial effusion. Mitral Valve: The mitral valve is normal in structure. No evidence of mitral valve regurgitation. No evidence of mitral valve stenosis. Tricuspid Valve: The tricuspid valve is normal in structure. Tricuspid valve regurgitation is mild . No evidence of tricuspid stenosis. Aortic Valve: The aortic valve is tricuspid. Aortic valve regurgitation is trivial. Mild aortic valve sclerosis is present, with no evidence of aortic valve stenosis. Pulmonic Valve: The pulmonic valve was normal in structure. Pulmonic valve regurgitation is trivial. No evidence of pulmonic stenosis. Aorta: The aortic root is normal in size and structure. Venous: The inferior vena cava is normal in size with greater than 50% respiratory variability, suggesting right atrial pressure of 3 mmHg. IAS/Shunts: No atrial level shunt detected by color flow Doppler. Additional Comments: LV function low normal to mildly reduced (EF 50).  LEFT VENTRICLE PLAX 2D LVIDd:         5.10 cm   Diastology LVIDs:         3.50 cm   LV e' medial:    6.25 cm/s LV PW:         1.20  cm   LV E/e' medial:  16.0 LV IVS:        0.90 cm   LV e' lateral:   10.60 cm/s LVOT diam:     2.20 cm   LV E/e' lateral: 9.4 LV SV:         97 LV SV Index:   50 LVOT Area:     3.80 cm  RIGHT VENTRICLE RV S prime:     13.10 cm/s TAPSE (M-mode): 2.0 cm LEFT ATRIUM           Index        RIGHT ATRIUM           Index LA diam:      3.90 cm 2.03 cm/m   RA Area:     25.20 cm LA Vol (A2C): 55.2 ml 28.71 ml/m  RA Volume:   80.00 ml  41.61 ml/m LA Vol (A4C): 48.2 ml 25.07 ml/m  AORTIC VALVE LVOT Vmax:   119.00 cm/s LVOT Vmean:  82.600 cm/s LVOT VTI:    0.255 m MITRAL VALVE                TRICUSPID VALVE MV Area (PHT): 4.80 cm     TR Peak grad:   19.5 mmHg MV Decel Time: 158 msec     TR Vmax:        221.00 cm/s MV E velocity: 100.00 cm/s MV A velocity: 85.30 cm/s   SHUNTS MV E/A ratio:  1.17         Systemic VTI:  0.26 m                             Systemic Diam: 2.20 cm Kirk Ruths MD Electronically signed by Kirk Ruths MD Signature Date/Time: 06/20/2021/4:30:19 PM    Final    CT Renal Stone Study  Result Date: 06/19/2021 CLINICAL DATA:  Urinary tract stone, symptomatic/complicated. Hypotension, syncope EXAM: CT ABDOMEN AND PELVIS WITHOUT CONTRAST TECHNIQUE: Multidetector CT imaging of the abdomen and pelvis was performed following the standard protocol without IV  contrast. COMPARISON:  06/18/2021 FINDINGS: Lower chest: 4 mm right middle lobe nodule again noted. Bibasilar atelectasis noted, progressive since prior examination. Long segment stenting the right coronary artery. Mild coronary artery calcification. Mild global cardiomegaly. No pericardial effusion. Postsurgical changes noted within the right breast. Hepatobiliary: No focal liver abnormality is seen. Status post cholecystectomy. No biliary dilatation. 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 The kidneys are normal in size and position. Moderate left hydronephrosis and  perinephric stranding is again identified secondary to an obstructing 4 x 5 x 9 mm calculus within the left ureteropelvic junction, unchanged from prior examination. Mildly complex, faintly rim calcified 2.6 cm cortical cyst noted within the left kidney. No additional nephro or urolithiasis. No hydronephrosis on the right. The bladder is unremarkable. Stomach/Bowel: Stomach, small bowel, and large bowel are unremarkable. Appendix absent. No free intraperitoneal gas or fluid. Vascular/Lymphatic: Aortic atherosclerosis. No enlarged abdominal or pelvic lymph nodes. Reproductive: Status post hysterectomy. No adnexal masses. Other: Tiny bilateral fat containing inguinal hernias. Musculoskeletal: Osseous structures are diffusely osteopenic. Degenerative changes seen within the lumbar spine. No lytic or blastic bone lesion. IMPRESSION: Stable examination with obstructing 4 x 5 x 9 mm calculus within the left ureteropelvic junction resulting in moderate left hydronephrosis and perinephric stranding. 4 mm right middle lobe pulmonary nodule. No follow-up needed if patient is low-risk. Non-contrast chest CT can be considered in 12 months if patient is high-risk. This recommendation follows the consensus statement: Guidelines for Management of Incidental Pulmonary Nodules Detected on CT Images: From the Fleischner Society 2017; Radiology 2017; 284:228-243. 2.6 cm rim calcified mildly complex cyst within the left kidney, not well characterized on this single-phase examination. Dedicated renal sonography is recommended for further evaluation once the patient's acute issues have resolved. Aortic Atherosclerosis (ICD10-I70.0). Electronically Signed   By: Fidela Salisbury M.D.   On: 06/19/2021 20:24    Subjective: - no chest pain, shortness of breath, no abdominal pain, nausea or vomiting.   Discharge Exam: BP (!) 165/72   Pulse 81   Temp 98.5 F (36.9 C) (Oral)   Resp 17   Ht 5' 5.5" (1.664 m)   Wt 80.5 kg   SpO2 91%    BMI 29.09 kg/m   General: Pt is alert, awake, not in acute distress Cardiovascular: RRR, S1/S2 +, no rubs, no gallops Respiratory: CTA bilaterally, no wheezing, no rhonchi Abdominal: Soft, NT, ND, bowel sounds + Extremities: no edema, no cyanosis   The results of significant diagnostics from this hospitalization (including imaging, microbiology, ancillary and laboratory) are listed below for reference.     Microbiology: Recent Results (from the past 240 hour(s))  Culture, blood (routine x 2)     Status: Abnormal   Collection Time: 06/19/21  6:09 PM   Specimen: Left Antecubital; Blood  Result Value Ref Range Status   Specimen Description   Final    LEFT ANTECUBITAL Performed at [facility], [address], [place], [place] [address]    Special Requests   Final    Blood Culture results may not be optimal due to an excessive volume of blood received in culture bottles BOTTLES DRAWN AEROBIC AND ANAEROBIC Performed at [facility], [address], [place], [place] [address]    Culture  Setup Time   Final    GRAM NEGATIVE RODS IN BOTH AEROBIC AND ANAEROBIC BOTTLES Gram Stain Report Called to,Read Back By and Verified With: PULEO,R AT 1022 ON 10.13.22 BY RUCINSKI,B CRITICAL RESULT CALLED TO,  READ BACK BY AND VERIFIED WITH: C,SHADE PHARMD @1456  06/20/21 EB Performed at [facility], [address] [place], [place] [address]    Culture KLEBSIELLA PNEUMONIAE (A)  Final   Report Status 06/22/2021 FINAL  Final   Organism ID, Bacteria KLEBSIELLA PNEUMONIAE  Final      Susceptibility   Klebsiella pneumoniae - MIC*    AMPICILLIN RESISTANT Resistant     CEFAZOLIN <=4 SENSITIVE Sensitive     CEFEPIME <=0.12 SENSITIVE Sensitive     CEFTAZIDIME <=1 SENSITIVE Sensitive     CEFTRIAXONE <=0.25 SENSITIVE Sensitive     CIPROFLOXACIN <=0.25 SENSITIVE Sensitive     GENTAMICIN <=1 SENSITIVE Sensitive     IMIPENEM <=0.25 SENSITIVE Sensitive     TRIMETH/SULFA <=20 SENSITIVE Sensitive      AMPICILLIN/SULBACTAM <=2 SENSITIVE Sensitive     PIP/TAZO <=4 SENSITIVE Sensitive     * KLEBSIELLA PNEUMONIAE  Culture, blood (routine x 2)     Status: Abnormal   Collection Time: 06/19/21  6:09 PM   Specimen: BLOOD LEFT HAND  Result Value Ref Range Status   Specimen Description   Final    BLOOD LEFT HAND Performed at [facility], [address], [place], [place] [address]    Special Requests   Final    Blood Culture adequate volume BOTTLES DRAWN AEROBIC AND ANAEROBIC Performed at [facility], [address] [place], [place] [address]    Culture  Setup Time   Final    GRAM NEGATIVE RODS IN BOTH AEROBIC AND ANAEROBIC BOTTLES CRITICAL VALUE NOTED.  VALUE IS CONSISTENT WITH PREVIOUSLY REPORTED AND CALLED VALUE. Performed at [facility], [address] [place], [place] [address]    Culture (A)  Final    KLEBSIELLA PNEUMONIAE SUSCEPTIBILITIES PERFORMED ON PREVIOUS CULTURE WITHIN THE LAST 5 DAYS. Performed at [facility], [place] [address] [place], [place] [address]    Report Status 06/22/2021 FINAL  Final  Blood Culture ID Panel (Reflexed)     Status: Abnormal   Collection Time: 06/19/21  6:09 PM  Result Value Ref Range Status   Enterococcus faecalis NOT DETECTED NOT DETECTED Final   Enterococcus Faecium NOT DETECTED NOT DETECTED Final   Listeria monocytogenes NOT DETECTED NOT DETECTED Final   Staphylococcus species NOT DETECTED NOT DETECTED Final   Staphylococcus aureus (BCID) NOT DETECTED NOT DETECTED Final   Staphylococcus epidermidis NOT DETECTED NOT DETECTED Final   Staphylococcus lugdunensis NOT DETECTED NOT DETECTED Final   Streptococcus species NOT DETECTED NOT DETECTED Final   Streptococcus agalactiae NOT DETECTED NOT DETECTED Final   Streptococcus pneumoniae NOT DETECTED NOT DETECTED Final   Streptococcus pyogenes NOT DETECTED NOT DETECTED Final   A.calcoaceticus-baumannii NOT DETECTED NOT DETECTED Final   Bacteroides fragilis NOT DETECTED NOT DETECTED Final    Enterobacterales DETECTED (A) NOT DETECTED Final    Comment: Enterobacterales represent a large order of gram negative bacteria, not a single organism. CRITICAL RESULT CALLED TO, READ BACK BY AND VERIFIED WITH: C,SHADE PHARMD @1456  06/20/21 EB    Enterobacter cloacae complex NOT DETECTED NOT DETECTED Final   Escherichia coli NOT DETECTED NOT DETECTED Final   Klebsiella aerogenes NOT DETECTED NOT DETECTED Final   Klebsiella oxytoca NOT DETECTED NOT DETECTED Final   Klebsiella pneumoniae DETECTED (A) NOT DETECTED Final    Comment: CRITICAL RESULT CALLED TO, READ BACK BY AND VERIFIED WITH: C,SHADE PHARMD @1456  06/20/21 EB    Proteus species NOT DETECTED NOT DETECTED Final   Salmonella species NOT DETECTED NOT DETECTED Final  Serratia marcescens NOT DETECTED NOT DETECTED Final   Haemophilus influenzae NOT DETECTED NOT DETECTED Final   Neisseria meningitidis NOT DETECTED NOT DETECTED Final   Pseudomonas aeruginosa NOT DETECTED NOT DETECTED Final   Stenotrophomonas maltophilia NOT DETECTED NOT DETECTED Final   Candida albicans NOT DETECTED NOT DETECTED Final   Candida auris NOT DETECTED NOT DETECTED Final   Candida glabrata NOT DETECTED NOT DETECTED Final   Candida krusei NOT DETECTED NOT DETECTED Final   Candida parapsilosis NOT DETECTED NOT DETECTED Final   Candida tropicalis NOT DETECTED NOT DETECTED Final   Cryptococcus neoformans/gattii NOT DETECTED NOT DETECTED Final   CTX-M ESBL NOT DETECTED NOT DETECTED Final   Carbapenem resistance IMP NOT DETECTED NOT DETECTED Final   Carbapenem resistance KPC NOT DETECTED NOT DETECTED Final   Carbapenem resistance NDM NOT DETECTED NOT DETECTED Final   Carbapenem resist OXA 48 LIKE NOT DETECTED NOT DETECTED Final   Carbapenem resistance VIM NOT DETECTED NOT DETECTED Final    Comment: Performed at [facility], [address] [place], [place] [address]  Resp Panel by RT-PCR (Flu A&B, Covid) Nasopharyngeal Swab     Status: None    Collection Time: 06/19/21  6:16 PM   Specimen: Nasopharyngeal Swab; Nasopharyngeal(NP) swabs in vial transport medium  Result Value Ref Range Status   SARS Coronavirus 2 by RT PCR NEGATIVE NEGATIVE Final    Comment: (NOTE) SARS-CoV-2 target nucleic acids are NOT DETECTED.  The SARS-CoV-2 RNA is generally detectable in upper respiratory specimens during the acute phase of infection. The lowest concentration of SARS-CoV-2 viral copies this assay can detect is 138 copies/mL. A negative result does not preclude SARS-Cov-2 infection and should not be used as the sole basis for treatment or other patient management decisions. A negative result may occur with  improper specimen collection/handling, submission of specimen other than nasopharyngeal swab, presence of viral mutation(s) within the areas targeted by this assay, and inadequate number of viral copies(<138 copies/mL). A negative result must be combined with clinical observations, patient history, and epidemiological information. The expected result is Negative.  Fact Sheet for Patients:  EntrepreneurPulse.com.au  Fact Sheet for Healthcare Providers:  IncredibleEmployment.be  This test is no t yet approved or cleared by the [place] FDA and  has been authorized for detection and/or diagnosis of SARS-CoV-2 by FDA under an Emergency Use Authorization (EUA). This EUA will remain  in effect (meaning this test can be used) for the duration of the COVID-19 declaration under Section 564(b)(1) of the Act, 21 U.S.C.section 360bbb-3(b)(1), unless the authorization is terminated  or revoked sooner.       Influenza A by PCR NEGATIVE NEGATIVE Final   Influenza B by PCR NEGATIVE NEGATIVE Final    Comment: (NOTE) The Xpert Xpress SARS-CoV-2/FLU/RSV plus assay is intended as an aid in the diagnosis of influenza from Nasopharyngeal swab specimens and should not be used as a sole basis for treatment.  Nasal washings and aspirates are unacceptable for Xpert Xpress SARS-CoV-2/FLU/RSV testing.  Fact Sheet for Patients: EntrepreneurPulse.com.au  Fact Sheet for Healthcare Providers: IncredibleEmployment.be  This test is not yet approved or cleared by the [place] FDA and has been authorized for detection and/or diagnosis of SARS-CoV-2 by FDA under an Emergency Use Authorization (EUA). This EUA will remain in effect (meaning this test can be used) for the duration of the COVID-19 declaration under Section 564(b)(1) of the Act, 21 U.S.C. section 360bbb-3(b)(1), unless the authorization is terminated or revoked.  Performed at [facility]  [facility], [address], [place] [place], [place] [address]   Urine Culture     Status: Abnormal (Preliminary result)   Collection Time: 06/19/21  9:30 PM   Specimen: Urine, Catheterized  Result Value Ref Range Status   Specimen Description URINE, CATHETERIZED  Final   Special Requests NONE  Final   Culture (A)  Final    10,000 COLONIES/mL KLEBSIELLA PNEUMONIAE CULTURE REINCUBATED FOR BETTER GROWTH SUSCEPTIBILITIES TO FOLLOW    Report Status PENDING  Incomplete   Organism ID, Bacteria KLEBSIELLA PNEUMONIAE (A)  Final      Susceptibility   Klebsiella pneumoniae - MIC*    AMPICILLIN >=32 RESISTANT Resistant     CEFAZOLIN <=4 SENSITIVE Sensitive     CEFEPIME <=0.12 SENSITIVE Sensitive     CEFTRIAXONE <=0.25 SENSITIVE Sensitive     CIPROFLOXACIN <=0.25 SENSITIVE Sensitive     GENTAMICIN <=1 SENSITIVE Sensitive     IMIPENEM <=0.25 SENSITIVE Sensitive     NITROFURANTOIN 128 RESISTANT Resistant     TRIMETH/SULFA <=20 SENSITIVE Sensitive     AMPICILLIN/SULBACTAM 4 SENSITIVE Sensitive     PIP/TAZO Value in next row Sensitive      <=4 SENSITIVEPerformed at [facility] [address], [place], [place] [address]    * 10,000 COLONIES/mL KLEBSIELLA PNEUMONIAE  MRSA Next Gen by PCR, Nasal     Status: None   Collection  Time: 06/20/21  1:55 AM   Specimen: Nasal Mucosa; Nasal Swab  Result Value Ref Range Status   MRSA by PCR Next Gen NOT DETECTED NOT DETECTED Final    Comment: (NOTE) The GeneXpert MRSA Assay (FDA approved for NASAL specimens only), is one component of a comprehensive MRSA colonization surveillance program. It is not intended to diagnose MRSA infection nor to guide or monitor treatment for MRSA infections. Test performance is not FDA approved in patients less than 80 years old. Performed at [facility], [place] [address] [place],  [address]      Labs: Basic Metabolic Panel: Recent Labs  Lab 06/19/21 1809 06/20/21 0230 06/21/21 0255 06/22/21 0409  NA 133* 141 134* 135  K 4.5 4.0 3.4* 3.5  CL 103 109 104 100  CO2 17* 20* 22 27  GLUCOSE 142* 123* 127* 135*  BUN 44* 39* 26* 12  CREATININE 2.65* 1.99* 0.67 0.56  CALCIUM 8.2* 8.4* 8.0* 8.4*  MG 1.8  --  1.9 1.8   Liver Function Tests: Recent Labs  Lab 06/20/21 0230 06/21/21 0255  AST 71* 29  ALT 123* 74*  ALKPHOS 102 84  BILITOT 1.0 1.0  PROT 6.2* 5.4*  ALBUMIN 3.3* 2.8*   CBC: Recent Labs  Lab 06/19/21 1809 06/20/21 0230 06/21/21 0255 06/22/21 0409  WBC 19.2* 9.5 6.0 6.2  NEUTROABS 18.6* 8.7*  --   --   HGB 13.7 13.0 11.0* 13.1  HCT 41.9 38.1 33.4* 38.3  MCV 95.2 91.6 92.8 89.1  PLT 98* 126* 113* 136*   CBG: Recent Labs  Lab 06/21/21 0745 06/21/21 1217 06/21/21 1646 06/21/21 2114 06/22/21 0916  GLUCAP 129* 104* 116* 143* 176*   Hgb A1c Recent Labs    06/20/21 1729  HGBA1C 6.4*   Lipid Profile No results for input(s): CHOL, HDL, LDLCALC, TRIG, CHOLHDL, LDLDIRECT in the last 72 hours. Thyroid function studies No results for input(s): TSH, T4TOTAL, T3FREE, THYROIDAB in the last 72 hours.  Invalid input(s): FREET3 Urinalysis    Component Value Date/Time   COLORURINE YELLOW 06/19/2021 2130   APPEARANCEUR HAZY (A)  06/19/2021 2130   LABSPEC 1.016 06/19/2021 2130    PHURINE 5.0 06/19/2021 2130   GLUCOSEU NEGATIVE 06/19/2021 2130   HGBUR LARGE (A) 06/19/2021 2130   [place] NEGATIVE 06/19/2021 2130   [place] NEGATIVE 06/19/2021 2130   PROTEINUR 30 (A) 06/19/2021 2130   NITRITE NEGATIVE 06/19/2021 2130   LEUKOCYTESUR MODERATE (A) 06/19/2021 2130    FURTHER DISCHARGE INSTRUCTIONS:   Get Medicines reviewed and adjusted: Please take all your medications with you for your next visit with your Primary MD   Laboratory/radiological data: Please request your Primary MD to go over all hospital tests and procedure/radiological results at the follow up, please ask your Primary MD to get all Hospital records sent to his/her office.   In some cases, they will be blood work, cultures and biopsy results pending at the time of your discharge. Please request that your primary care M.D. goes through all the records of your hospital data and follows up on these results.   Also Note the following: If you experience worsening of your admission symptoms, develop shortness of breath, life threatening emergency, suicidal or homicidal thoughts you must seek medical attention immediately by calling 911 or calling your MD immediately  if symptoms less severe.   You must read complete instructions/literature along with all the possible adverse reactions/side effects for all the Medicines you take and that have been prescribed to you. Take any new Medicines after you have completely understood and accpet all the possible adverse reactions/side effects.    Do not drive when taking Pain medications or sleeping medications (Benzodaizepines)   Do not take more than prescribed Pain, Sleep and Anxiety Medications. It is not advisable to combine anxiety,sleep and pain medications without talking with your primary care practitioner   Special Instructions: If you have smoked or chewed Tobacco  in the last 2 yrs please stop smoking, stop any regular Alcohol  and or any Recreational  drug use.   Wear Seat belts while driving.   Please note: You were cared for by a hospitalist during your hospital stay. Once you are discharged, your primary care physician will handle any further medical issues. Please note that NO REFILLS for any discharge medications will be authorized once you are discharged, as it is imperative that you return to your primary care physician (or establish a relationship with a primary care physician if you do not have one) for your post hospital discharge needs so that they can reassess your need for medications and monitor your lab values.  Time coordinating discharge: 40 minutes  SIGNED:  Marzetta Board, MD, PhD 06/22/2021, 10:39 AM

## 2021-06-23 LAB — URINE CULTURE: Culture: 20000 — AB

## 2021-06-27 ENCOUNTER — Encounter: Payer: Self-pay | Admitting: Urology

## 2021-06-27 ENCOUNTER — Ambulatory Visit (INDEPENDENT_AMBULATORY_CARE_PROVIDER_SITE_OTHER): Payer: Medicare Other | Admitting: Urology

## 2021-06-27 VITALS — BP 98/62 | HR 76

## 2021-06-27 DIAGNOSIS — N390 Urinary tract infection, site not specified: Secondary | ICD-10-CM | POA: Diagnosis not present

## 2021-06-27 DIAGNOSIS — N201 Calculus of ureter: Secondary | ICD-10-CM

## 2021-06-27 LAB — URINALYSIS, ROUTINE W REFLEX MICROSCOPIC
Bilirubin, UA: NEGATIVE
Glucose, UA: NEGATIVE
Nitrite, UA: NEGATIVE
Specific Gravity, UA: 1.015 (ref 1.005–1.030)
Urobilinogen, Ur: 1 mg/dL (ref 0.2–1.0)
pH, UA: 7 (ref 5.0–7.5)

## 2021-06-27 LAB — MICROSCOPIC EXAMINATION
Epithelial Cells (non renal): >10 /hpf — AB (ref 0–10)
RBC, Urine: >30 /hpf — AB (ref 0–2)
Renal Epithel, UA: NONE SEEN /hpf

## 2021-06-27 MED ORDER — CIPROFLOXACIN HCL 500 MG PO TABS: 500.0000 mg | ORAL_TABLET | Freq: Every day | ORAL | 0 refills | Status: AC

## 2021-06-27 MED ORDER — PROMETHAZINE HCL 12.5 MG PO TABS: 12.5000 mg | ORAL_TABLET | Freq: Four times a day (QID) | ORAL | 0 refills | Status: DC | PRN

## 2021-06-27 MED ORDER — HYDROCODONE-ACETAMINOPHEN 5-325 MG PO TABS: 1.0000 | ORAL_TABLET | Freq: Four times a day (QID) | ORAL | 0 refills | Status: AC | PRN

## 2021-06-27 NOTE — H&P (View-Only) (Signed)
Assessment: 1. Ureteral stone   2. Urinary tract infection without hematuria, site unspecified     Plan: I reviewed the documentation from the patient's recent hospitalization including imaging results and laboratory results. Continue antibiotics for UTI.  Prescription for Cipro 500 mg daily sent to her pharmacy. Refill of pain medication and nausea medicine sent. Continue left ureteral stent until definitive treatment of the left ureteral calculus has been accomplished. Options for management of the left ureteral calculus discussed including shockwave lithotripsy or ureteroscopic stone manipulation with laser lithotripsy.  Risk and benefits of each treatment discussed.  Following our discussion, she would like to proceed with left ureteroscopic stone manipulation with laser lithotripsy.  I advised her that we will need to completely treat her UTI before proceeding with treatment of her stone.  Procedure: The patient will be scheduled for cystoscopy, bilateral retrograde pyelograms, left ureteroscopic stone manipulation with laser lithotripsy, and insertion of left ureteral stent at [facility].  Surgical request is placed with the surgery schedulers and will be scheduled at the patient's/family request. Informed consent is given as documented below. Anesthesia: General  The patient does not have sleep apnea, history of MRSA, history of VRE, history of cardiac device requiring special anesthetic needs. Patient is stable and considered clear for surgical in an outpatient ambulatory surgery setting as well as patient hospital setting.  Consent for Operation or Procedure: Provider Certification I hereby certify that the nature, purpose, benefits, usual and most frequent risks of, and alternatives to, the operation or procedure have been explained to the patient (or person authorized to sign for the patient) either by me as responsible physician or by the provider who is to perform the operation or  procedure. Time spent such that the patient/family has had an opportunity to ask questions, and that those questions have been answered. The patient or the patient's representative has been advised that selected tasks may be performed by assistants to the primary health care provider(s). I believe that the patient (or person authorized to sign for the patient) understands what has been explained, and has consented to the operation or procedure. No guarantees were implied or made.   Chief Complaint:  Chief Complaint  Patient presents with   ureteral calculus     History of Present Illness:  Kimberly Flowers is a 80 y.o. year old female who is seen for further evaluation of left ureteral calculus.  She had acute onset of left-sided flank pain on 06/18/21.  She also had associated nausea and vomiting.  He has had subjective chills.  She was seen by her PCP and sent for CT imaging.  CT imaging from 06/18/2021 showed a 9 mm calculus in the left proximal ureter with mild left hydronephrosis.  She has taken several doses of Vicodin for pain control.  She is tolerating water but has not had any solid food other than crackers.  She currently feels dizzy and lightheaded.  She is not having any dysuria or gross hematuria.  Her pain is controlled at the present time. She was diagnosed with a left-sided stone in May 2022.  CT imaging at that time showed a 5 mm calculus at the left UPJ with mild obstructive changes.  She is not aware of passing a stone. KUB from 06/19/21 showed a 4 x 9 mm calcification to the left of L3 transverse process consistent with a proximal ureteral calculus. She developed hypotension while in the office on 06/19/2021 and was urgently taken to the emergency room.  She was diagnosed with sepsis and transferred to [facility] for management.  Urine culture grew 20 K colonies of Klebsiella.  She was treated with appropriate antibiotics.  She underwent cystoscopy with stone dislodgment and  stent insertion on 06/20/2021.  She was discharged from the hospital on 06/22/2021. She returns today for follow-up.  She is having some stent related symptoms.  She continues to feel weak with some nausea.  She is tolerating p.o. liquids.  She is taking pain medication periodically.  No recent fevers or chills.  She continues on her antibiotics prescribed at discharge.  She is on Plavix for coronary artery disease and cardiac stents.  She has discontinued this for prior surgical procedures.  Past Medical History:  Past Medical History:  Diagnosis Date   Anemia    Coronary atherosclerosis of native coronary artery    Previous PCI 2007-2008, Dr. Sharyon Cable   Essential hypertension, benign    History of kidney stones    Mixed hyperlipidemia    PAD (peripheral artery disease) ([place])     Absent right DP   Palpitations    Type 2 diabetes mellitus ([place])    Vasomotor rhinitis     Past Surgical History:  Past Surgical History:  Procedure Laterality Date   ABDOMINAL HYSTERECTOMY     APPENDECTOMY     BREAST LUMPECTOMY WITH RADIOACTIVE SEED LOCALIZATION Right 04/16/2021   Procedure: RIGHT BREAST LUMPECTOMY WITH RADIOACTIVE SEED LOCALIZATION;  Surgeon: Stark Klein, MD;  Location: [place];  Service: General;  Laterality: Right;   BREAST SURGERY     CARDIAC CATHETERIZATION N/A 08/17/2015   Procedure: Left Heart Cath and Coronary Angiography;  Surgeon: Burnell Blanks, MD;  Location: [place] [facility];  Service: Cardiovascular;  Laterality: N/A;   CHOLECYSTECTOMY     CORONARY ARTERY BYPASS GRAFT  2006   [name], single vessel   CORONARY CTO INTERVENTION N/A 08/15/2020   Procedure: CORONARY CTO INTERVENTION;  Surgeon: [name], [name], MD;  Location: [place] [facility];  Service: Cardiovascular;  Laterality: N/A;   CORONARY STENT INTERVENTION N/A 08/15/2020   Procedure: CORONARY STENT INTERVENTION;  Surgeon: [name], [name], MD;  Location: [place] [facility];  Service: Cardiovascular;   Laterality: N/A;   CORONARY STENT INTERVENTION N/A 10/18/2020   Procedure: CORONARY STENT INTERVENTION;  Surgeon: [name], [name], MD;  Location: [place] [facility];  Service: Cardiovascular;  Laterality: N/A;   CORONARY STENT INTERVENTION  10/18/2020   CYSTOSCOPY W/ URETERAL STENT PLACEMENT Left 06/20/2021   Procedure: CYSTOSCOPY WITH RETROGRADE PYELOGRAM/URETERAL STENT PLACEMENT;  Surgeon: Bjorn Loser, MD;  Location: [facility];  Service: Urology;  Laterality: Left;   INTRAVASCULAR ULTRASOUND/IVUS N/A 08/15/2020   Procedure: Intravascular Ultrasound/IVUS;  Surgeon: [name], [name], MD;  Location: [place] [facility];  Service: Cardiovascular;  Laterality: N/A;   INTRAVASCULAR ULTRASOUND/IVUS N/A 10/18/2020   Procedure: Intravascular Ultrasound/IVUS;  Surgeon: [name], [name], MD;  Location: [place] [facility];  Service: Cardiovascular;  Laterality: N/A;   LEFT HEART CATH AND CORONARY ANGIOGRAPHY N/A 07/10/2020   Procedure: LEFT HEART CATH AND CORONARY ANGIOGRAPHY;  Surgeon: [name], MD;  Location: [place] [facility];  Service: Cardiovascular;  Laterality: N/A;    Allergies:  Allergies  Allergen Reactions   Ranolazine Er Other (See Comments)    Dizziness    Sulfonamide Derivatives     Unknown    Family History:  Family History  Problem Relation Age of Onset   Cancer Sister  Breast   CAD Other        Family history    Social History:  Social History   Tobacco Use   Smoking status: Never   Smokeless tobacco: Never  Vaping Use   Vaping Use: Never used  Substance Use Topics   Alcohol use: No    Alcohol/week: 0.0 standard drinks   Drug use: No    Review of symptoms:  Constitutional:  Negative for unexplained weight loss ENT:  Negative for nose bleeds, sinus pain, painful swallowing CV:  Negative for chest pain, shortness of breath, exercise intolerance, palpitations, loss of consciousness Resp:  Negative for cough, wheezing, shortness of breath GI:   Negative for diarrhea, bloody stools GU:  Positives noted in HPI; otherwise negative for  urinary incontinence Neuro:  Negative for seizures, limb weakness, slurred speech Psych:  Negative for lack of energy, depression, anxiety Endocrine:  Negative for polydipsia, polyuria, symptoms of hypoglycemia (dizziness, hunger, sweating) Hematologic:  Negative for anemia, purpura, petechia, prolonged or excessive bleeding, use of anticoagulants  Allergic:  Negative for difficulty breathing or choking as a result of exposure to anything; no shellfish allergy; no allergic response (rash/itch) to materials, foods  Physical exam: BP 98/62   Pulse 76  GENERAL APPEARANCE:  Well appearing, well developed, well nourished, NAD HEENT: Atraumatic, Normocephalic, oropharynx clear. NECK: Supple without lymphadenopathy or thyromegaly. LUNGS: Clear to auscultation bilaterally. HEART: Regular Rate and Rhythm without murmurs, gallops, or rubs. ABDOMEN: Soft, non-tender, No Masses. EXTREMITIES: Moves all extremities well.  Without clubbing, cyanosis, or edema. NEUROLOGIC:  Alert and oriented x 3, normal gait, CN II-XII grossly intact.  MENTAL STATUS:  Appropriate. BACK:  Non-tender to palpation.  No CVAT SKIN:  Warm, dry and intact.    Results: Results for orders placed or performed in visit on 06/27/21 (from the past 24 hour(s))  Urinalysis, Routine w reflex microscopic     Status: Abnormal   Collection Time: 06/27/21 11:11 AM  Result Value Ref Range   Specific Gravity, UA 1.015 1.005 - 1.030   pH, UA 7.0 5.0 - 7.5   Color, UA Amber (A) Yellow   Appearance Ur Cloudy (A) Clear   Leukocytes,UA 1+ (A) Negative   Protein,UA 2+ (A) Negative/Trace   Glucose, UA Negative Negative   Ketones, UA Trace (A) Negative   RBC, UA 3+ (A) Negative   Bilirubin, UA Negative Negative   Urobilinogen, Ur 1.0 0.2 - 1.0 mg/dL   Nitrite, UA Negative Negative   Microscopic Examination See below:    Narrative   Performed at:   [facility] [address] [place], [place]  [id number] Lab Director: Mina Marble MT, Phone:  [phone]  Microscopic Examination     Status: Abnormal   Collection Time: 06/27/21 11:11 AM   Urine  Result Value Ref Range   WBC, UA 6-10 (A) 0 - 5 /hpf   RBC >30 (A) 0 - 2 /hpf   Epithelial Cells (non renal) >10 (A) 0 - 10 /hpf   Renal Epithel, UA None seen None seen /hpf   Mucus, UA Present Not Estab.   Bacteria, UA Moderate (A) None seen/Few   Narrative   Performed at:  [facility] [address] [place], [place]  [id number] Lab Director: [place], Phone:  [phone]

## 2021-06-27 NOTE — Progress Notes (Signed)
Urological Symptom Review  Patient is experiencing the following symptoms: Frequent urination Burning/pain with urination Get up at night to urinate Leakage of urine Blood in urine   Review of Systems  Gastrointestinal (upper)  : Nausea  Gastrointestinal (lower) : Constipation  Constitutional : Negative for symptoms  Skin: Negative for skin symptoms  Eyes: Negative for eye symptoms  Ear/Nose/Throat : Negative for Ear/Nose/Throat symptoms  Hematologic/Lymphatic: Negative for Hematologic/Lymphatic symptoms  Cardiovascular : Negative for cardiovascular symptoms  Respiratory : Negative for respiratory symptoms  Endocrine: Negative for endocrine symptoms  Musculoskeletal: Negative for musculoskeletal symptoms  Neurological: Negative for neurological symptoms  Psychologic: Negative for psychiatric symptoms

## 2021-06-27 NOTE — Progress Notes (Signed)
Assessment: 1. Ureteral stone   2. Urinary tract infection without hematuria, site unspecified     Plan: I reviewed the documentation from the patient's recent hospitalization including imaging results and laboratory results. Continue antibiotics for UTI.  Prescription for Cipro 500 mg daily sent to her pharmacy. Refill of pain medication and nausea medicine sent. Continue left ureteral stent until definitive treatment of the left ureteral calculus has been accomplished. Options for management of the left ureteral calculus discussed including shockwave lithotripsy or ureteroscopic stone manipulation with laser lithotripsy.  Risk and benefits of each treatment discussed.  Following our discussion, she would like to proceed with left ureteroscopic stone manipulation with laser lithotripsy.  I advised her that we will need to completely treat her UTI before proceeding with treatment of her stone.  Procedure: The patient will be scheduled for cystoscopy, bilateral retrograde pyelograms, left ureteroscopic stone manipulation with laser lithotripsy, and insertion of left ureteral stent at [facility].  Surgical request is placed with the surgery schedulers and will be scheduled at the patient's/family request. Informed consent is given as documented below. Anesthesia: General  The patient does not have sleep apnea, history of MRSA, history of VRE, history of cardiac device requiring special anesthetic needs. Patient is stable and considered clear for surgical in an outpatient ambulatory surgery setting as well as patient hospital setting.  Consent for Operation or Procedure: Provider Certification I hereby certify that the nature, purpose, benefits, usual and most frequent risks of, and alternatives to, the operation or procedure have been explained to the patient (or person authorized to sign for the patient) either by me as responsible physician or by the provider who is to perform the operation or  procedure. Time spent such that the patient/family has had an opportunity to ask questions, and that those questions have been answered. The patient or the patient's representative has been advised that selected tasks may be performed by assistants to the primary health care provider(s). I believe that the patient (or person authorized to sign for the patient) understands what has been explained, and has consented to the operation or procedure. No guarantees were implied or made.   Chief Complaint:  Chief Complaint  Patient presents with   ureteral calculus     History of Present Illness:  Kimberly Flowers is a 80 y.o. year old female who is seen for further evaluation of left ureteral calculus.  She had acute onset of left-sided flank pain on 06/18/21.  She also had associated nausea and vomiting.  He has had subjective chills.  She was seen by her PCP and sent for CT imaging.  CT imaging from 06/18/2021 showed a 9 mm calculus in the left proximal ureter with mild left hydronephrosis.  She has taken several doses of Vicodin for pain control.  She is tolerating water but has not had any solid food other than crackers.  She currently feels dizzy and lightheaded.  She is not having any dysuria or gross hematuria.  Her pain is controlled at the present time. She was diagnosed with a left-sided stone in May 2022.  CT imaging at that time showed a 5 mm calculus at the left UPJ with mild obstructive changes.  She is not aware of passing a stone. KUB from 06/19/21 showed a 4 x 9 mm calcification to the left of L3 transverse process consistent with a proximal ureteral calculus. She developed hypotension while in the office on 06/19/2021 and was urgently taken to the emergency room.  She was diagnosed with sepsis and transferred to [facility] for management.  Urine culture grew 20 K colonies of Klebsiella.  She was treated with appropriate antibiotics.  She underwent cystoscopy with stone dislodgment and  stent insertion on 06/20/2021.  She was discharged from the hospital on 06/22/2021. She returns today for follow-up.  She is having some stent related symptoms.  She continues to feel weak with some nausea.  She is tolerating p.o. liquids.  She is taking pain medication periodically.  No recent fevers or chills.  She continues on her antibiotics prescribed at discharge.  She is on Plavix for coronary artery disease and cardiac stents.  She has discontinued this for prior surgical procedures.  Past Medical History:  Past Medical History:  Diagnosis Date   Anemia    Coronary atherosclerosis of native coronary artery    Previous PCI 2007-2008, Dr. Sharyon Cable   Essential hypertension, benign    History of kidney stones    Mixed hyperlipidemia    PAD (peripheral artery disease) ([place])     Absent right DP   Palpitations    Type 2 diabetes mellitus ([place])    Vasomotor rhinitis     Past Surgical History:  Past Surgical History:  Procedure Laterality Date   ABDOMINAL HYSTERECTOMY     APPENDECTOMY     BREAST LUMPECTOMY WITH RADIOACTIVE SEED LOCALIZATION Right 04/16/2021   Procedure: RIGHT BREAST LUMPECTOMY WITH RADIOACTIVE SEED LOCALIZATION;  Surgeon: Stark Klein, MD;  Location: [place];  Service: General;  Laterality: Right;   BREAST SURGERY     CARDIAC CATHETERIZATION N/A 08/17/2015   Procedure: Left Heart Cath and Coronary Angiography;  Surgeon: Burnell Blanks, MD;  Location: [place] [facility];  Service: Cardiovascular;  Laterality: N/A;   CHOLECYSTECTOMY     CORONARY ARTERY BYPASS GRAFT  2006   [name], single vessel   CORONARY CTO INTERVENTION N/A 08/15/2020   Procedure: CORONARY CTO INTERVENTION;  Surgeon: [name], [name], MD;  Location: [place] [facility];  Service: Cardiovascular;  Laterality: N/A;   CORONARY STENT INTERVENTION N/A 08/15/2020   Procedure: CORONARY STENT INTERVENTION;  Surgeon: [name], [name], MD;  Location: [place] [facility];  Service: Cardiovascular;   Laterality: N/A;   CORONARY STENT INTERVENTION N/A 10/18/2020   Procedure: CORONARY STENT INTERVENTION;  Surgeon: [name], [name], MD;  Location: [place] [facility];  Service: Cardiovascular;  Laterality: N/A;   CORONARY STENT INTERVENTION  10/18/2020   CYSTOSCOPY W/ URETERAL STENT PLACEMENT Left 06/20/2021   Procedure: CYSTOSCOPY WITH RETROGRADE PYELOGRAM/URETERAL STENT PLACEMENT;  Surgeon: Bjorn Loser, MD;  Location: [facility];  Service: Urology;  Laterality: Left;   INTRAVASCULAR ULTRASOUND/IVUS N/A 08/15/2020   Procedure: Intravascular Ultrasound/IVUS;  Surgeon: [name], [name], MD;  Location: [place] [facility];  Service: Cardiovascular;  Laterality: N/A;   INTRAVASCULAR ULTRASOUND/IVUS N/A 10/18/2020   Procedure: Intravascular Ultrasound/IVUS;  Surgeon: [name], [name], MD;  Location: [place] [facility];  Service: Cardiovascular;  Laterality: N/A;   LEFT HEART CATH AND CORONARY ANGIOGRAPHY N/A 07/10/2020   Procedure: LEFT HEART CATH AND CORONARY ANGIOGRAPHY;  Surgeon: [name], MD;  Location: [place] [facility];  Service: Cardiovascular;  Laterality: N/A;    Allergies:  Allergies  Allergen Reactions   Ranolazine Er Other (See Comments)    Dizziness    Sulfonamide Derivatives     Unknown    Family History:  Family History  Problem Relation Age of Onset   Cancer Sister  Breast   CAD Other        Family history    Social History:  Social History   Tobacco Use   Smoking status: Never   Smokeless tobacco: Never  Vaping Use   Vaping Use: Never used  Substance Use Topics   Alcohol use: No    Alcohol/week: 0.0 standard drinks   Drug use: No    Review of symptoms:  Constitutional:  Negative for unexplained weight loss ENT:  Negative for nose bleeds, sinus pain, painful swallowing CV:  Negative for chest pain, shortness of breath, exercise intolerance, palpitations, loss of consciousness Resp:  Negative for cough, wheezing, shortness of breath GI:   Negative for diarrhea, bloody stools GU:  Positives noted in HPI; otherwise negative for  urinary incontinence Neuro:  Negative for seizures, limb weakness, slurred speech Psych:  Negative for lack of energy, depression, anxiety Endocrine:  Negative for polydipsia, polyuria, symptoms of hypoglycemia (dizziness, hunger, sweating) Hematologic:  Negative for anemia, purpura, petechia, prolonged or excessive bleeding, use of anticoagulants  Allergic:  Negative for difficulty breathing or choking as a result of exposure to anything; no shellfish allergy; no allergic response (rash/itch) to materials, foods  Physical exam: BP 98/62   Pulse 76  GENERAL APPEARANCE:  Well appearing, well developed, well nourished, NAD HEENT: Atraumatic, Normocephalic, oropharynx clear. NECK: Supple without lymphadenopathy or thyromegaly. LUNGS: Clear to auscultation bilaterally. HEART: Regular Rate and Rhythm without murmurs, gallops, or rubs. ABDOMEN: Soft, non-tender, No Masses. EXTREMITIES: Moves all extremities well.  Without clubbing, cyanosis, or edema. NEUROLOGIC:  Alert and oriented x 3, normal gait, CN II-XII grossly intact.  MENTAL STATUS:  Appropriate. BACK:  Non-tender to palpation.  No CVAT SKIN:  Warm, dry and intact.    Results: Results for orders placed or performed in visit on 06/27/21 (from the past 24 hour(s))  Urinalysis, Routine w reflex microscopic     Status: Abnormal   Collection Time: 06/27/21 11:11 AM  Result Value Ref Range   Specific Gravity, UA 1.015 1.005 - 1.030   pH, UA 7.0 5.0 - 7.5   Color, UA Amber (A) Yellow   Appearance Ur Cloudy (A) Clear   Leukocytes,UA 1+ (A) Negative   Protein,UA 2+ (A) Negative/Trace   Glucose, UA Negative Negative   Ketones, UA Trace (A) Negative   RBC, UA 3+ (A) Negative   Bilirubin, UA Negative Negative   Urobilinogen, Ur 1.0 0.2 - 1.0 mg/dL   Nitrite, UA Negative Negative   Microscopic Examination See below:    Narrative   Performed at:   [facility] [address], [place], [place]  [id number] Lab Director: Mina Marble MT, Phone:  [phone]  Microscopic Examination     Status: Abnormal   Collection Time: 06/27/21 11:11 AM   Urine  Result Value Ref Range   WBC, UA 6-10 (A) 0 - 5 /hpf   RBC >30 (A) 0 - 2 /hpf   Epithelial Cells (non renal) >10 (A) 0 - 10 /hpf   Renal Epithel, UA None seen None seen /hpf   Mucus, UA Present Not Estab.   Bacteria, UA Moderate (A) None seen/Few   Narrative   Performed at:  [facility] [address], [place], [place]  [id number] Lab Director: Thurman, Phone:  [phone]

## 2021-06-29 ENCOUNTER — Other Ambulatory Visit: Payer: Self-pay | Admitting: Cardiology

## 2021-07-02 NOTE — Patient Instructions (Addendum)
Kimberly Flowers  07/02/2021     @PREFPERIOPPHARMACY @   Your procedure is scheduled on  07/05/2021.   Report to [facility] at  0800 A.M.   Call this number if you have problems the morning of surgery:  [phone]   Remember:  Do not eat or drink after midnight.    Your last dose of plavix should have been 06/29/2021.      Take these medicines the morning of surgery with A SIP OF WATER      amlodipine, allegra, hydrocodone (if needed), isosorbide, nebivolol, protonix.     Do not wear jewelry, make-up or nail polish.  Do not wear lotions, powders, or perfumes, or deodorant.  Do not shave 48 hours prior to surgery.  Men may shave face and neck.  Do not bring valuables to the hospital.  [facility] is not responsible for any belongings or valuables.  Contacts, dentures or bridgework may not be worn into surgery.  Leave your suitcase in the car.  After surgery it may be brought to your room.  For patients admitted to the hospital, discharge time will be determined by your treatment team.  Patients discharged the day of surgery will not be allowed to drive home and must have someone with them for 24 hours.    Special instructions:   DO NOT smoke tobacco or vape for 24 hours before your procedure.  Please read over the following fact sheets that you were given. Coughing and Deep Breathing, Anesthesia Post-op Instructions, and Care and Recovery After Surgery      Ureteral Stent Implantation, Care After This sheet gives you information about how to care for yourself after your procedure. Your health care provider may also give you more specific instructions. If you have problems or questions, contact your health care provider. What can I expect after the procedure? After the procedure, it is common to have: Nausea. Mild pain when you urinate. You may feel this pain in your lower back or lower abdomen. The pain should stop within a few minutes after you  urinate. This may last for up to 1 week. A small amount of blood in your urine for several days. Follow these instructions at home: Medicines Take over-the-counter and prescription medicines only as told by your health care provider. If you were prescribed an antibiotic medicine, take it as told by your health care provider. Do not stop taking the antibiotic even if you start to feel better. Do not drive for 24 hours if you were given a sedative during your procedure. Ask your health care provider if the medicine prescribed to you requires you to avoid driving or using heavy machinery. Activity Rest as told by your health care provider. Avoid sitting for a long time without moving. Get up to take short walks every 1-2 hours. This is important to improve blood flow and breathing. Ask for help if you feel weak or unsteady. Return to your normal activities as told by your health care provider. Ask your health care provider what activities are safe for you. General instructions  Watch for any blood in your urine. Call your health care provider if the amount of blood in your urine increases. If you have a catheter: Follow instructions from your health care provider about taking care of your catheter and collection bag. Do not take baths, swim, or use a hot tub until your health care provider approves. Ask your health care provider if  you may take showers. You may only be allowed to take sponge baths. Drink enough fluid to keep your urine pale yellow. Do not use any products that contain nicotine or tobacco, such as cigarettes, e-cigarettes, and chewing tobacco. These can delay healing after surgery. If you need help quitting, ask your health care provider. Keep all follow-up visits as told by your health care provider. This is important. Contact a health care provider if: You have pain that gets worse or does not get better with medicine, especially pain when you urinate. You have difficulty  urinating. You feel nauseous or you vomit repeatedly during a period of more than 2 days after the procedure. Get help right away if: Your urine is dark red or has blood clots in it. You are leaking urine (have incontinence). The end of the stent comes out of your urethra. You cannot urinate. You have sudden, sharp, or severe pain in your abdomen or lower back. You have a fever. You have swelling or pain in your legs. You have difficulty breathing. Summary After the procedure, it is common to have mild pain when you urinate that goes away within a few minutes after you urinate. This may last for up to 1 week. Watch for any blood in your urine. Call your health care provider if the amount of blood in your urine increases. Take over-the-counter and prescription medicines only as told by your health care provider. Drink enough fluid to keep your urine pale yellow. This information is not intended to replace advice given to you by your health care provider. Make sure you discuss any questions you have with your health care provider. Document Revised: 06/01/2018 Document Reviewed: 06/02/2018 Elsevier Patient Education  2022 [place] Anesthesia, Adult, Care After This sheet gives you information about how to care for yourself after your procedure. Your health care provider may also give you more specific instructions. If you have problems or questions, contact your health care provider. What can I expect after the procedure? After the procedure, the following side effects are common: Pain or discomfort at the IV site. Nausea. Vomiting. Sore throat. Trouble concentrating. Feeling cold or chills. Feeling weak or tired. Sleepiness and fatigue. Soreness and body aches. These side effects can affect parts of the body that were not involved in surgery. Follow these instructions at home: For the time period you were told by your health care provider:  Rest. Do not participate in  activities where you could fall or become injured. Do not drive or use machinery. Do not drink alcohol. Do not take sleeping pills or medicines that cause drowsiness. Do not make important decisions or sign legal documents. Do not take care of children on your own. Eating and drinking Follow any instructions from your health care provider about eating or drinking restrictions. When you feel hungry, start by eating small amounts of foods that are soft and easy to digest (bland), such as toast. Gradually return to your regular diet. Drink enough fluid to keep your urine pale yellow. If you vomit, rehydrate by drinking water, juice, or clear broth. General instructions If you have sleep apnea, surgery and certain medicines can increase your risk for breathing problems. Follow instructions from your health care provider about wearing your sleep device: Anytime you are sleeping, including during daytime naps. While taking prescription pain medicines, sleeping medicines, or medicines that make you drowsy. Have a responsible adult stay with you for the time you are told. It is important to have  someone help care for you until you are awake and alert. Return to your normal activities as told by your health care provider. Ask your health care provider what activities are safe for you. Take over-the-counter and prescription medicines only as told by your health care provider. If you smoke, do not smoke without supervision. Keep all follow-up visits as told by your health care provider. This is important. Contact a health care provider if: You have nausea or vomiting that does not get better with medicine. You cannot eat or drink without vomiting. You have pain that does not get better with medicine. You are unable to pass urine. You develop a skin rash. You have a fever. You have redness around your IV site that gets worse. Get help right away if: You have difficulty breathing. You have chest  pain. You have blood in your urine or stool, or you vomit blood. Summary After the procedure, it is common to have a sore throat or nausea. It is also common to feel tired. Have a responsible adult stay with you for the time you are told. It is important to have someone help care for you until you are awake and alert. When you feel hungry, start by eating small amounts of foods that are soft and easy to digest (bland), such as toast. Gradually return to your regular diet. Drink enough fluid to keep your urine pale yellow. Return to your normal activities as told by your health care provider. Ask your health care provider what activities are safe for you. This information is not intended to replace advice given to you by your health care provider. Make sure you discuss any questions you have with your health care provider. Document Revised: 05/10/2020 Document Reviewed: 12/08/2019 Elsevier Patient Education  2022 [place]. How to Use Chlorhexidine for Bathing Chlorhexidine gluconate (CHG) is a germ-killing (antiseptic) solution that is used to clean the skin. It can get rid of the bacteria that normally live on the skin and can keep them away for about 24 hours. To clean your skin with CHG, you may be given: A CHG solution to use in the shower or as part of a sponge bath. A prepackaged cloth that contains CHG. Cleaning your skin with CHG may help lower the risk for infection: While you are staying in the intensive care unit of the hospital. If you have a vascular access, such as a central line, to provide short-term or long-term access to your veins. If you have a catheter to drain urine from your bladder. If you are on a ventilator. A ventilator is a machine that helps you breathe by moving air in and out of your lungs. After surgery. What are the risks? Risks of using CHG include: A skin reaction. Hearing loss, if CHG gets in your ears and you have a perforated eardrum. Eye injury, if  CHG gets in your eyes and is not rinsed out. The CHG product catching fire. Make sure that you avoid smoking and flames after applying CHG to your skin. Do not use CHG: If you have a chlorhexidine allergy or have previously reacted to chlorhexidine. On babies younger than 78 months of age. How to use CHG solution Use CHG only as told by your health care provider, and follow the instructions on the label. Use the full amount of CHG as directed. Usually, this is one bottle. During a shower Follow these steps when using CHG solution during a shower (unless your health care provider gives you  different instructions): Start the shower. Use your normal soap and shampoo to wash your face and hair. Turn off the shower or move out of the shower stream. Pour the CHG onto a clean washcloth. Do not use any type of brush or rough-edged sponge. Starting at your neck, lather your body down to your toes. Make sure you follow these instructions: If you will be having surgery, pay special attention to the part of your body where you will be having surgery. Scrub this area for at least 1 minute. Do not use CHG on your head or face. If the solution gets into your ears or eyes, rinse them well with water. Avoid your genital area. Avoid any areas of skin that have broken skin, cuts, or scrapes. Scrub your back and under your arms. Make sure to wash skin folds. Let the lather sit on your skin for 1-2 minutes or as long as told by your health care provider. Thoroughly rinse your entire body in the shower. Make sure that all body creases and crevices are rinsed well. Dry off with a clean towel. Do not put any substances on your body afterward--such as powder, lotion, or perfume--unless you are told to do so by your health care provider. Only use lotions that are recommended by the manufacturer. Put on clean clothes or pajamas. If it is the night before your surgery, sleep in clean sheets.  During a sponge  bath Follow these steps when using CHG solution during a sponge bath (unless your health care provider gives you different instructions): Use your normal soap and shampoo to wash your face and hair. Pour the CHG onto a clean washcloth. Starting at your neck, lather your body down to your toes. Make sure you follow these instructions: If you will be having surgery, pay special attention to the part of your body where you will be having surgery. Scrub this area for at least 1 minute. Do not use CHG on your head or face. If the solution gets into your ears or eyes, rinse them well with water. Avoid your genital area. Avoid any areas of skin that have broken skin, cuts, or scrapes. Scrub your back and under your arms. Make sure to wash skin folds. Let the lather sit on your skin for 1-2 minutes or as long as told by your health care provider. Using a different clean, wet washcloth, thoroughly rinse your entire body. Make sure that all body creases and crevices are rinsed well. Dry off with a clean towel. Do not put any substances on your body afterward--such as powder, lotion, or perfume--unless you are told to do so by your health care provider. Only use lotions that are recommended by the manufacturer. Put on clean clothes or pajamas. If it is the night before your surgery, sleep in clean sheets. How to use CHG prepackaged cloths Only use CHG cloths as told by your health care provider, and follow the instructions on the label. Use the CHG cloth on clean, dry skin. Do not use the CHG cloth on your head or face unless your health care provider tells you to. When washing with the CHG cloth: Avoid your genital area. Avoid any areas of skin that have broken skin, cuts, or scrapes. Before surgery Follow these steps when using a CHG cloth to clean before surgery (unless your health care provider gives you different instructions): Using the CHG cloth, vigorously scrub the part of your body where you  will be having surgery. Scrub using a  back-and-forth motion for 3 minutes. The area on your body should be completely wet with CHG when you are done scrubbing. Do not rinse. Discard the cloth and let the area air-dry. Do not put any substances on the area afterward, such as powder, lotion, or perfume. Put on clean clothes or pajamas. If it is the night before your surgery, sleep in clean sheets.  For general bathing Follow these steps when using CHG cloths for general bathing (unless your health care provider gives you different instructions). Use a separate CHG cloth for each area of your body. Make sure you wash between any folds of skin and between your fingers and toes. Wash your body in the following order, switching to a new cloth after each step: The front of your neck, shoulders, and chest. Both of your arms, under your arms, and your hands. Your stomach and groin area, avoiding the genitals. Your right leg and foot. Your left leg and foot. The back of your neck, your back, and your buttocks. Do not rinse. Discard the cloth and let the area air-dry. Do not put any substances on your body afterward--such as powder, lotion, or perfume--unless you are told to do so by your health care provider. Only use lotions that are recommended by the manufacturer. Put on clean clothes or pajamas. Contact a health care provider if: Your skin gets irritated after scrubbing. You have questions about using your solution or cloth. You swallow any chlorhexidine. Call your local poison control center (1-(331)250-9465 in the [place] Get help right away if: Your eyes itch badly, or they become very red or swollen. Your skin itches badly and is red or swollen. Your hearing changes. You have trouble seeing. You have swelling or tingling in your mouth or throat. You have trouble breathing. These symptoms may represent a serious problem that is an emergency. Do not wait to see if the symptoms will go away. Get  medical help right away. Call your local emergency services (911 in the [place] Do not drive yourself to the hospital. Summary Chlorhexidine gluconate (CHG) is a germ-killing (antiseptic) solution that is used to clean the skin. Cleaning your skin with CHG may help to lower your risk for infection. You may be given CHG to use for bathing. It may be in a bottle or in a prepackaged cloth to use on your skin. Carefully follow your health care provider's instructions and the instructions on the product label. Do not use CHG if you have a chlorhexidine allergy. Contact your health care provider if your skin gets irritated after scrubbing. This information is not intended to replace advice given to you by your health care provider. Make sure you discuss any questions you have with your health care provider. Document Revised: 11/05/2020 Document Reviewed: 11/05/2020 Elsevier Patient Education  2022 [employer].

## 2021-07-03 ENCOUNTER — Encounter (HOSPITAL_COMMUNITY): Payer: Self-pay

## 2021-07-03 ENCOUNTER — Encounter (HOSPITAL_COMMUNITY)
Admission: RE | Admit: 2021-07-03 | Discharge: 2021-07-03 | Disposition: A | Discharge status: Home / Self Care | Payer: Medicare Other | Source: Ambulatory Visit | Attending: Urology | Admitting: Urology

## 2021-07-03 NOTE — Pre-Procedure Instructions (Signed)
Consent changed to match booking per Gibson Ramp, RN.

## 2021-07-05 ENCOUNTER — Ambulatory Visit (HOSPITAL_COMMUNITY)
Admission: RE | Admit: 2021-07-05 | Discharge: 2021-07-05 | Disposition: A | Discharge status: Home / Self Care | Payer: Medicare Other | Attending: Urology | Admitting: Urology

## 2021-07-05 ENCOUNTER — Ambulatory Visit (HOSPITAL_COMMUNITY): Payer: Medicare Other | Admitting: Anesthesiology

## 2021-07-05 ENCOUNTER — Encounter (HOSPITAL_COMMUNITY): Payer: Self-pay | Admitting: Urology

## 2021-07-05 ENCOUNTER — Encounter (HOSPITAL_COMMUNITY)
Admission: RE | Disposition: A | Discharge status: Home / Self Care | Payer: Self-pay | Source: Home / Self Care | Attending: Urology

## 2021-07-05 ENCOUNTER — Ambulatory Visit (HOSPITAL_COMMUNITY): Payer: Medicare Other

## 2021-07-05 DIAGNOSIS — N201 Calculus of ureter: Secondary | ICD-10-CM

## 2021-07-05 DIAGNOSIS — Z955 Presence of coronary angioplasty implant and graft: Secondary | ICD-10-CM | POA: Insufficient documentation

## 2021-07-05 DIAGNOSIS — I2510 Atherosclerotic heart disease of native coronary artery without angina pectoris: Secondary | ICD-10-CM | POA: Insufficient documentation

## 2021-07-05 DIAGNOSIS — N200 Calculus of kidney: Secondary | ICD-10-CM | POA: Diagnosis not present

## 2021-07-05 DIAGNOSIS — Z7902 Long term (current) use of antithrombotics/antiplatelets: Secondary | ICD-10-CM | POA: Insufficient documentation

## 2021-07-05 DIAGNOSIS — Z951 Presence of aortocoronary bypass graft: Secondary | ICD-10-CM | POA: Diagnosis not present

## 2021-07-05 DIAGNOSIS — Z87448 Personal history of other diseases of urinary system: Secondary | ICD-10-CM | POA: Diagnosis not present

## 2021-07-05 DIAGNOSIS — Z87440 Personal history of urinary (tract) infections: Secondary | ICD-10-CM | POA: Diagnosis not present

## 2021-07-05 DIAGNOSIS — Z8249 Family history of ischemic heart disease and other diseases of the circulatory system: Secondary | ICD-10-CM | POA: Insufficient documentation

## 2021-07-05 DIAGNOSIS — N136 Pyonephrosis: Secondary | ICD-10-CM | POA: Diagnosis present

## 2021-07-05 DIAGNOSIS — E1151 Type 2 diabetes mellitus with diabetic peripheral angiopathy without gangrene: Secondary | ICD-10-CM | POA: Diagnosis not present

## 2021-07-05 HISTORY — PX: CYSTOSCOPY WITH RETROGRADE PYELOGRAM, URETEROSCOPY AND STENT PLACEMENT: SHX5789

## 2021-07-05 HISTORY — PX: HOLMIUM LASER APPLICATION: SHX5852

## 2021-07-05 HISTORY — PX: STONE EXTRACTION WITH BASKET: SHX5318

## 2021-07-05 LAB — GLUCOSE, CAPILLARY: Glucose-Capillary: 147 mg/dL — ABNORMAL HIGH (ref 70–99)

## 2021-07-05 SURGERY — CYSTOURETEROSCOPY, WITH RETROGRADE PYELOGRAM AND STENT INSERTION
Anesthesia: General | Site: Ureter | Laterality: Left

## 2021-07-05 MED ORDER — PHENAZOPYRIDINE HCL 200 MG PO TABS: 200.0000 mg | ORAL_TABLET | Freq: Three times a day (TID) | ORAL | 0 refills | Status: DC | PRN

## 2021-07-05 MED ORDER — FENTANYL CITRATE PF 50 MCG/ML IJ SOSY: 25.0000 ug | PREFILLED_SYRINGE | INTRAMUSCULAR | Status: DC | PRN

## 2021-07-05 MED ORDER — ONDANSETRON HCL 4 MG/2ML IJ SOLN: 4.0000 mg | Freq: Once | INTRAMUSCULAR | Status: DC | PRN

## 2021-07-05 MED ORDER — CHLORHEXIDINE GLUCONATE 0.12 % MT SOLN
OROMUCOSAL | Status: AC
  Administered 2021-07-05: 15 mL via OROMUCOSAL
  Filled 2021-07-05: qty 15

## 2021-07-05 MED ORDER — LIDOCAINE HCL URETHRAL/MUCOSAL 2 % EX GEL
CUTANEOUS | Status: AC
  Filled 2021-07-05: qty 10

## 2021-07-05 MED ORDER — CHLORHEXIDINE GLUCONATE 0.12 % MT SOLN: 15.0000 mL | Freq: Once | OROMUCOSAL | Status: AC

## 2021-07-05 MED ORDER — LIDOCAINE HCL (PF) 2 % IJ SOLN
INTRAMUSCULAR | Status: AC
  Filled 2021-07-05: qty 5

## 2021-07-05 MED ORDER — SODIUM CHLORIDE 0.9 % IR SOLN
Status: DC | PRN
  Administered 2021-07-05: 3000 mL

## 2021-07-05 MED ORDER — ORAL CARE MOUTH RINSE: 15.0000 mL | Freq: Once | OROMUCOSAL | Status: AC

## 2021-07-05 MED ORDER — DEXAMETHASONE SODIUM PHOSPHATE 4 MG/ML IJ SOLN
INTRAMUSCULAR | Status: DC | PRN
  Administered 2021-07-05: 8 mg via INTRAVENOUS

## 2021-07-05 MED ORDER — FENTANYL CITRATE (PF) 100 MCG/2ML IJ SOLN
INTRAMUSCULAR | Status: AC
  Filled 2021-07-05: qty 2

## 2021-07-05 MED ORDER — CEFAZOLIN SODIUM-DEXTROSE 2-4 GM/100ML-% IV SOLN
2.0000 g | INTRAVENOUS | Status: AC
  Administered 2021-07-05: 2 g via INTRAVENOUS

## 2021-07-05 MED ORDER — FENTANYL CITRATE (PF) 100 MCG/2ML IJ SOLN
INTRAMUSCULAR | Status: DC | PRN
  Administered 2021-07-05 (×2): 50 ug via INTRAVENOUS

## 2021-07-05 MED ORDER — PROPOFOL 10 MG/ML IV BOLUS
INTRAVENOUS | Status: DC | PRN
  Administered 2021-07-05: 150 mg via INTRAVENOUS
  Administered 2021-07-05 (×2): 50 mg via INTRAVENOUS

## 2021-07-05 MED ORDER — EPHEDRINE 5 MG/ML INJ
INTRAVENOUS | Status: AC
  Filled 2021-07-05: qty 5

## 2021-07-05 MED ORDER — CIPROFLOXACIN HCL 500 MG PO TABS: 500.0000 mg | ORAL_TABLET | Freq: Two times a day (BID) | ORAL | 0 refills | Status: AC

## 2021-07-05 MED ORDER — PROPOFOL 10 MG/ML IV BOLUS
INTRAVENOUS | Status: AC
  Filled 2021-07-05: qty 20

## 2021-07-05 MED ORDER — DIATRIZOATE MEGLUMINE 30 % UR SOLN
URETHRAL | Status: DC | PRN
  Administered 2021-07-05: 15 mL via URETHRAL

## 2021-07-05 MED ORDER — LIDOCAINE HCL URETHRAL/MUCOSAL 2 % EX GEL
CUTANEOUS | Status: DC | PRN
  Administered 2021-07-05: 1 via URETHRAL

## 2021-07-05 MED ORDER — LACTATED RINGERS IV SOLN: INTRAVENOUS | Status: DC

## 2021-07-05 MED ORDER — DEXAMETHASONE SODIUM PHOSPHATE 10 MG/ML IJ SOLN
INTRAMUSCULAR | Status: AC
  Filled 2021-07-05: qty 1

## 2021-07-05 MED ORDER — CEFAZOLIN SODIUM-DEXTROSE 2-4 GM/100ML-% IV SOLN
INTRAVENOUS | Status: AC
  Filled 2021-07-05: qty 100

## 2021-07-05 MED ORDER — ONDANSETRON HCL 4 MG/2ML IJ SOLN
INTRAMUSCULAR | Status: DC | PRN
Start: 2021-07-05 — End: 2021-07-05
  Administered 2021-07-05: 4 mg via INTRAVENOUS

## 2021-07-05 MED ORDER — LIDOCAINE HCL (CARDIAC) PF 100 MG/5ML IV SOSY
PREFILLED_SYRINGE | INTRAVENOUS | Status: DC | PRN
  Administered 2021-07-05: 100 mg via INTRAVENOUS

## 2021-07-05 MED ORDER — DIATRIZOATE MEGLUMINE 30 % UR SOLN
URETHRAL | Status: AC
  Filled 2021-07-05: qty 100

## 2021-07-05 MED ORDER — WATER FOR IRRIGATION, STERILE IR SOLN
Status: DC | PRN
  Administered 2021-07-05: 1000 mL

## 2021-07-05 MED ORDER — EPHEDRINE SULFATE 50 MG/ML IJ SOLN
INTRAMUSCULAR | Status: DC | PRN
  Administered 2021-07-05 (×2): 5 mg via INTRAVENOUS

## 2021-07-05 MED ORDER — ONDANSETRON HCL 4 MG/2ML IJ SOLN
INTRAMUSCULAR | Status: AC
  Filled 2021-07-05: qty 2

## 2021-07-05 SURGICAL SUPPLY — 22 items
BAG DRAIN URO TABLE W/ADPT NS (BAG) ×3 IMPLANT
BAG HAMPER (MISCELLANEOUS) ×3 IMPLANT
CATH URET 5FR 28IN OPEN ENDED (CATHETERS) ×3 IMPLANT
CLOTH BEACON ORANGE TIMEOUT ST (SAFETY) ×3 IMPLANT
EXTRACTOR STONE NITINOL NGAGE (UROLOGICAL SUPPLIES) ×1 IMPLANT
GLOVE SURG POLYISO LF SZ8 (GLOVE) ×3 IMPLANT
GLOVE SURG UNDER POLY LF SZ7 (GLOVE) ×6 IMPLANT
GOWN STRL REUS W/TWL LRG LVL3 (GOWN DISPOSABLE) ×3 IMPLANT
GOWN STRL REUS W/TWL XL LVL3 (GOWN DISPOSABLE) ×3 IMPLANT
GUIDEWIRE STR DUAL SENSOR (WIRE) ×3 IMPLANT
IV NS IRRIG 3000ML ARTHROMATIC (IV SOLUTION) ×6 IMPLANT
KIT TURNOVER CYSTO (KITS) ×3 IMPLANT
MANIFOLD NEPTUNE II (INSTRUMENTS) ×3 IMPLANT
PACK CYSTO (CUSTOM PROCEDURE TRAY) ×3 IMPLANT
PAD ARMBOARD 7.5X6 YLW CONV (MISCELLANEOUS) ×3 IMPLANT
SHEATH URETERAL FLEX 12X35 (SHEATH) ×1 IMPLANT
STENT URET 6FRX26 CONTOUR (STENTS) ×1 IMPLANT
SYR CONTROL 10ML LL (SYRINGE) ×3 IMPLANT
TOWEL OR 17X26 4PK STRL BLUE (TOWEL DISPOSABLE) ×3 IMPLANT
TRACTIP FLEXIVA PULS ID 200XHI (Laser) IMPLANT
TRACTIP FLEXIVA PULSE ID 200 (Laser) ×1
WATER STERILE IRR 500ML POUR (IV SOLUTION) ×3 IMPLANT

## 2021-07-05 NOTE — Anesthesia Procedure Notes (Signed)
Procedure Name: LMA Insertion Date/Time: 07/05/2021 9:05 AM Performed by: Tacy Learn, CRNA Pre-anesthesia Checklist: Patient identified, Emergency Drugs available, Suction available, Patient being monitored and Timeout performed Patient Re-evaluated:Patient Re-evaluated prior to induction Oxygen Delivery Method: Circle system utilized Preoxygenation: Pre-oxygenation with 100% oxygen Induction Type: IV induction LMA: LMA inserted LMA Size: 3.0 Number of attempts: 1 Placement Confirmation: positive ETCO2, CO2 detector and breath sounds checked- equal and bilateral Tube secured with: Tape Dental Injury: Teeth and Oropharynx as per pre-operative assessment

## 2021-07-05 NOTE — Op Note (Signed)
OPERATIVE NOTE   Patient Name: Kimberly Flowers   Date of Procedure: 07/05/21   Preoperative diagnosis:  Left ureteral calculus History of UTI  Postoperative diagnosis:  Left renal calculus History of UTI  Procedure:  Cystoscopy Bilateral retrograde pyelograms Left ureteroscopy with holmium laser lithotripsy, Stone manipulation Insertion of left ureteral stent (6 [place] by 26 cm with tether)  Attending: Primus Bravo, MD  Anesthesia: General  Estimated blood loss: 0 mL  Fluids: Per anesthesia record  Drains: 6 French by 26 cm double-J stent on the left, with tether  Specimens: Stone fragments  Antibiotics: Ancef 2 g IV  Findings: Normal urethra and bladder with cystocele; normal right retrograde pyelogram; migration of left ureteral calculus into left lower pole calyx  Indications:  80 year old female who presented with a left proximal ureteral calculus found on CT imaging for evaluation of left-sided flank pain with associated nausea and vomiting.  CT imaging showed a 9 mm calculus in the left proximal ureter and mild left hydronephrosis.  KUB from 06/19/2021 showed a 4 x 9 mm calcification to the left of the L3 transverse process consistent with a proximal ureteral calculus.  She developed hypotension and [facility]  Urine culture grew 20 K colonies of Klebsiella.  She underwent cystoscopy with stone dislodgment and stent insertion on 06/20/2021.  She presents now for definitive management of the left ureteral calculus.  She continues with a left ureteral stent.  She presents for cystoscopy, bilateral retrograde pyelograms, left ureteroscopy, possible laser lithotripsy, possible stone manipulation, and insertion of left ureteral stent.  Risk and benefits of the procedure have been discussed in detail.  She understands and wishes to proceed as described.  Description of Procedure:  The patient received IV Ancef preoperatively.  After successful  induction of a general anesthetic, the patient was placed in the dorsolithotomy position.  The patient's genital area was prepped and draped in sterile fashion.  Under direct visualization, a 105 French rigid scope was passed through the urethra into the bladder.  No urethral or bladder abnormalities were seen.  The left double-J stent was seen emanating from the left ureteral orifice.  Using stent graspers, the stent was brought to the urethral meatus.  A sensor guidewire was then passed through the stent into the left renal pelvis under fluoroscopic guidance.  Bilateral retrograde pyelograms were performed evaluation of the patient's upper tracts given her history of nephrolithiasis.  Scout film showed a nonspecific bowel gas pattern and no obvious bony abnormalities.  A calcification was seen in the lower left renal shadow consistent with the ureteral calculus previously in the proximal left ureter.  The left ureteral stent was noted to be in place.  This was exchanged for a guidewire during the procedure.  An open-ended catheter was passed over the guidewire into the mid left ureter.  Injection of contrast demonstrated no obvious filling defect within the left mid or proximal ureter.  The previously noted calculus was confirmed to be with in a lower pole calyx.  The right retrograde pyelogram showed no filling defects or evidence of obstruction.  The left ureter was gently dilated using a access sheath.  Ureteroscopy was performed alongside the guidewire.  The left ureter was normal in appearance.  No stones were seen within the left ureter all the way to the left UPJ.  A Flexor access sheath was then placed leaving the guidewire in place.  Flexible ureteroscopy using the digital ureteroscope was performed through the sheath  and alongside the guidewire.  The stone was identified in a lower pole calyx on the left.  Using the holmium laser fiber, the stone was completely fragmented.  Fragmentation resulted in  multiple small stone pieces.  A stone basket was used to retrieve several of the larger stone fragments for stone analysis.  All remaining fragments were felt to be easily passable.  The ureteroscope and sheath were removed.  Inspection of the ureter demonstrated no injury.  A 6 French by 26 cm double-J stent was then passed over the guidewire into the left renal pelvis.  Position was confirmed with fluoroscopy.  The tether was left attached and brought through the meatus.  The bladder was then drained and the cystoscope was removed.  The patient received intraurethral lidocaine jelly prior to the end of the procedure.  She was extubated and taken to the postanesthesia care unit in stable condition.  Complications: None  Condition: Stable, extubated, transferred to PACU  Plan:  Continue stent for approximately 1 week. Follow-up in the office for stent removal and KUB.

## 2021-07-05 NOTE — Interval H&P Note (Signed)
History and Physical Interval Note:  07/05/2021 8:35 AM  Kimberly Flowers  has presented today for surgery, with the diagnosis of left ureteral calculus.  The various methods of treatment have been discussed with the patient and family. After consideration of risks, benefits and other options for treatment, the patient has consented to  Procedure(s): CYSTOSCOPY WITH RETROGRADE PYELOGRAM, URETEROSCOPY AND STENT EXCHANGE (Left) HOLMIUM LASER APPLICATION (Left) as a surgical intervention.  The patient's history has been reviewed, patient examined, no change in status, stable for surgery.  I have reviewed the patient's chart and labs.  Questions were answered to the patient's satisfaction.     Michaelle Birks

## 2021-07-05 NOTE — Anesthesia Postprocedure Evaluation (Signed)
Anesthesia Post Note  Patient: Kimberly Flowers  Procedure(s) Performed: CYSTOSCOPY WITH BILATERAL RETROGRADE PYELOGRAM, LEFT URETEROSCOPY AND LEFT STENT EXCHANGE (Bilateral: Ureter) HOLMIUM LASER APPLICATION (Left: Renal) STONE EXTRACTION WITH BASKET (Left: Renal)  Patient location during evaluation: PACU Anesthesia Type: General Level of consciousness: awake and alert and oriented Pain management: pain level controlled Vital Signs Assessment: post-procedure vital signs reviewed and stable Respiratory status: spontaneous breathing, nonlabored ventilation and respiratory function stable Cardiovascular status: blood pressure returned to baseline and stable Postop Assessment: no apparent nausea or vomiting Anesthetic complications: no   No notable events documented.   Last Vitals:  Vitals:   07/05/21 1010 07/05/21 1045  BP: 118/60 123/65  Pulse: 60 65  Resp: 14 14  Temp: 36.9 C 36.8 C  SpO2: 92% 96%    Last Pain:  Vitals:   07/05/21 1045  TempSrc: Oral  PainSc: 0-No pain                 Reymundo Winship C Jenai Scaletta

## 2021-07-05 NOTE — Transfer of Care (Signed)
Immediate Anesthesia Transfer of Care Note  Patient: Kimberly Flowers  Procedure(s) Performed: CYSTOSCOPY WITH BILATERAL RETROGRADE PYELOGRAM, LEFT URETEROSCOPY AND LEFT STENT EXCHANGE (Bilateral: Ureter) HOLMIUM LASER APPLICATION (Left: Renal) STONE EXTRACTION WITH BASKET (Left: Renal)  Patient Location: PACU  Anesthesia Type:General  Level of Consciousness: awake, alert , oriented, patient cooperative and responds to stimulation  Airway & Oxygen Therapy: Patient Spontanous Breathing  Post-op Assessment: Report given to RN, Post -op Vital signs reviewed and stable and Patient moving all extremities X 4  Post vital signs: Reviewed and stable  Last Vitals:  Vitals Value Taken Time  BP 118/60 07/05/21 1008  Temp    Pulse 68 07/05/21 1010  Resp 17 07/05/21 1010  SpO2 98 % 07/05/21 1010  Vitals shown include unvalidated device data.  Last Pain:  Vitals:   07/05/21 0816  TempSrc: Oral  PainSc: 0-No pain      Patients Stated Pain Goal: 5 (12/45/80 9983)  Complications: No notable events documented.

## 2021-07-05 NOTE — Anesthesia Preprocedure Evaluation (Signed)
Anesthesia Evaluation  Patient identified by MRN, date of birth, ID band Patient awake    Reviewed: Allergy & Precautions, NPO status , Patient's Chart, lab work & pertinent test results  Airway Mallampati: II  TM Distance: >3 FB Neck ROM: Full    Dental  (+) Dental Advisory Given, Teeth Intact   Pulmonary neg pulmonary ROS,    Pulmonary exam normal breath sounds clear to auscultation       Cardiovascular Exercise Tolerance: Good hypertension, Pt. on medications + angina + CAD, + CABG and + Peripheral Vascular Disease  Normal cardiovascular exam Rhythm:Regular Rate:Normal     Neuro/Psych negative neurological ROS  negative psych ROS   GI/Hepatic negative GI ROS, Neg liver ROS,   Endo/Other  diabetes, Well Controlled, Type 2, Oral Hypoglycemic Agents  Renal/GU Renal InsufficiencyRenal disease     Musculoskeletal negative musculoskeletal ROS (+)   Abdominal   Peds  Hematology  (+) anemia ,   Anesthesia Other Findings   Reproductive/Obstetrics negative OB ROS                           Anesthesia Physical Anesthesia Plan  ASA: 3  Anesthesia Plan: General   Post-op Pain Management:    Induction: Intravenous  PONV Risk Score and Plan: Ondansetron  Airway Management Planned: LMA  Additional Equipment:   Intra-op Plan:   Post-operative Plan: Extubation in OR  Informed Consent: I have reviewed the patients History and Physical, chart, labs and discussed the procedure including the risks, benefits and alternatives for the proposed anesthesia with the patient or authorized representative who has indicated his/her understanding and acceptance.     Dental advisory given  Plan Discussed with: CRNA and Surgeon  Anesthesia Plan Comments:        Anesthesia Quick Evaluation

## 2021-07-08 ENCOUNTER — Encounter (HOSPITAL_COMMUNITY): Payer: Self-pay | Admitting: Urology

## 2021-07-10 LAB — CALCULI, WITH PHOTOGRAPH (CLINICAL LAB)
Calcium Oxalate Dihydrate: 20 %
Calcium Oxalate Monohydrate: 70 %
Hydroxyapatite: 10 %
Weight Calculi: 5 mg

## 2021-07-11 ENCOUNTER — Ambulatory Visit (INDEPENDENT_AMBULATORY_CARE_PROVIDER_SITE_OTHER): Payer: Medicare Other | Admitting: Urology

## 2021-07-11 ENCOUNTER — Encounter: Payer: Self-pay | Admitting: Urology

## 2021-07-11 ENCOUNTER — Other Ambulatory Visit: Payer: Self-pay

## 2021-07-11 VITALS — BP 121/66 | HR 65

## 2021-07-11 DIAGNOSIS — N201 Calculus of ureter: Secondary | ICD-10-CM | POA: Diagnosis not present

## 2021-07-11 LAB — MICROSCOPIC EXAMINATION
Bacteria, UA: NONE SEEN
Epithelial Cells (non renal): NONE SEEN /hpf (ref 0–10)
RBC, Urine: >30 /hpf — AB (ref 0–2)
Renal Epithel, UA: NONE SEEN /hpf

## 2021-07-11 LAB — URINALYSIS, ROUTINE W REFLEX MICROSCOPIC
Bilirubin, UA: NEGATIVE
Glucose, UA: NEGATIVE
Nitrite, UA: NEGATIVE
Specific Gravity, UA: 1.02 (ref 1.005–1.030)
Urobilinogen, Ur: 2 mg/dL — ABNORMAL HIGH (ref 0.2–1.0)
pH, UA: 5.5 (ref 5.0–7.5)

## 2021-07-11 NOTE — Progress Notes (Signed)
Assessment: 1. Ureteral stone, left     Plan: Stent removed today via tether Stone prevention discussed Return to office in 1 month  Chief Complaint: Chief Complaint  Patient presents with   Nephrolithiasis     HPI: Kimberly Flowers is a 80 y.o. female who presents for continued evaluation of  left ureteral calculus.  She had acute onset of left-sided flank pain on 06/18/21.  She also had associated nausea and vomiting. And subjective chills.  She was seen by her PCP and sent for CT imaging.  CT imaging from 06/18/2021 showed a 9 mm calculus in the left proximal ureter with mild left hydronephrosis.   She developed hypotension while in the office on 06/19/2021 and was urgently taken to the emergency room.  She was diagnosed with sepsis and transferred to [facility] for management.  Urine culture grew 20 K colonies of Klebsiella.  She was treated with appropriate antibiotics.  She underwent cystoscopy with stone dislodgment and stent insertion on 06/20/2021.  She was discharged from the hospital on 06/22/2021. KUB from 06/19/21 showed a 4 x 9 mm calcification to the left of L3 transverse process consistent with a proximal ureteral calculus.   She is status post left ureteroscopic laser lithotripsy on 07/05/2021.  Her stone was removed at the time of the procedure.  She returns today for follow-up.  She continues to have some stent related symptoms.  She completed her antibiotics this morning.  No fevers or chills. Stone analysis: 90% calcium oxalate, 10% hydroxyapatite   Portions of the above documentation were copied from a prior visit for review purposes only.  Allergies: Allergies  Allergen Reactions   Ranolazine Er Other (See Comments)    Dizziness    Sulfonamide Derivatives     Unknown    PMH: Past Medical History:  Diagnosis Date   Anemia    Coronary atherosclerosis of native coronary artery    Previous PCI 2007-2008, Dr. Sharyon Cable   Essential hypertension, benign     History of kidney stones    Mixed hyperlipidemia    PAD (peripheral artery disease) ([place])     Absent right DP   Palpitations    Type 2 diabetes mellitus (HCC)    Vasomotor rhinitis     PSH: Past Surgical History:  Procedure Laterality Date   ABDOMINAL HYSTERECTOMY     APPENDECTOMY     BREAST LUMPECTOMY WITH RADIOACTIVE SEED LOCALIZATION Right 04/16/2021   Procedure: RIGHT BREAST LUMPECTOMY WITH RADIOACTIVE SEED LOCALIZATION;  Surgeon: Stark Klein, MD;  Location: [place];  Service: General;  Laterality: Right;   BREAST SURGERY     CARDIAC CATHETERIZATION N/A 08/17/2015   Procedure: Left Heart Cath and Coronary Angiography;  Surgeon: Burnell Blanks, MD;  Location: [place] [facility];  Service: Cardiovascular;  Laterality: N/A;   CHOLECYSTECTOMY     CORONARY ARTERY BYPASS GRAFT  2006   [name], single vessel   CORONARY CTO INTERVENTION N/A 08/15/2020   Procedure: CORONARY CTO INTERVENTION;  Surgeon: [name], [name], MD;  Location: [place] [facility];  Service: Cardiovascular;  Laterality: N/A;   CORONARY STENT INTERVENTION N/A 08/15/2020   Procedure: CORONARY STENT INTERVENTION;  Surgeon: [name], [name], MD;  Location: [place] [facility];  Service: Cardiovascular;  Laterality: N/A;   CORONARY STENT INTERVENTION N/A 10/18/2020   Procedure: CORONARY STENT INTERVENTION;  Surgeon: [name], [name], MD;  Location: [place] [facility];  Service: Cardiovascular;  Laterality: N/A;   CORONARY STENT INTERVENTION  10/18/2020  CYSTOSCOPY W/ URETERAL STENT PLACEMENT Left 06/20/2021   Procedure: CYSTOSCOPY WITH RETROGRADE PYELOGRAM/URETERAL STENT PLACEMENT;  Surgeon: Bjorn Loser, MD;  Location: [facility];  Service: Urology;  Laterality: Left;   CYSTOSCOPY WITH RETROGRADE PYELOGRAM, URETEROSCOPY AND STENT PLACEMENT Bilateral 07/05/2021   Procedure: CYSTOSCOPY WITH BILATERAL RETROGRADE PYELOGRAM, LEFT URETEROSCOPY AND LEFT STENT EXCHANGE;  Surgeon: Primus Bravo., MD;  Location: [facility];  Service: Urology;  Laterality: Bilateral;   HOLMIUM LASER APPLICATION Left 37/62/8315   Procedure: HOLMIUM LASER APPLICATION;  Surgeon: Primus Bravo., MD;  Location: [facility];  Service: Urology;  Laterality: Left;   INTRAVASCULAR ULTRASOUND/IVUS N/A 08/15/2020   Procedure: Intravascular Ultrasound/IVUS;  Surgeon: [name], [name], MD;  Location: [place] [facility];  Service: Cardiovascular;  Laterality: N/A;   INTRAVASCULAR ULTRASOUND/IVUS N/A 10/18/2020   Procedure: Intravascular Ultrasound/IVUS;  Surgeon: [name], [name], MD;  Location: [place] [facility];  Service: Cardiovascular;  Laterality: N/A;   LEFT HEART CATH AND CORONARY ANGIOGRAPHY N/A 07/10/2020   Procedure: LEFT HEART CATH AND CORONARY ANGIOGRAPHY;  Surgeon: [name], MD;  Location: [place] [facility];  Service: Cardiovascular;  Laterality: N/A;   STONE EXTRACTION WITH BASKET Left 07/05/2021   Procedure: STONE EXTRACTION WITH BASKET;  Surgeon: Primus Bravo., MD;  Location: [facility];  Service: Urology;  Laterality: Left;    SH: Social History   Tobacco Use   Smoking status: Never   Smokeless tobacco: Never  Vaping Use   Vaping Use: Never used  Substance Use Topics   Alcohol use: No    Alcohol/week: 0.0 standard drinks   Drug use: No    ROS: Constitutional:  Negative for fever, chills, weight loss CV: Negative for chest pain, previous MI, hypertension Respiratory:  Negative for shortness of breath, wheezing, sleep apnea, frequent cough GI:  Negative for nausea, vomiting, bloody stool, GERD  PE: BP 121/66   Pulse 65  GENERAL APPEARANCE:  Well appearing, well developed, well nourished, NAD HEENT:  Atraumatic, normocephalic, oropharynx clear NECK:  Supple without lymphadenopathy or thyromegaly ABDOMEN:  Soft, non-tender, no masses EXTREMITIES:  Moves all extremities well, without clubbing, cyanosis, or edema NEUROLOGIC:  Alert and oriented x 3, normal gait, CN II-XII grossly intact MENTAL  STATUS:  appropriate BACK:  Non-tender to palpation, No CVAT SKIN:  Warm, dry, and intact   Results: U/A:  6-10 WBC, >30 RBCs

## 2021-07-11 NOTE — Progress Notes (Signed)
Urological Symptom Review  Patient is experiencing the following symptoms: Frequent urination Get up at night to urinate Blood in urine   Review of Systems  Gastrointestinal (upper)  : Nausea  Gastrointestinal (lower) : Constipation  Constitutional : Fatigue  Skin: Negative for skin symptoms  Eyes: Negative for eye symptoms  Ear/Nose/Throat : Negative for Ear/Nose/Throat symptoms  Hematologic/Lymphatic: Negative for Hematologic/Lymphatic symptoms  Cardiovascular : Negative for cardiovascular symptoms  Respiratory : Negative for respiratory symptoms  Endocrine: Negative for endocrine symptoms  Musculoskeletal: Negative for musculoskeletal symptoms  Neurological: Negative for neurological symptoms  Psychologic: Negative for psychiatric symptoms

## 2021-07-16 ENCOUNTER — Encounter (HOSPITAL_COMMUNITY): Payer: Self-pay | Admitting: *Deleted

## 2021-07-16 ENCOUNTER — Emergency Department (HOSPITAL_COMMUNITY)
Admission: EM | Admit: 2021-07-16 | Discharge: 2021-07-16 | Disposition: A | Discharge status: Home / Self Care | Payer: Medicare Other | Attending: Emergency Medicine | Admitting: Emergency Medicine

## 2021-07-16 DIAGNOSIS — Z7984 Long term (current) use of oral hypoglycemic drugs: Secondary | ICD-10-CM | POA: Diagnosis not present

## 2021-07-16 DIAGNOSIS — R42 Dizziness and giddiness: Secondary | ICD-10-CM | POA: Diagnosis present

## 2021-07-16 DIAGNOSIS — R531 Weakness: Secondary | ICD-10-CM | POA: Diagnosis not present

## 2021-07-16 DIAGNOSIS — Z951 Presence of aortocoronary bypass graft: Secondary | ICD-10-CM | POA: Insufficient documentation

## 2021-07-16 DIAGNOSIS — Z955 Presence of coronary angioplasty implant and graft: Secondary | ICD-10-CM | POA: Diagnosis not present

## 2021-07-16 DIAGNOSIS — Z79899 Other long term (current) drug therapy: Secondary | ICD-10-CM | POA: Diagnosis not present

## 2021-07-16 DIAGNOSIS — Z7902 Long term (current) use of antithrombotics/antiplatelets: Secondary | ICD-10-CM | POA: Insufficient documentation

## 2021-07-16 DIAGNOSIS — E860 Dehydration: Secondary | ICD-10-CM | POA: Insufficient documentation

## 2021-07-16 DIAGNOSIS — I10 Essential (primary) hypertension: Secondary | ICD-10-CM | POA: Insufficient documentation

## 2021-07-16 DIAGNOSIS — I2510 Atherosclerotic heart disease of native coronary artery without angina pectoris: Secondary | ICD-10-CM | POA: Diagnosis not present

## 2021-07-16 DIAGNOSIS — E876 Hypokalemia: Secondary | ICD-10-CM | POA: Insufficient documentation

## 2021-07-16 DIAGNOSIS — E1169 Type 2 diabetes mellitus with other specified complication: Secondary | ICD-10-CM | POA: Diagnosis not present

## 2021-07-16 LAB — CBC WITH DIFFERENTIAL/PLATELET
Abs Immature Granulocytes: 0.04 10*3/uL (ref 0.00–0.07)
Basophils Absolute: 0.1 10*3/uL (ref 0.0–0.1)
Basophils Relative: 1 %
Eosinophils Absolute: 0 10*3/uL (ref 0.0–0.5)
Eosinophils Relative: 0 %
HCT: 27.5 % — ABNORMAL LOW (ref 36.0–46.0)
Hemoglobin: 9.5 g/dL — ABNORMAL LOW (ref 12.0–15.0)
Immature Granulocytes: 1 %
Lymphocytes Relative: 13 %
Lymphs Abs: 1.1 10*3/uL (ref 0.7–4.0)
MCH: 31.3 pg (ref 26.0–34.0)
MCHC: 34.5 g/dL (ref 30.0–36.0)
MCV: 90.5 fL (ref 80.0–100.0)
Monocytes Absolute: 0.6 10*3/uL (ref 0.1–1.0)
Monocytes Relative: 7 %
Neutro Abs: 6.5 10*3/uL (ref 1.7–7.7)
Neutrophils Relative %: 78 %
Platelets: 271 10*3/uL (ref 150–400)
RBC: 3.04 MIL/uL — ABNORMAL LOW (ref 3.87–5.11)
RDW: 14.2 % (ref 11.5–15.5)
WBC: 8.3 10*3/uL (ref 4.0–10.5)
nRBC: 0 % (ref 0.0–0.2)

## 2021-07-16 LAB — URINALYSIS, ROUTINE W REFLEX MICROSCOPIC
Bilirubin Urine: NEGATIVE
Glucose, UA: NEGATIVE mg/dL
Hgb urine dipstick: NEGATIVE
Ketones, ur: 5 mg/dL — AB
Nitrite: NEGATIVE
Protein, ur: 100 mg/dL — AB
Specific Gravity, Urine: 1.018 (ref 1.005–1.030)
pH: 5 (ref 5.0–8.0)

## 2021-07-16 LAB — COMPREHENSIVE METABOLIC PANEL
ALT: 21 U/L (ref 0–44)
AST: 26 U/L (ref 15–41)
Albumin: 3.5 g/dL (ref 3.5–5.0)
Alkaline Phosphatase: 74 U/L (ref 38–126)
Anion gap: 10 (ref 5–15)
BUN: 17 mg/dL (ref 8–23)
CO2: 20 mmol/L — ABNORMAL LOW (ref 22–32)
Calcium: 9.1 mg/dL (ref 8.9–10.3)
Chloride: 107 mmol/L (ref 98–111)
Creatinine, Ser: 1.29 mg/dL — ABNORMAL HIGH (ref 0.44–1.00)
GFR, Estimated: 42 mL/min — ABNORMAL LOW (ref >60)
Glucose, Bld: 193 mg/dL — ABNORMAL HIGH (ref 70–99)
Potassium: 3.1 mmol/L — ABNORMAL LOW (ref 3.5–5.1)
Sodium: 137 mmol/L (ref 135–145)
Total Bilirubin: 0.6 mg/dL (ref 0.3–1.2)
Total Protein: 6.5 g/dL (ref 6.5–8.1)

## 2021-07-16 LAB — TYPE AND SCREEN
ABO/RH(D): A NEG
Antibody Screen: NEGATIVE

## 2021-07-16 LAB — MAGNESIUM: Magnesium: 2 mg/dL (ref 1.7–2.4)

## 2021-07-16 LAB — LACTIC ACID, PLASMA
Lactic Acid, Venous: 2.5 mmol/L (ref 0.5–1.9)
Lactic Acid, Venous: 2.3 mmol/L (ref 0.5–1.9)

## 2021-07-16 MED ORDER — SODIUM CHLORIDE 0.9 % IV BOLUS
500.0000 mL | Freq: Once | INTRAVENOUS | Status: AC
  Administered 2021-07-16: 500 mL via INTRAVENOUS

## 2021-07-16 MED ORDER — POTASSIUM CHLORIDE 10 MEQ/100ML IV SOLN
10.0000 meq | INTRAVENOUS | Status: AC
  Administered 2021-07-16 (×2): 10 meq via INTRAVENOUS
  Filled 2021-07-16 (×2): qty 100

## 2021-07-16 MED ORDER — POTASSIUM CHLORIDE CRYS ER 20 MEQ PO TBCR
40.0000 meq | EXTENDED_RELEASE_TABLET | Freq: Once | ORAL | Status: AC
  Administered 2021-07-16: 40 meq via ORAL
  Filled 2021-07-16: qty 2

## 2021-07-16 MED ORDER — POTASSIUM CHLORIDE CRYS ER 20 MEQ PO TBCR: 20.0000 meq | EXTENDED_RELEASE_TABLET | Freq: Two times a day (BID) | ORAL | 0 refills | Status: DC

## 2021-07-16 NOTE — ED Triage Notes (Signed)
Dizziness onset today, black stool for the past 2 weeks. Recent 16 pound weight loss

## 2021-07-16 NOTE — ED Notes (Signed)
Date and time results received: 07/16/21 1617 (use smartphrase ".now" to insert current time)  Test: Lactic acid Critical Value: 2.5  Name of Provider Notified: Cyndi Bender, PA  Orders Received? Or Actions Taken?: NA

## 2021-07-16 NOTE — ED Notes (Signed)
Pt ambulated to BR without difficultly 

## 2021-07-16 NOTE — ED Provider Notes (Signed)
  Face-to-face evaluation   History: She presents for evaluation of dizziness described as lightheadedness with standing which improves when sitting.  She noticed today as she does at her doctor's office, for routine evaluation.  She is recovering from her recent kidney stone removal with stenting and subsequent stent removal, last week.  She denies fever, chills, cough or chest pain.  She has some nausea and has been eating less than usual recently.  She is not vomiting.  She is not having diarrhea.  She feels like she lost a lot of blood when she had her urologic procedure.  She does not see any blood in her stools.  She had to use a wheelchair to help with ambulation because of dizziness, today.  She is here with her husband who is supportive.  Physical exam: Elderly female who is alert and cooperative.  No dysarthria or aphasia.  She is able to sit up without dizziness or weakness.  Heart regular rate and rhythm without murmur lungs clear to auscultation.  Abdomen soft and nontender to palpation.  Medical screening examination/treatment/procedure(s) were conducted as a shared visit with non-physician practitioner(s) and myself.  I personally evaluated the patient during the encounter    Daleen Bo, MD 07/18/21 [phone]

## 2021-07-16 NOTE — Discharge Instructions (Signed)
Your symptoms today felt to be related to dehydration.  Your potassium level today was also low.  I have prescribed a potassium supplement for you to take.  You will need to have your potassium level rechecked,  your primary care provider can do this for you.  Please call to arrange a follow-up appointment for later this week.  As discussed, return to emergency department for any new or worsening symptoms.

## 2021-07-16 NOTE — ED Provider Notes (Signed)
[facility] Provider Note   CSN: [id number] Arrival date & time: 07/16/21  1351     History Chief Complaint  Patient presents with   Dizziness    Kimberly Flowers is a 80 y.o. female.   Dizziness Associated symptoms: weakness   Associated symptoms: no blood in stool, no chest pain, no headaches, no nausea, no palpitations, no shortness of breath and no vomiting        Kimberly Flowers is a 80 y.o. female with past medical history significant for breast CA, coronary atherosclerosis, anemia, PAD, and type 2 diabetes who who recently developed hypotension and sepsis secondary to UTI and 68mm kidney stone.  She was admitted into the hospital for her sepsis on 06/19/2021, the kidney stone was managed by Dr. Felipa Eth.  Urine culture grew Klebsiella.  She underwent cystoscopy, stone removal and stent placement on 06/20/2021  symptoms improved after treatment and she was discharged from hospital on 06/22/21. she has completed course of antibiotics but continues to have generalized weakness and dizziness upon standing since her kidney issues began.  She presents to the Emergency Department today with persistent generalized weakness and dizziness that she states has worsened x4 days.  She endorses a 16 pound weight loss since mid October.  She notes having significant hematuria in October, but none since completing her antibiotics and kidney stone removal.  No fever or chills, no chest pain or increased shortness of breath from her baseline.  She denies any abdominal pain, vomiting, diarrhea, dysuria, or NSAID use.  She was seen earlier today by PCP and advised to come to the emergency department for further evaluation.  She is currently on tamoxifen for breast cancer and has had 2 rounds of radiation.    Past Medical History:  Diagnosis Date   Anemia    Coronary atherosclerosis of native coronary artery    Previous PCI 2007-2008, Dr. Sharyon Cable   Essential hypertension,  benign    History of kidney stones    Mixed hyperlipidemia    PAD (peripheral artery disease) ([place])     Absent right DP   Palpitations    Type 2 diabetes mellitus ([employer])    Vasomotor rhinitis     Patient Active Problem List   Diagnosis Date Noted   Ureteral stone 06/27/2021   Urinary tract infection without hematuria 06/27/2021   Pyelonephritis 06/20/2021   Sepsis secondary to UTI ([place]) 06/20/2021   Septic shock due to urinary tract infection ([place]) 06/19/2021   Renal calculus, left 06/19/2021   Angina pectoris ([place]) 10/18/2020   Coronary artery disease involving native coronary artery of native heart with angina pectoris ([place])    Unstable angina ([place]) 08/16/2015   T2DM (type 2 diabetes mellitus) ([place]) 08/16/2015   Aortic regurgitation 09/20/2012   Palpitations 06/26/2009   Coronary atherosclerosis of native coronary artery 06/26/2009   Mixed hyperlipidemia 06/25/2009   Essential hypertension, benign 06/25/2009    Past Surgical History:  Procedure Laterality Date   ABDOMINAL HYSTERECTOMY     APPENDECTOMY     BREAST LUMPECTOMY WITH RADIOACTIVE SEED LOCALIZATION Right 04/16/2021   Procedure: RIGHT BREAST LUMPECTOMY WITH RADIOACTIVE SEED LOCALIZATION;  Surgeon: Stark Klein, MD;  Location: [facility];  Service: General;  Laterality: Right;   BREAST SURGERY     CARDIAC CATHETERIZATION N/A 08/17/2015   Procedure: Left Heart Cath and Coronary Angiography;  Surgeon: Burnell Blanks, MD;  Location: [facility];  Service: Cardiovascular;  Laterality: N/A;   CHOLECYSTECTOMY  CORONARY ARTERY BYPASS GRAFT  2006   [name], single vessel   CORONARY CTO INTERVENTION N/A 08/15/2020   Procedure: CORONARY CTO INTERVENTION;  Surgeon: [name], [name], MD;  Location: [facility];  Service: Cardiovascular;  Laterality: N/A;   CORONARY STENT INTERVENTION N/A 08/15/2020   Procedure: CORONARY STENT INTERVENTION;  Surgeon: [name], [name], MD;  Location: [facility];   Service: Cardiovascular;  Laterality: N/A;   CORONARY STENT INTERVENTION N/A 10/18/2020   Procedure: CORONARY STENT INTERVENTION;  Surgeon: [name], [name], MD;  Location: [facility];  Service: Cardiovascular;  Laterality: N/A;   CORONARY STENT INTERVENTION  10/18/2020   CYSTOSCOPY W/ URETERAL STENT PLACEMENT Left 06/20/2021   Procedure: CYSTOSCOPY WITH RETROGRADE PYELOGRAM/URETERAL STENT PLACEMENT;  Surgeon: Bjorn Loser, MD;  [facility];  Service: Urology;  Laterality: Left;   CYSTOSCOPY WITH RETROGRADE PYELOGRAM, URETEROSCOPY AND STENT PLACEMENT Bilateral 07/05/2021   Procedure: CYSTOSCOPY WITH BILATERAL RETROGRADE PYELOGRAM, LEFT URETEROSCOPY AND LEFT STENT EXCHANGE;  Surgeon: Primus Bravo., MD;  [facility];  Service: Urology;  Laterality: Bilateral;   HOLMIUM LASER APPLICATION Left 50/93/2671   Procedure: HOLMIUM LASER APPLICATION;  Surgeon: Primus Bravo., MD;  [facility];  Service: Urology;  Laterality: Left;   INTRAVASCULAR ULTRASOUND/IVUS N/A 08/15/2020   Procedure: Intravascular Ultrasound/IVUS;  Surgeon: [name], [name], MD;  Location: [facility];  Service: Cardiovascular;  Laterality: N/A;   INTRAVASCULAR ULTRASOUND/IVUS N/A 10/18/2020   Procedure: Intravascular Ultrasound/IVUS;  Surgeon: [name], [name], MD;  Location: [facility];  Service: Cardiovascular;  Laterality: N/A;   LEFT HEART CATH AND CORONARY ANGIOGRAPHY N/A 07/10/2020   Procedure: LEFT HEART CATH AND CORONARY ANGIOGRAPHY;  Surgeon: [name], MD;  Location: [facility];  Service: Cardiovascular;  Laterality: N/A;   STONE EXTRACTION WITH BASKET Left 07/05/2021   Procedure: STONE EXTRACTION WITH BASKET;  Surgeon: Primus Bravo., MD;  [facility];  Service: Urology;  Laterality: Left;     OB History   No obstetric history on file.     Family History  Problem Relation Age of Onset   Cancer Sister        Breast   CAD Other         Family history    Social History   Tobacco Use   Smoking status: Never   Smokeless tobacco: Never  Vaping Use   Vaping Use: Never used  Substance Use Topics   Alcohol use: No    Alcohol/week: 0.0 standard drinks   Drug use: No    Home Medications Prior to Admission medications   Medication Sig Start Date End Date Taking? Authorizing Provider  acetaminophen (TYLENOL) 500 MG tablet Take 500-1,000 mg by mouth every 6 (six) hours as needed for moderate pain.    [provider]  ALPRAZolam Duanne Moron) 0.5 MG tablet Take 0.5 mg by mouth 2 (two) times daily as needed. 07/15/21   [provider]  amLODipine (NORVASC) 10 MG tablet TAKE 1 TABLET BY MOUTH EVERY DAY 12/27/20   Arnoldo Lenis, MD  Ascorbic Acid (VITAMIN C WITH ROSE HIPS) 500 MG tablet Take 1,000 mg by mouth daily.    [provider]  aspirin 81 MG EC tablet Take 81 mg by mouth daily.    [provider]  atorvastatin (LIPITOR) 80 MG tablet Take 1 tablet (80 mg total) by mouth daily. 12/27/20 06/03/22  Arnoldo Lenis, MD  Calcium Carb-Cholecalciferol (CALCIUM PLUS VITAMIN  D3) 600-500 MG-UNIT CAPS Take 1 tablet by mouth daily.    [provider]  cholecalciferol (VITAMIN D3) 25 MCG (1000 UNIT) tablet Take 1,000 Units by mouth daily.    [provider]  clopidogrel (PLAVIX) 75 MG tablet Take 1 tablet (75 mg total) by mouth daily. 08/09/20   [name], [name], MD  docusate sodium (COLACE) 100 MG capsule Take 100 mg by mouth daily as needed for mild constipation or moderate constipation.     [provider]  eye wash (,SODIUM/POTASSIUM/SOD CHLORIDE,) SOLN Place 1 drop into both eyes 3 (three) times a week.    [provider]  ferrous gluconate (FERGON) 324 MG tablet Take 324 mg by mouth daily with breakfast.    [provider]  fexofenadine (ALLEGRA) 180 MG tablet Take 180 mg by mouth daily.    [provider]  Glucosamine-Chondroitin-MSM-D3 TABS Take  1 tablet by mouth daily.    [provider]  isosorbide mononitrate (IMDUR) 60 MG 24 hr tablet TAKE 1.5 TABLETS (90 MG TOTAL) BY MOUTH DAILY. 07/01/21   Arnoldo Lenis, MD  letrozole [facility]) 2.5 MG tablet Take 2.5 mg by mouth daily. 05/03/21 05/03/22  [provider]  losartan (COZAAR) 50 MG tablet Take 50 mg by mouth in the morning and at bedtime.    [provider]  metFORMIN (GLUCOPHAGE) 500 MG tablet Take 500 mg by mouth daily with breakfast.    [provider]  Multiple Vitamin (MULTIVITAMIN WITH MINERALS) TABS tablet Take 1 tablet by mouth daily.    [provider]  Multiple Vitamins-Minerals (PRESERVISION AREDS 2 PO) Take 1 tablet by mouth in the morning and at bedtime.    [provider]  nebivolol (BYSTOLIC) 10 MG tablet Take 1 tablet (10 mg total) by mouth daily. 07/20/20   Verta Ellen., NP  nitroGLYCERIN (NITROLINGUAL) 0.4 MG/SPRAY spray Place 1 spray under the tongue every 5 (five) minutes x 3 doses as needed for chest pain (if no relief after 3rd dose, proceed to the ED for an evaluation). 06/03/21   Arnoldo Lenis, MD  Omega-3 Fatty Acids (FISH OIL PO) Take 1 capsule by mouth daily.    [provider]  oxybutynin (DITROPAN) 5 MG tablet Take 1 tablet (5 mg total) by mouth every 8 (eight) hours as needed for bladder spasms (frequency, urgency of urination). 06/22/21   Caren Griffins, MD  pantoprazole (PROTONIX) 40 MG tablet Take 1 tablet (40 mg total) by mouth daily. 08/09/20   [name], [name], MD  phenazopyridine (PYRIDIUM) 200 MG tablet Take 1 tablet (200 mg total) by mouth 3 (three) times daily as needed for pain. 07/05/21 07/05/22  Stoneking, Reece Leader., MD  promethazine (PHENERGAN) 12.5 MG tablet Take 1 tablet (12.5 mg total) by mouth every 6 (six) hours as needed for nausea or vomiting. 06/27/21   Stoneking, Reece Leader., MD    Allergies    Ranolazine er and Sulfonamide derivatives  Review of Systems    Review of Systems  Constitutional:  Positive for appetite change and unexpected weight change. Negative for chills, fatigue and fever.  HENT:  Negative for trouble swallowing.   Eyes:  Negative for visual disturbance.  Respiratory:  Negative for cough and shortness of breath.   Cardiovascular:  Negative for chest pain and palpitations.  Gastrointestinal:  Negative for abdominal pain, blood in stool, nausea and vomiting.       Dark, tarry stools  Genitourinary:  Negative for difficulty urinating,  dysuria, flank pain, hematuria and vaginal bleeding.  Musculoskeletal:  Negative for arthralgias, back pain, myalgias, neck pain and neck stiffness.  Skin:  Negative for rash.  Neurological:  Positive for dizziness and weakness. Negative for syncope, speech difficulty, numbness and headaches.  Hematological:  Does not bruise/bleed easily.   Physical Exam Updated Vital Signs BP (!) 88/52   Pulse 79   Temp 98.9 F (37.2 C) (Oral)   Resp (!) 24   SpO2 97%   Physical Exam Vitals and nursing note reviewed. Exam conducted with a chaperone present.  Constitutional:      General: She is not in acute distress.    Appearance: Normal appearance. She is not toxic-appearing.  HENT:     Mouth/Throat:     Mouth: Mucous membranes are moist.  Eyes:     Conjunctiva/sclera: Conjunctivae normal.     Pupils: Pupils are equal, round, and reactive to light.  Cardiovascular:     Rate and Rhythm: Normal rate and regular rhythm.     Pulses: Normal pulses.  Pulmonary:     Effort: Pulmonary effort is normal.     Breath sounds: Normal breath sounds.  Chest:     Chest wall: No tenderness.  Abdominal:     General: There is no distension.     Palpations: Abdomen is soft.     Tenderness: There is no abdominal tenderness.  Genitourinary:    Rectum: Guaiac result positive. No tenderness or external hemorrhoid. Normal anal tone.     Comments: Minimal stool in rectum, but heme positive.  No palpable rectal  masses or abnml rectal tone.   Musculoskeletal:        General: Normal range of motion.     Right lower leg: No edema.     Left lower leg: No edema.  Skin:    General: Skin is warm.     Capillary Refill: Capillary refill takes less than 2 seconds.     Coloration: Skin is pale.     Findings: No rash.  Neurological:     General: No focal deficit present.     Mental Status: She is alert.     Sensory: Sensation is intact. No sensory deficit.     Motor: Motor function is intact. No weakness.     Coordination: Coordination is intact.     Comments: CN III-XII grossly intact.  Speech clear.  Moves all 4 extremities well    ED Results / Procedures / Treatments   Labs (all labs ordered are listed, but only abnormal results are displayed) Labs Reviewed  COMPREHENSIVE METABOLIC PANEL - Abnormal; Notable for the following components:      Result Value   Potassium 3.1 (*)    CO2 20 (*)    Glucose, Bld 193 (*)    Creatinine, Ser 1.29 (*)    GFR, Estimated 42 (*)    All other components within normal limits  CBC WITH DIFFERENTIAL/PLATELET - Abnormal; Notable for the following components:   RBC 3.04 (*)    Hemoglobin 9.5 (*)    HCT 27.5 (*)    All other components within normal limits  URINALYSIS, ROUTINE W REFLEX MICROSCOPIC - Abnormal; Notable for the following components:   APPearance CLOUDY (*)    Ketones, ur 5 (*)    Protein, ur 100 (*)    Leukocytes,Ua SMALL (*)    Bacteria, UA RARE (*)    Non Squamous Epithelial 0-5 (*)    All other components within normal limits  LACTIC  ACID, PLASMA - Abnormal; Notable for the following components:   Lactic Acid, Venous 2.5 (*)    All other components within normal limits  LACTIC ACID, PLASMA - Abnormal; Notable for the following components:   Lactic Acid, Venous 2.3 (*)    All other components within normal limits  URINE CULTURE  MAGNESIUM  POC OCCULT BLOOD, ED  TYPE AND SCREEN    EKG EKG Interpretation  Date/Time:  Tuesday  July 16 2021 14:08:24 EST Ventricular Rate:  90 PR Interval:  194 QRS Duration: 101 QT Interval:  375 QTC Calculation: 459 R Axis:   -19 Text Interpretation: Sinus arrhythmia LVH w/ repol abnormalities, possible ischemia Confirmed by Sherwood Gambler [phone]) on 07/16/2021 2:10:58 PM  Radiology No results found.  Procedures Procedures   Medications Ordered in ED Medications - No data to display  ED Course  I have reviewed the triage vital signs and the nursing notes.  Pertinent labs & imaging results that were available during my care of the patient were reviewed by me and considered in my medical decision making (see chart for details).   Vitals with BMI 07/16/2021 07/16/2021 07/16/2021  Height - - -  Weight - - -  BMI - - -  Systolic - 191 478  Diastolic - 61 59  Pulse 69 68 71      MDM Rules/Calculators/A&P                           Patient here for evaluation of generalized weakness and dizziness with hypotension symptoms present since mid October, worse for 4 days.  Dizziness upon position change.  Seen at PCPs office earlier today and advised to come to the emergency department for further evaluation.  On review of medical record, patient was admitted to the hospital in mid October for sepsis and hypotension secondary to urinary tract infection and 9 mm kidney stone.  She underwent stent placement of her kidney with lithotripsy for stone removal.  She completed course of antibiotics.  Denies any hematuria or dysuria symptoms at this time  On exam, patient nontoxic-appearing, skin appears pale.  Blood pressure soft at systolic of 295 she denies chest pain or significant dyspnea.  Abdomen is soft and nontender we will obtain labs, EKG and type and screen.  Imaging of abdomen felt to be of little value today given that patient does not have any acute abdominal pain or vomiting.  No fever.   Labs interpreted by me, hemoglobin 9.5 today it was 11 3 weeks ago.  There is very  little stool in the rectal vault.  No frank blood and she denies seeing any blood in her stools.  Serum creatinine elevated from baseline.  Patient admits to decreased food and fluid intake.  This is felt to be secondary to dehydration.  Will order labs and have patient drink fluids here.  Initial lactic acid is elevated at 2.5.  This felt to be secondary to dehydration.  Urinalysis is still pending but clinical suspicion for sepsis is low.  We will give IV fluids and repeat lactic acid after fluids completed.  No leukocytosis, no hypomagnesemia.  No acute ischemic changes on EKG.  Patient also seen by Dr. Eulis Foster and care plan discussed.  On recheck, patient has received IV fluids, tolerated food and oral liquids here.  She is ambulated in the department and gait is steady.  Some mild orthostatic hypotension after 1 L normal saline.  Patient will  be given additional 500 cc bolus.  No documented history of heart failure.  On repeat exam, patient reports feeling much better, hypotension now resolved and states that she would like to be discharged home.  I have offered hospital admission, but patient declines stating she feels comfortable going home and is no longer dizzy or weak.  She will continue hydration at home and agreeable to close outpatient follow-up with her PCP later this week.  Her urinalysis shows cloudy urine with 21-50 white cells and rare bacteria no nitrites.  Urine culture pending.  Patient denies any dysuria symptoms.  This is likely related to contaminant therefore will hold off on antibiotics until culture is back.  Strict return precautions were discussed, all questions were answered.  Patient appears appropriate for discharge home.  Final Clinical Impression(s) / ED Diagnoses Final diagnoses:  Dehydration  Generalized weakness  Hypokalemia    Rx / DC Orders ED Discharge Orders     None        Kem Parkinson, PA-C 07/16/21 1944    Daleen Bo, MD 07/18/21 1614

## 2021-07-18 LAB — URINE CULTURE: Culture: NO GROWTH

## 2021-07-19 ENCOUNTER — Other Ambulatory Visit: Payer: Self-pay | Admitting: Cardiology

## 2021-08-01 ENCOUNTER — Other Ambulatory Visit: Payer: Self-pay | Admitting: Cardiology

## 2021-08-07 NOTE — Progress Notes (Signed)
Assessment: 1. Ureteral stone, left   2. Abnormal urine findings     Plan: Urine culture sent today Begin Cipro 500 mg BID x 3 days. Rx sent. Schedule for renal U/S at [facility] discussed Return to office in 3 months  Chief Complaint: Chief Complaint  Patient presents with   Nephrolithiasis    HPI: Kimberly Flowers is a 80 y.o. female who presents for continued evaluation of  left ureteral calculus.  She had acute onset of left-sided flank pain on 06/18/21.  She also had associated nausea and vomiting and subjective chills.  She was seen by her PCP and sent for CT imaging.  CT imaging from 06/18/2021 showed a 9 mm calculus in the left proximal ureter with mild left hydronephrosis.   She developed hypotension while in the office on 06/19/2021 and was urgently taken to the emergency room. She was diagnosed with sepsis and transferred to [facility] for management.  Urine culture grew 20 K colonies of Klebsiella.  She was treated with appropriate antibiotics.  She underwent cystoscopy with stone dislodgment and stent insertion on 06/20/2021.  She was discharged from the hospital on 06/22/2021. KUB from 06/19/21 showed a 4 x 9 mm calcification to the left of L3 transverse process consistent with a proximal ureteral calculus.   She is status post left ureteroscopic laser lithotripsy on 07/05/2021.  Her stone was removed at the time of the procedure.   Stone analysis: 90% calcium oxalate, 10% hydroxyapatite  Her left ureteral stent was removed at her visit on 07/11/21.    She returns today for follow-up.  She reports that she has done well since stent removal.  No flank pain.  No fevers or chills.  No dysuria or gross hematuria.   Portions of the above documentation were copied from a prior visit for review purposes only.  Allergies: Allergies  Allergen Reactions   Ranolazine Er Other (See Comments)    Dizziness    Sulfonamide Derivatives     Unknown     PMH: Past Medical History:  Diagnosis Date   Anemia    Coronary atherosclerosis of native coronary artery    Previous PCI 2007-2008, Dr. Sharyon Cable   Essential hypertension, benign    History of kidney stones    Mixed hyperlipidemia    PAD (peripheral artery disease) ([place])     Absent right DP   Palpitations    Type 2 diabetes mellitus ([facility])    Vasomotor rhinitis     PSH: Past Surgical History:  Procedure Laterality Date   ABDOMINAL HYSTERECTOMY     APPENDECTOMY     BREAST LUMPECTOMY WITH RADIOACTIVE SEED LOCALIZATION Right 04/16/2021   Procedure: RIGHT BREAST LUMPECTOMY WITH RADIOACTIVE SEED LOCALIZATION;  Surgeon: Stark Klein, MD;  Location: [facility];  Service: General;  Laterality: Right;   BREAST SURGERY     CARDIAC CATHETERIZATION N/A 08/17/2015   Procedure: Left Heart Cath and Coronary Angiography;  Surgeon: Burnell Blanks, MD;  Location: [facility];  Service: Cardiovascular;  Laterality: N/A;   CHOLECYSTECTOMY     CORONARY ARTERY BYPASS GRAFT  2006   [name], single vessel   CORONARY CTO INTERVENTION N/A 08/15/2020   Procedure: CORONARY CTO INTERVENTION;  Surgeon: [name], [name], MD;  Location: [facility];  Service: Cardiovascular;  Laterality: N/A;   CORONARY STENT INTERVENTION N/A 08/15/2020   Procedure: CORONARY STENT INTERVENTION;  Surgeon: [name], [name], MD;  Location: [place];  Service: Cardiovascular;  Laterality: N/A;   CORONARY STENT INTERVENTION N/A 10/18/2020   Procedure: CORONARY STENT INTERVENTION;  Surgeon: [name], [name], MD;  Location: [place] [facility];  Service: Cardiovascular;  Laterality: N/A;   CORONARY STENT INTERVENTION  10/18/2020   CYSTOSCOPY W/ URETERAL STENT PLACEMENT Left 06/20/2021   Procedure: CYSTOSCOPY WITH RETROGRADE PYELOGRAM/URETERAL STENT PLACEMENT;  Surgeon: Bjorn Loser, MD;  [facility];  Service: Urology;  Laterality: Left;   CYSTOSCOPY WITH RETROGRADE PYELOGRAM, URETEROSCOPY AND  STENT PLACEMENT Bilateral 07/05/2021   Procedure: CYSTOSCOPY WITH BILATERAL RETROGRADE PYELOGRAM, LEFT URETEROSCOPY AND LEFT STENT EXCHANGE;  Surgeon: Primus Bravo., MD;  [facility];  Service: Urology;  Laterality: Bilateral;   HOLMIUM LASER APPLICATION Left 50/27/7412   Procedure: HOLMIUM LASER APPLICATION;  Surgeon: Primus Bravo., MD;  [facility];  Service: Urology;  Laterality: Left;   INTRAVASCULAR ULTRASOUND/IVUS N/A 08/15/2020   Procedure: Intravascular Ultrasound/IVUS;  Surgeon: [name], [name], MD;  Location: [place] [facility];  Service: Cardiovascular;  Laterality: N/A;   INTRAVASCULAR ULTRASOUND/IVUS N/A 10/18/2020   Procedure: Intravascular Ultrasound/IVUS;  Surgeon: [name], [name], MD;  Location: [place] [facility];  Service: Cardiovascular;  Laterality: N/A;   LEFT HEART CATH AND CORONARY ANGIOGRAPHY N/A 07/10/2020   Procedure: LEFT HEART CATH AND CORONARY ANGIOGRAPHY;  Surgeon: [name], MD;  Location: [facility];  Service: Cardiovascular;  Laterality: N/A;   STONE EXTRACTION WITH BASKET Left 07/05/2021   Procedure: STONE EXTRACTION WITH BASKET;  Surgeon: Primus Bravo., MD;  [facility];  Service: Urology;  Laterality: Left;    SH: Social History   Tobacco Use   Smoking status: Never   Smokeless tobacco: Never  Vaping Use   Vaping Use: Never used  Substance Use Topics   Alcohol use: No    Alcohol/week: 0.0 standard drinks   Drug use: No    ROS: Constitutional:  Negative for fever, chills, weight loss CV: Negative for chest pain, previous MI, hypertension Respiratory:  Negative for shortness of breath, wheezing, sleep apnea, frequent cough GI:  Negative for nausea, vomiting, bloody stool, GERD  PE: BP (!) 104/59   Pulse 89  GENERAL APPEARANCE:  Well appearing, well developed, well nourished, NAD HEENT:  Atraumatic, normocephalic, oropharynx clear NECK:  Supple without lymphadenopathy or thyromegaly ABDOMEN:   Soft, non-tender, no masses EXTREMITIES:  Moves all extremities well, without clubbing, cyanosis, or edema NEUROLOGIC:  Alert and oriented x 3, normal gait, CN II-XII grossly intact MENTAL STATUS:  appropriate BACK:  Non-tender to palpation, No CVAT SKIN:  Warm, dry, and intact  Results: U/A:  6-10 WBC, many bacteria, nitrite positive

## 2021-08-08 ENCOUNTER — Encounter: Payer: Self-pay | Admitting: Urology

## 2021-08-08 ENCOUNTER — Other Ambulatory Visit: Payer: Self-pay

## 2021-08-08 ENCOUNTER — Ambulatory Visit (INDEPENDENT_AMBULATORY_CARE_PROVIDER_SITE_OTHER): Payer: Medicare Other | Admitting: Urology

## 2021-08-08 VITALS — BP 104/59 | HR 89

## 2021-08-08 DIAGNOSIS — N201 Calculus of ureter: Secondary | ICD-10-CM

## 2021-08-08 DIAGNOSIS — R8290 Unspecified abnormal findings in urine: Secondary | ICD-10-CM

## 2021-08-08 LAB — MICROSCOPIC EXAMINATION
RBC, Urine: NONE SEEN /hpf (ref 0–2)
Renal Epithel, UA: NONE SEEN /hpf

## 2021-08-08 LAB — URINALYSIS, ROUTINE W REFLEX MICROSCOPIC
Bilirubin, UA: NEGATIVE
Glucose, UA: NEGATIVE
Nitrite, UA: POSITIVE — AB
RBC, UA: NEGATIVE
Specific Gravity, UA: 1.025 (ref 1.005–1.030)
Urobilinogen, Ur: 0.2 mg/dL (ref 0.2–1.0)
pH, UA: 5.5 (ref 5.0–7.5)

## 2021-08-08 MED ORDER — CIPROFLOXACIN HCL 500 MG PO TABS: 500.0000 mg | ORAL_TABLET | Freq: Two times a day (BID) | ORAL | 0 refills | Status: AC

## 2021-08-08 NOTE — Progress Notes (Signed)
Urological Symptom Review  Patient is experiencing the following symptoms: Get up at night to urinate   Review of Systems  Gastrointestinal (upper)  : Negative for upper GI symptoms  Gastrointestinal (lower) : Negative for lower GI symptoms  Constitutional : Negative for symptoms  Skin: Negative for skin symptoms  Eyes: Negative for eye symptoms  Ear/Nose/Throat : Negative for Ear/Nose/Throat symptoms  Hematologic/Lymphatic: Negative for Hematologic/Lymphatic symptoms  Cardiovascular : Negative for cardiovascular symptoms  Respiratory : Negative for respiratory symptoms  Endocrine: Negative for endocrine symptoms  Musculoskeletal: Negative for musculoskeletal symptoms  Neurological: Dizziness  Psychologic: Negative for psychiatric symptoms

## 2021-08-11 LAB — URINE CULTURE

## 2021-08-12 ENCOUNTER — Telehealth: Payer: Self-pay

## 2021-08-12 MED ORDER — CIPROFLOXACIN HCL 500 MG PO TABS: 500.0000 mg | ORAL_TABLET | Freq: Two times a day (BID) | ORAL | 0 refills | Status: AC

## 2021-08-12 NOTE — Addendum Note (Signed)
Addended by: Primus Bravo on: 08/12/2021 12:01 AM   Modules accepted: Orders

## 2021-08-12 NOTE — Telephone Encounter (Signed)
Patient called and made aware. Patient voiced understanding. 

## 2021-08-12 NOTE — Telephone Encounter (Signed)
-----   Message from Primus Bravo, MD sent at 08/12/2021 12:01 AM EST ----- Please notify patient that her urine culture did show evidence of a UTI.  The Cipro prescribed is appropriate.  I have sent in an additional 4 days of Cipro for a total of 7 days of therapy.

## 2021-08-20 ENCOUNTER — Encounter: Payer: Self-pay | Admitting: Cardiology

## 2021-08-20 ENCOUNTER — Ambulatory Visit (INDEPENDENT_AMBULATORY_CARE_PROVIDER_SITE_OTHER): Payer: Medicare Other | Admitting: Cardiology

## 2021-08-20 VITALS — BP 92/60 | HR 70 | Ht 65.5 in | Wt 166.4 lb

## 2021-08-20 DIAGNOSIS — I2510 Atherosclerotic heart disease of native coronary artery without angina pectoris: Secondary | ICD-10-CM | POA: Diagnosis not present

## 2021-08-20 DIAGNOSIS — I10 Essential (primary) hypertension: Secondary | ICD-10-CM

## 2021-08-20 DIAGNOSIS — I25118 Atherosclerotic heart disease of native coronary artery with other forms of angina pectoris: Secondary | ICD-10-CM

## 2021-08-20 DIAGNOSIS — Z23 Encounter for immunization: Secondary | ICD-10-CM | POA: Diagnosis not present

## 2021-08-20 MED ORDER — LOSARTAN POTASSIUM 50 MG PO TABS: 50.0000 mg | ORAL_TABLET | Freq: Every day | ORAL | Status: DC

## 2021-08-20 NOTE — Patient Instructions (Addendum)
Medication Instructions:  Decrease Losartan to 50mg  daily. Continue all other medications.     Labwork: none  Testing/Procedures: none  Follow-Up: 4 months   Any Other Special Instructions Will Be Listed Below (If Applicable). Nurse visit in 3 weeks for vitals - BP check.   If you need a refill on your cardiac medications before your next appointment, please call your pharmacy.

## 2021-08-20 NOTE — Progress Notes (Signed)
Clinical Summary Kimberly Flowers is a 80 y.o.female seen today for follow up of the following medical problems.      1. CAD - history of prior CABG, subsequent occlusion of LIMA-LAD graft, had LAD stent  Low risk nuclear stress test on 09/15/2017   07/2020 cath: mid LAD 65%, D3 90%, LCX patent, RCA occluded mid RPDA fills by collaterals.  -she had PCI to RCA CTO with Dr [name] 08/15/20. Recs for indefintie DAPT given extensive stents.  - 10/2020 PCI to mid LAD   - some recent chest pains - symptoms never fully resolved after last stent but less frequent -episode of chest pain yesteryday morning while driving. Sharp/pressure midchest, bilateral shoulders, bilatler jaw. +SOB, sweaty.Took NG x 2, 20 min later symptoms improved - mild episodes about once a week.  - compliant with meds - walks 30-60 minutes, no consistent chest pains  -ranexa listed as allergy, caused dizziness      - last visit we increased imdur to 90mg  daily. Imdur 90mg  caused dizziness, back down to 60mg .  - 06/2021 echo LVEF 45-50% - still with chest pains at times, about 1-2 times per month. Better with NG    2. HTN - she is compliant with meds - recent weight loss about 15 lbs, intermittent appetite decline over the last several weeks related to severe infection 06/2021 and hosptial admission    3. Breast cancer  4. Pneumonia/septic shock 06/2021 Past Medical History:  Diagnosis Date   Anemia    Coronary atherosclerosis of native coronary artery    Previous PCI 2007-2008, Dr. Sharyon Cable   Essential hypertension, benign    History of kidney stones    Mixed hyperlipidemia    PAD (peripheral artery disease) (HCC)     Absent right DP   Palpitations    Type 2 diabetes mellitus (HCC)    Vasomotor rhinitis      Allergies  Allergen Reactions   Ranolazine Er Other (See Comments)    Dizziness    Sulfonamide Derivatives     Unknown     Current Outpatient Medications  Medication Sig Dispense Refill    acetaminophen (TYLENOL) 500 MG tablet Take 500-1,000 mg by mouth every 6 (six) hours as needed for moderate pain.     ALPRAZolam (XANAX) 0.5 MG tablet Take 0.5 mg by mouth 2 (two) times daily as needed.     amLODipine (NORVASC) 10 MG tablet TAKE 1 TABLET BY MOUTH EVERY DAY 90 tablet 1   Ascorbic Acid (VITAMIN C WITH ROSE HIPS) 500 MG tablet Take 1,000 mg by mouth daily.     aspirin 81 MG EC tablet Take 81 mg by mouth daily.     atorvastatin (LIPITOR) 80 MG tablet Take 1 tablet (80 mg total) by mouth daily. 90 tablet 3   Calcium Carb-Cholecalciferol (CALCIUM PLUS VITAMIN D3) 600-500 MG-UNIT CAPS Take 1 tablet by mouth daily.     cholecalciferol (VITAMIN D3) 25 MCG (1000 UNIT) tablet Take 1,000 Units by mouth daily.     clopidogrel (PLAVIX) 75 MG tablet TAKE 1 TABLET BY MOUTH EVERY DAY 90 tablet 3   docusate sodium (COLACE) 100 MG capsule Take 100 mg by mouth daily as needed for mild constipation or moderate constipation.      eye wash (,SODIUM/POTASSIUM/SOD CHLORIDE,) SOLN Place 1 drop into both eyes 3 (three) times a week.     ferrous gluconate (FERGON) 324 MG tablet Take 324 mg by mouth daily with breakfast.     fexofenadine (  ALLEGRA) 180 MG tablet Take 180 mg by mouth daily.     Glucosamine-Chondroitin-MSM-D3 TABS Take 1 tablet by mouth daily.     isosorbide mononitrate (IMDUR) 60 MG 24 hr tablet TAKE 1.5 TABLETS (90 MG TOTAL) BY MOUTH DAILY. 135 tablet 3   letrozole (FEMARA) 2.5 MG tablet Take 2.5 mg by mouth daily.     letrozole (FEMARA) 2.5 MG tablet Take 1 tablet by mouth daily.     losartan (COZAAR) 50 MG tablet Take 50 mg by mouth in the morning and at bedtime.     metFORMIN (GLUCOPHAGE) 500 MG tablet Take 500 mg by mouth daily with breakfast.     Multiple Vitamin (MULTIVITAMIN WITH MINERALS) TABS tablet Take 1 tablet by mouth daily.     Multiple Vitamins-Minerals (PRESERVISION AREDS 2 PO) Take 1 tablet by mouth in the morning and at bedtime.     nebivolol (BYSTOLIC) 10 MG tablet Take 1  tablet (10 mg total) by mouth daily. 30 tablet 6   nitroGLYCERIN (NITROLINGUAL) 0.4 MG/SPRAY spray Place 1 spray under the tongue every 5 (five) minutes x 3 doses as needed for chest pain (if no relief after 3rd dose, proceed to the ED for an evaluation). 4.9 g 3   Omega-3 Fatty Acids (FISH OIL PO) Take 1 capsule by mouth daily.     pantoprazole (PROTONIX) 40 MG tablet TAKE 1 TABLET BY MOUTH EVERY DAY 90 tablet 3   potassium chloride SA (KLOR-CON) 20 MEQ tablet Take 1 tablet (20 mEq total) by mouth 2 (two) times daily. 14 tablet 0   No current facility-administered medications for this visit.     Past Surgical History:  Procedure Laterality Date   ABDOMINAL HYSTERECTOMY     APPENDECTOMY     BREAST LUMPECTOMY WITH RADIOACTIVE SEED LOCALIZATION Right 04/16/2021   Procedure: RIGHT BREAST LUMPECTOMY WITH RADIOACTIVE SEED LOCALIZATION;  Surgeon: Stark Klein, MD;  Location: [facility];  Service: General;  Laterality: Right;   BREAST SURGERY     CARDIAC CATHETERIZATION N/A 08/17/2015   Procedure: Left Heart Cath and Coronary Angiography;  Surgeon: Burnell Blanks, MD;  Location: [facility];  Service: Cardiovascular;  Laterality: N/A;   CHOLECYSTECTOMY     CORONARY ARTERY BYPASS GRAFT  2006   [name], single vessel   CORONARY CTO INTERVENTION N/A 08/15/2020   Procedure: CORONARY CTO INTERVENTION;  Surgeon: [name], [name], MD;  Location: [facility];  Service: Cardiovascular;  Laterality: N/A;   CORONARY STENT INTERVENTION N/A 08/15/2020   Procedure: CORONARY STENT INTERVENTION;  Surgeon: [name], [name], MD;  Location: [facility];  Service: Cardiovascular;  Laterality: N/A;   CORONARY STENT INTERVENTION N/A 10/18/2020   Procedure: CORONARY STENT INTERVENTION;  Surgeon: [name], [name], MD;  Location: [facility];  Service: Cardiovascular;  Laterality: N/A;   CORONARY STENT INTERVENTION  10/18/2020   CYSTOSCOPY W/ URETERAL STENT PLACEMENT Left 06/20/2021    Procedure: CYSTOSCOPY WITH RETROGRADE PYELOGRAM/URETERAL STENT PLACEMENT;  Surgeon: Bjorn Loser, MD;  Location: [facility];  Service: Urology;  Laterality: Left;   CYSTOSCOPY WITH RETROGRADE PYELOGRAM, URETEROSCOPY AND STENT PLACEMENT Bilateral 07/05/2021   Procedure: CYSTOSCOPY WITH BILATERAL RETROGRADE PYELOGRAM, LEFT URETEROSCOPY AND LEFT STENT EXCHANGE;  Surgeon: Primus Bravo., MD;  Location: [facility];  Service: Urology;  Laterality: Bilateral;   HOLMIUM LASER APPLICATION Left 98/92/1194   Procedure: HOLMIUM LASER APPLICATION;  Surgeon: Primus Bravo., MD;  Location[facility];  Service: Urology;  Laterality: Left;   INTRAVASCULAR  ULTRASOUND/IVUS N/A 08/15/2020   Procedure: Intravascular Ultrasound/IVUS;  Surgeon: [name], [name], MD;  Location: [facility];  Service: Cardiovascular;  Laterality: N/A;   INTRAVASCULAR ULTRASOUND/IVUS N/A 10/18/2020   Procedure: Intravascular Ultrasound/IVUS;  Surgeon: [name], [name], MD;  Location: [facility];  Service: Cardiovascular;  Laterality: N/A;   LEFT HEART CATH AND CORONARY ANGIOGRAPHY N/A 07/10/2020   Procedure: LEFT HEART CATH AND CORONARY ANGIOGRAPHY;  Surgeon: [name], MD;  Location: [facility];  Service: Cardiovascular;  Laterality: N/A;   STONE EXTRACTION WITH BASKET Left 07/05/2021   Procedure: STONE EXTRACTION WITH BASKET;  Surgeon: Primus Bravo., MD;  [facility];  Service: Urology;  Laterality: Left;     Allergies  Allergen Reactions   Ranolazine Er Other (See Comments)    Dizziness    Sulfonamide Derivatives     Unknown      Family History  Problem Relation Age of Onset   Cancer Sister        Breast   CAD Other        Family history     Social History Ms. Kihn reports that she has never smoked. She has never used smokeless tobacco. Ms. Boudreau reports no history of alcohol use.   Review of Systems CONSTITUTIONAL: No weight loss, fever, chills, weakness or  fatigue.  HEENT: Eyes: No visual loss, blurred vision, double vision or yellow sclerae.No hearing loss, sneezing, congestion, runny nose or sore throat.  SKIN: No rash or itching.  CARDIOVASCULAR: per hpi RESPIRATORY: No shortness of breath, cough or sputum.  GASTROINTESTINAL: No anorexia, nausea, vomiting or diarrhea. No abdominal pain or blood.  GENITOURINARY: No burning on urination, no polyuria NEUROLOGICAL: No headache, dizziness, syncope, paralysis, ataxia, numbness or tingling in the extremities. No change in bowel or bladder control.  MUSCULOSKELETAL: No muscle, back pain, joint pain or stiffness.  LYMPHATICS: No enlarged nodes. No history of splenectomy.  PSYCHIATRIC: No history of depression or anxiety.  ENDOCRINOLOGIC: No reports of sweating, cold or heat intolerance. No polyuria or polydipsia.  Marland Kitchen   Physical Examination Today's Vitals   08/20/21 1151  BP: 92/60  Pulse: 70  SpO2: 97%  Weight: 166 lb 6.4 oz (75.5 kg)  Height: 5' 5.5" (1.664 m)   Body mass index is 27.27 kg/m.  Gen: resting comfortably, no acute distress HEENT: no scleral icterus, pupils equal round and reactive, no palptable cervical adenopathy,  CV: RRR, no m/r/ gno jvd Resp: Clear to auscultation bilaterally GI: abdomen is soft, non-tender, non-distended, normal bowel sounds, no hepatosplenomegaly MSK: extremities are warm, no edema.  Skin: warm, no rash Neuro:  no focal deficits Psych: appropriate affect   Diagnostic Studies She underwent coronary angiography on 08/17/2015 which demonstrated the following:   1. Single vessel CAD with patent stent mid LAD with mild restenosis. 2. Severe disease in a small caliber diagonal Hades Mathew. The ostium of this Aaron Boeh is jailed by the LAD stent. The vessel is 1.5 mm and too small for PCI.   3. Mild non-obstructive disease in the RCA and Circumflex artery.   4. Normal LV systolic function 5. Known occlusion of the LIMA graft to the LAD (non injected)    Echocardiogram on 08/17/15 showed normal left ventricular systolic function and regional wall motion, EF 59-93%, grade 1 diastolic dysfunction, and aortic valve sclerosis with trivial regurgitation.   Nuclear stress test 09/15/17 showed no significant myocardial ischemia or scar.  Blood pressure demonstrated a hypertensive response to exercise.  Duke treadmill score was low, LVEF 58%.  06/2021 echo IMPRESSIONS     1. LV function low normal to mildly reduced (EF 50).   2. Left ventricular ejection fraction, by estimation, is 45 to 50%. The  left ventricle has mildly decreased function. The left ventricle  demonstrates global hypokinesis. Left ventricular diastolic parameters are  consistent with Grade I diastolic  dysfunction (impaired relaxation). Elevated left atrial pressure.   3. Right ventricular systolic function is normal. The right ventricular  size is normal. Tricuspid regurgitation signal is inadequate for assessing  PA pressure.   4. The mitral valve is normal in structure. No evidence of mitral valve  regurgitation. No evidence of mitral stenosis.   5. The aortic valve is tricuspid. Aortic valve regurgitation is trivial.  Mild aortic valve sclerosis is present, with no evidence of aortic valve  stenosis.   6. The inferior vena cava is normal in size with greater than 50%  respiratory variability, suggesting right atrial pressure of 3 mmHg.     Assessment and Plan   1. CAD with chronic stable angina - after her PCI procedurs reports chest pain symptoms were less frequent but never fully went away - chronic stable angina about 1-2 times per month, antianginal therapy lmited by dizziness. Did not tolerate imdur 90mg , back to 6omg daily due to dizziness - if recurrent significant angina would consider lowering her other bp meds to see if could toleratin high imdur dosing.    2. HTN -bp low today, suspect due to recent weight loss that is ongoign since her admission with  sepsis - lower losartan to 50mg  daily.  - nursing visit for bp check in 3 weeks   Arnoldo Lenis, M.D.

## 2021-09-03 ENCOUNTER — Ambulatory Visit: Payer: Medicare Other | Admitting: Family Medicine

## 2021-09-11 ENCOUNTER — Other Ambulatory Visit: Payer: Self-pay

## 2021-09-11 ENCOUNTER — Encounter: Payer: Self-pay | Admitting: *Deleted

## 2021-09-11 ENCOUNTER — Ambulatory Visit (INDEPENDENT_AMBULATORY_CARE_PROVIDER_SITE_OTHER): Payer: Medicare Other | Admitting: *Deleted

## 2021-09-11 VITALS — BP 104/65 | HR 68 | Ht 65.5 in | Wt 165.6 lb

## 2021-09-11 DIAGNOSIS — I10 Essential (primary) hypertension: Secondary | ICD-10-CM

## 2021-09-11 NOTE — Progress Notes (Signed)
Presents for nurse visit to have vitals checked per last OV. Medications reviewed. Reports taking all medications as prescribed without missing any doses. Denies dizziness, chest pain or SOB. Vitals taken and sent to MD for review.

## 2021-09-11 NOTE — Patient Instructions (Signed)
Continue same plan and follow up

## 2021-09-23 NOTE — Progress Notes (Signed)
Overlooked this report, vitals were fine at that visit, no further changes  Zandra Abts MD

## 2021-10-17 ENCOUNTER — Other Ambulatory Visit: Payer: Self-pay

## 2021-10-17 ENCOUNTER — Ambulatory Visit (HOSPITAL_COMMUNITY)
Admission: RE | Admit: 2021-10-17 | Discharge: 2021-10-17 | Disposition: A | Discharge status: Home / Self Care | Payer: Medicare Other | Source: Ambulatory Visit | Attending: Urology | Admitting: Urology

## 2021-10-17 DIAGNOSIS — N201 Calculus of ureter: Secondary | ICD-10-CM | POA: Insufficient documentation

## 2021-10-24 IMAGING — US US NEEDLE LOCALIZATION*R*
1 series · 2 of 2 positions shown · non-contrast
Comparison: Previous exam(s).

CLINICAL DATA: Patient for preop for localization prior to right
lumpectomy.

EXAM:
ULTRASOUND GUIDED RADIOACTIVE SEED LOCALIZATION OF THE RIGHT BREAST

[Series 1: us needle localization*right* · 0.06mm/px · 2 of 2 slices shown]
[im 1/2]
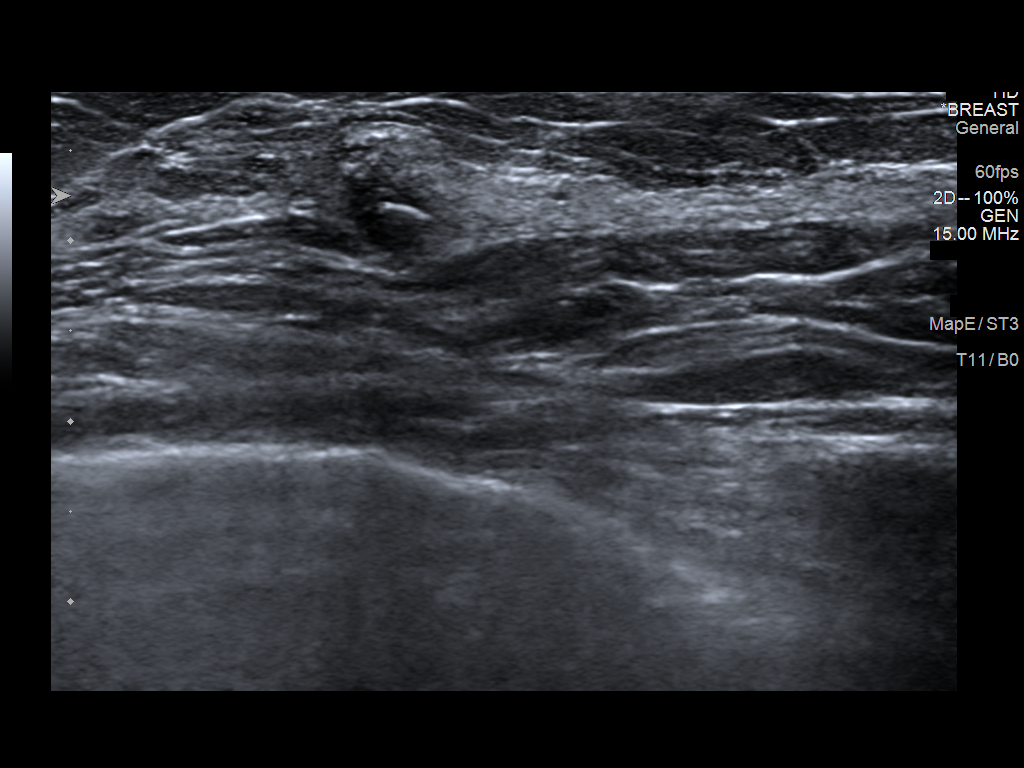
[im 2/2]
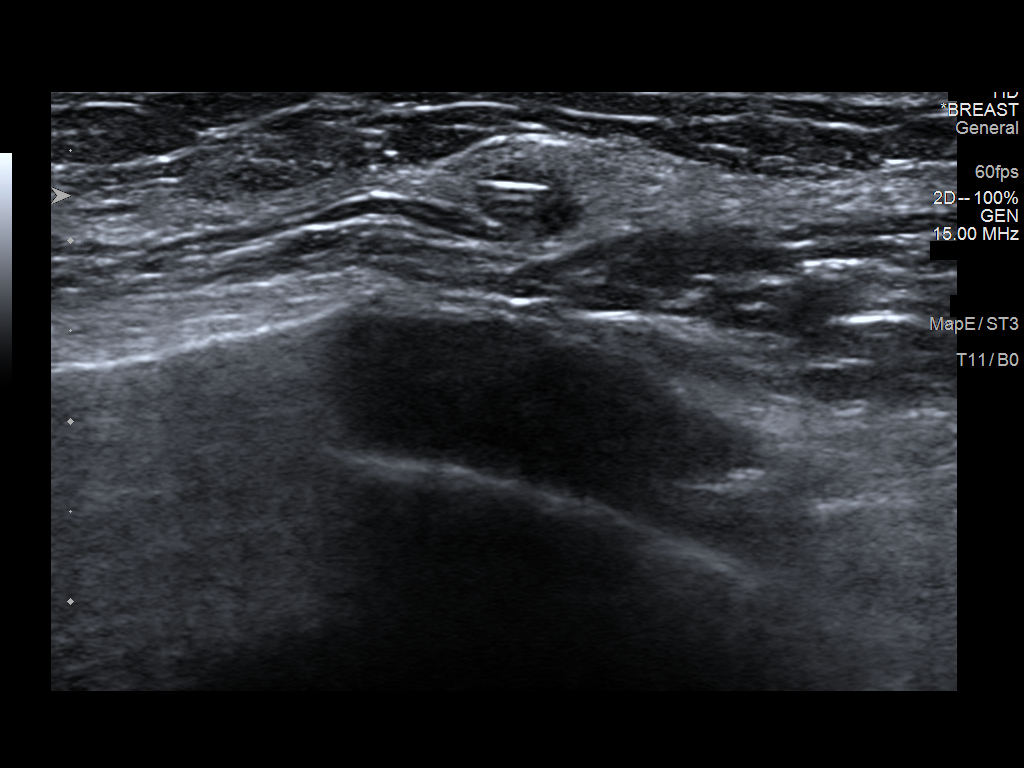

[2 of 2 positions shown; findings below may reference images not displayed]

FINDINGS: Patient presents for radioactive seed localization prior to right
lumpectomy. I met with the patient and we discussed the procedure of
seed localization including benefits and alternatives. We discussed
the high likelihood of a successful procedure. We discussed the
risks of the procedure including infection, bleeding, tissue injury
and further surgery. We discussed the low dose of radioactivity
involved in the procedure. Informed, written consent was given.

The usual time-out protocol was performed immediately prior to the
procedure.

Using ultrasound guidance, sterile technique, 1% lidocaine and an
F-SD6 radioactive seed, right breast mass 5 o'clock position was
localized using a lateral approach. The follow-up mammogram images
confirm the seed in the expected location and were marked for Dr.
Auad.

Follow-up survey of the patient confirms presence of the radioactive
seed.

Order number of F-SD6 seed:  [id number].

Total activity:  0.247 millicuries reference Date: 04/08/2021

The patient tolerated the procedure well and was released from the
[REDACTED]. She was given instructions regarding seed removal.
IMPRESSION: Radioactive seed localization right breast. No apparent
complications.

## 2021-11-07 ENCOUNTER — Ambulatory Visit: Payer: Medicare Other | Admitting: Urology

## 2021-11-07 ENCOUNTER — Ambulatory Visit (INDEPENDENT_AMBULATORY_CARE_PROVIDER_SITE_OTHER): Payer: Medicare Other | Admitting: Urology

## 2021-11-07 ENCOUNTER — Other Ambulatory Visit: Payer: Self-pay

## 2021-11-07 ENCOUNTER — Encounter: Payer: Self-pay | Admitting: Urology

## 2021-11-07 VITALS — BP 123/69 | HR 73

## 2021-11-07 DIAGNOSIS — N390 Urinary tract infection, site not specified: Secondary | ICD-10-CM

## 2021-11-07 DIAGNOSIS — N201 Calculus of ureter: Secondary | ICD-10-CM | POA: Diagnosis not present

## 2021-11-07 DIAGNOSIS — N281 Cyst of kidney, acquired: Secondary | ICD-10-CM | POA: Diagnosis not present

## 2021-11-07 DIAGNOSIS — R8290 Unspecified abnormal findings in urine: Secondary | ICD-10-CM | POA: Diagnosis not present

## 2021-11-07 LAB — MICROSCOPIC EXAMINATION
RBC, Urine: NONE SEEN /hpf (ref 0–2)
Renal Epithel, UA: NONE SEEN /hpf

## 2021-11-07 LAB — URINALYSIS, ROUTINE W REFLEX MICROSCOPIC
Bilirubin, UA: NEGATIVE
Glucose, UA: NEGATIVE
Nitrite, UA: POSITIVE — AB
Protein,UA: NEGATIVE
RBC, UA: NEGATIVE
Specific Gravity, UA: 1.025 (ref 1.005–1.030)
Urobilinogen, Ur: 0.2 mg/dL (ref 0.2–1.0)
pH, UA: 5.5 (ref 5.0–7.5)

## 2021-11-07 NOTE — Progress Notes (Signed)
? ?Assessment: ?1. Ureteral calculus, left   ?2. Renal cyst; left   ?3. Abnormal urine findings   ? ? ?Plan: ?Renal ultrasound results reviewed and discussed with the patient today. ?Urine culture sent today ?We will contact her with results and recommendations based on urine culture. ?Continue stone prevention. ?Return to office in 6 months ?Will arrange for repeat renal U/S for follow-up of left renal cyst ? ?Chief Complaint: ?Chief Complaint  ?Patient presents with  ? Nephrolithiasis  ? ? ?HPI: ?Kimberly Flowers is a 81 y.o. female who presents for continued evaluation of  left ureteral calculus.  She had acute onset of left-sided flank pain on 06/18/21.  She also had associated nausea and vomiting and subjective chills.  She was seen by her PCP and sent for CT imaging.  CT imaging from 06/18/2021 showed a 9 mm calculus in the left proximal ureter with mild left hydronephrosis.   ?She developed hypotension while in the office on 06/19/2021 and was urgently taken to the emergency room. She was diagnosed with sepsis and transferred to [facility] for management.  Urine culture grew 20 K colonies of Klebsiella.  She was treated with appropriate antibiotics.  She underwent cystoscopy with stone dislodgment and stent insertion on 06/20/2021.  She was discharged from the hospital on 06/22/2021. ?KUB from 06/19/21 showed a 4 x 9 mm calcification to the left of L3 transverse process consistent with a proximal ureteral calculus. ?  ?She is status post left ureteroscopic laser lithotripsy on 07/05/2021.  Her stone was removed at the time of the procedure.   ?Stone analysis: 90% calcium oxalate, 10% hydroxyapatite ? ?Her left ureteral stent was removed at her visit on 07/11/21.   ?She was doing well at her visit in 12/22.  No flank pain.  No urinary symptoms.  Urine culture grew >100 K Klebsiella.  She was treated with Cipro. ?Renal ultrasound from 10/17/2021 showed resolved left-sided hydronephrosis and a stable 2.7 cm  minimally complex cystic lesion left mid kidney. ?No UTI symptoms.  No dysuria or gross hematuria.  No flank pain. ? ?Portions of the above documentation were copied from a prior visit for review purposes only. ? ?Allergies: ?Allergies  ?Allergen Reactions  ? Ranolazine Er Other (See Comments)  ?  Dizziness   ? Sulfonamide Derivatives   ?  Unknown  ? ? ?PMH: ?Past Medical History:  ?Diagnosis Date  ? Anemia   ? Coronary atherosclerosis of native coronary artery   ? Previous PCI 2007-2008, Dr. Sharyon Cable  ? Essential hypertension, benign   ? History of kidney stones   ? Mixed hyperlipidemia   ? PAD (peripheral artery disease) ([place])   ?  Absent right DP  ? Palpitations   ? Type 2 diabetes mellitus ([place])   ? Vasomotor rhinitis   ? ? ?PSH: ?Past Surgical History:  ?Procedure Laterality Date  ? ABDOMINAL HYSTERECTOMY    ? APPENDECTOMY    ? BREAST LUMPECTOMY WITH RADIOACTIVE SEED LOCALIZATION Right 04/16/2021  ? Procedure: RIGHT BREAST LUMPECTOMY WITH RADIOACTIVE SEED LOCALIZATION;  Surgeon: Stark Klein, MD;  Location: [facility];  Service: General;  Laterality: Right;  ? BREAST SURGERY    ? CARDIAC CATHETERIZATION N/A 08/17/2015  ? Procedure: Left Heart Cath and Coronary Angiography;  Surgeon: Burnell Blanks, MD;  Location: [facility];  Service: Cardiovascular;  Laterality: N/A;  ? CHOLECYSTECTOMY    ? CORONARY ARTERY BYPASS GRAFT  2006  ? [name], single vessel  ? CORONARY CTO INTERVENTION N/A  08/15/2020  ? Procedure: CORONARY CTO INTERVENTION;  Surgeon: [name], [name], MD;  Location: [facility];  Service: Cardiovascular;  Laterality: N/A;  ? CORONARY STENT INTERVENTION N/A 08/15/2020  ? Procedure: CORONARY STENT INTERVENTION;  Surgeon: [name], [name], MD;  Location: [facility];  Service: Cardiovascular;  Laterality: N/A;  ? CORONARY STENT INTERVENTION N/A 10/18/2020  ? Procedure: CORONARY STENT INTERVENTION;  Surgeon: [name], [name], MD;  Location: [facility];  Service:  Cardiovascular;  Laterality: N/A;  ? CORONARY STENT INTERVENTION  10/18/2020  ? CYSTOSCOPY W/ URETERAL STENT PLACEMENT Left 06/20/2021  ? Procedure: CYSTOSCOPY WITH RETROGRADE PYELOGRAM/URETERAL STENT PLACEMENT;  Surgeon: Bjorn Loser, MD;  [facility];  Service: Urology;  Laterality: Left;  ? CYSTOSCOPY WITH RETROGRADE PYELOGRAM, URETEROSCOPY AND STENT PLACEMENT Bilateral 07/05/2021  ? Procedure: CYSTOSCOPY WITH BILATERAL RETROGRADE PYELOGRAM, LEFT URETEROSCOPY AND LEFT STENT EXCHANGE;  Surgeon: Primus Bravo., MD;  [facility];  Service: Urology;  Laterality: Bilateral;  ? HOLMIUM LASER APPLICATION Left 56/21/3086  ? Procedure: HOLMIUM LASER APPLICATION;  Surgeon: Primus Bravo., MD;  [facility];  Service: Urology;  Laterality: Left;  ? INTRAVASCULAR ULTRASOUND/IVUS N/A 08/15/2020  ? Procedure: Intravascular Ultrasound/IVUS;  Surgeon: [name], [name], MD;  Location: [facility];  Service: Cardiovascular;  Laterality: N/A;  ? INTRAVASCULAR ULTRASOUND/IVUS N/A 10/18/2020  ? Procedure: Intravascular Ultrasound/IVUS;  Surgeon: [name], [name], MD;  Location: [facility];  Service: Cardiovascular;  Laterality: N/A;  ? LEFT HEART CATH AND CORONARY ANGIOGRAPHY N/A 07/10/2020  ? Procedure: LEFT HEART CATH AND CORONARY ANGIOGRAPHY;  Surgeon: [name], MD;  Location: [facility];  Service: Cardiovascular;  Laterality: N/A;  ? STONE EXTRACTION WITH BASKET Left 07/05/2021  ? Procedure: STONE EXTRACTION WITH BASKET;  Surgeon: Primus Bravo., MD;  [facility];  Service: Urology;  Laterality: Left;  ? ? ?SH: ?Social History  ? ?Tobacco Use  ? Smoking status: Never  ? Smokeless tobacco: Never  ?Vaping Use  ? Vaping Use: Never used  ?Substance Use Topics  ? Alcohol use: No  ?  Alcohol/week: 0.0 standard drinks  ? Drug use: No  ? ? ?ROS: ?Constitutional:  Negative for fever, chills, weight loss ?CV: Negative for chest pain, previous MI,  hypertension ?Respiratory:  Negative for shortness of breath, wheezing, sleep apnea, frequent cough ?GI:  Negative for nausea, vomiting, bloody stool, GERD ? ?PE: ?BP 123/69   Pulse 73  ?GENERAL APPEARANCE:  Well appearing, well developed, well nourished, NAD ?HEENT:  Atraumatic, normocephalic, oropharynx clear ?NECK:  Supple without lymphadenopathy or thyromegaly ?ABDOMEN:  Soft, non-tender, no masses ?EXTREMITIES:  Moves all extremities well, without clubbing, cyanosis, or edema ?NEUROLOGIC:  Alert and oriented x 3, normal gait, CN II-XII grossly intact ?MENTAL STATUS:  appropriate ?BACK:  Non-tender to palpation, No CVAT ?SKIN:  Warm, dry, and intact ? ? ?Results: ?U/A: 11-30 WBC, many bacteria, nitrite + ?

## 2021-11-10 LAB — URINE CULTURE

## 2021-11-11 ENCOUNTER — Telehealth: Payer: Self-pay

## 2021-11-11 MED ORDER — CEFDINIR 300 MG PO CAPS: 300.0000 mg | ORAL_CAPSULE | Freq: Two times a day (BID) | ORAL | 0 refills | Status: AC

## 2021-11-11 NOTE — Telephone Encounter (Signed)
-----   Message from Primus Bravo, MD sent at 11/11/2021  9:15 AM EST ----- ?Please notify the patient that her urine culture did show evidence of UTI.   ?We will treat with Omnicef 300 mg twice daily x7 days.  Prescription sent. ?

## 2021-11-11 NOTE — Addendum Note (Signed)
Addended by: Primus Bravo on: 11/11/2021 09:15 AM ? ? Modules accepted: Orders ? ?

## 2021-11-11 NOTE — Telephone Encounter (Signed)
Patient called and made aware.

## 2021-12-19 ENCOUNTER — Encounter: Payer: Self-pay | Admitting: Cardiology

## 2021-12-19 ENCOUNTER — Encounter: Payer: Self-pay | Admitting: *Deleted

## 2021-12-19 ENCOUNTER — Ambulatory Visit (INDEPENDENT_AMBULATORY_CARE_PROVIDER_SITE_OTHER): Payer: Medicare Other | Admitting: Cardiology

## 2021-12-19 VITALS — BP 128/74 | HR 64 | Ht 65.5 in | Wt 161.2 lb

## 2021-12-19 DIAGNOSIS — I25118 Atherosclerotic heart disease of native coronary artery with other forms of angina pectoris: Secondary | ICD-10-CM

## 2021-12-19 DIAGNOSIS — E782 Mixed hyperlipidemia: Secondary | ICD-10-CM | POA: Diagnosis not present

## 2021-12-19 DIAGNOSIS — I10 Essential (primary) hypertension: Secondary | ICD-10-CM | POA: Diagnosis not present

## 2021-12-19 NOTE — Patient Instructions (Addendum)
Medication Instructions:  Continue all current medications.   Labwork: none  Testing/Procedures: none  Follow-Up: 6 months   Any Other Special Instructions Will Be Listed Below (If Applicable).   If you need a refill on your cardiac medications before your next appointment, please call your pharmacy.  

## 2021-12-19 NOTE — Progress Notes (Signed)
? ? ? ?Clinical Summary ?Ms. Heward is a 81 y.o.female seen today for follow up of the following medical problems.  ?  ?  ?1. CAD ?- history of prior CABG, subsequent occlusion of LIMA-LAD graft, had LAD stent ? Low risk nuclear stress test on 09/15/2017 ?  ?07/2020 cath: mid LAD 65%, D3 90%, LCX patent, RCA occluded mid RPDA fills by collaterals.  ?-she had PCI to RCA CTO with Dr [name] 08/15/20. Recs for indefintie DAPT given extensive stents.  ?- 10/2020 PCI to mid LAD ?  ?-chronic chest pains ever after her coronary interventions, though less intense.  ?  ?-ranexa listed as allergy, caused dizziness ? - last visit we increased imdur to '90mg'$  daily. Imdur '90mg'$  caused dizziness, back down to '60mg'$ .  ?- 06/2021 echo LVEF 45-50% ?- still with chest pains at times, about 1-2 times per month. Better with NG ?  ?- chronic chest pains. Pressure midchest. Would occur at rest or with exertion, moderate in severity. Better with NG. No other associated symptoms. Would last about 20 minutes.  ?- walks about once a week, 15 min.  ? ? ? ? ?  ?2. HTN ?- compliant with meds ?  ? 3. Breast cancer ? ? ?4. Hyperlipidemia ? 10/2020 TC 150 TG 113 HDL 51 LDL 79. At this time atorvastatin was increased to '80mg'$  daily, repeat labs with pcp ?Past Medical History:  ?Diagnosis Date  ? Anemia   ? Coronary atherosclerosis of native coronary artery   ? Previous PCI 2007-2008, Dr. Sharyon Cable  ? Essential hypertension, benign   ? History of kidney stones   ? Mixed hyperlipidemia   ? PAD (peripheral artery disease) ([place])   ?  Absent right DP  ? Palpitations   ? Type 2 diabetes mellitus ([place])   ? Vasomotor rhinitis   ? ? ? ?Allergies  ?Allergen Reactions  ? Ranolazine Er Other (See Comments)  ?  Dizziness   ? Sulfonamide Derivatives   ?  Unknown  ? ? ? ?Current Outpatient Medications  ?Medication Sig Dispense Refill  ? acetaminophen (TYLENOL) 500 MG tablet Take 500-1,000 mg by mouth every 6 (six) hours as needed for moderate pain.    ? amLODipine (NORVASC)  10 MG tablet TAKE 1 TABLET BY MOUTH EVERY DAY 90 tablet 1  ? Ascorbic Acid (VITAMIN C WITH ROSE HIPS) 500 MG tablet Take 1,000 mg by mouth daily.    ? aspirin 81 MG EC tablet Take 81 mg by mouth daily.    ? atorvastatin (LIPITOR) 80 MG tablet Take 1 tablet (80 mg total) by mouth daily. 90 tablet 3  ? Calcium Carb-Cholecalciferol (CALCIUM PLUS VITAMIN D3) 600-500 MG-UNIT CAPS Take 1 tablet by mouth daily.    ? clopidogrel (PLAVIX) 75 MG tablet TAKE 1 TABLET BY MOUTH EVERY DAY 90 tablet 3  ? Cyanocobalamin (VITAMIN B-12 PO) Take 1 tablet by mouth daily.    ? docusate sodium (COLACE) 100 MG capsule Take 100 mg by mouth daily as needed for mild constipation or moderate constipation.     ? eye wash (,SODIUM/POTASSIUM/SOD CHLORIDE,) SOLN Place 1 drop into both eyes 3 (three) times a week.    ? ferrous gluconate (FERGON) 324 MG tablet Take 324 mg by mouth daily with breakfast.    ? fexofenadine (ALLEGRA) 180 MG tablet Take 180 mg by mouth daily.    ? Glucosamine-Chondroitin-MSM-D3 TABS Take 1 tablet by mouth daily.    ? isosorbide mononitrate (IMDUR) 60 MG 24 hr tablet TAKE  1.5 TABLETS (90 MG TOTAL) BY MOUTH DAILY. (Patient taking differently: Take 60 mg by mouth daily.) 135 tablet 3  ? letrozole (FEMARA) 2.5 MG tablet Take 1 tablet by mouth daily.    ? losartan (COZAAR) 50 MG tablet Take 1 tablet (50 mg total) by mouth daily.    ? metFORMIN (GLUCOPHAGE) 500 MG tablet Take 500 mg by mouth daily with breakfast.    ? Multiple Vitamin (MULTIVITAMIN WITH MINERALS) TABS tablet Take 1 tablet by mouth daily.    ? Multiple Vitamins-Minerals (PRESERVISION AREDS 2 PO) Take 1 tablet by mouth in the morning and at bedtime. EYE VITAMIN    ? nebivolol (BYSTOLIC) 10 MG tablet Take 1 tablet (10 mg total) by mouth daily. 30 tablet 6  ? nitroGLYCERIN (NITROLINGUAL) 0.4 MG/SPRAY spray Place 1 spray under the tongue every 5 (five) minutes x 3 doses as needed for chest pain (if no relief after 3rd dose, proceed to the ED for an evaluation).  4.9 g 3  ? Omega-3 Fatty Acids (FISH OIL PO) Take 1 capsule by mouth daily.    ? pantoprazole (PROTONIX) 40 MG tablet TAKE 1 TABLET BY MOUTH EVERY DAY 90 tablet 3  ? potassium chloride SA (KLOR-CON) 20 MEQ tablet Take 1 tablet (20 mEq total) by mouth 2 (two) times daily. 14 tablet 0  ? ?No current facility-administered medications for this visit.  ? ? ? ?Past Surgical History:  ?Procedure Laterality Date  ? ABDOMINAL HYSTERECTOMY    ? APPENDECTOMY    ? BREAST LUMPECTOMY WITH RADIOACTIVE SEED LOCALIZATION Right 04/16/2021  ? Procedure: RIGHT BREAST LUMPECTOMY WITH RADIOACTIVE SEED LOCALIZATION;  Surgeon: Stark Klein, MD;  Location: [place];  Service: General;  Laterality: Right;  ? BREAST SURGERY    ? CARDIAC CATHETERIZATION N/A 08/17/2015  ? Procedure: Left Heart Cath and Coronary Angiography;  Surgeon: Burnell Blanks, MD;  Location: [place] [facility];  Service: Cardiovascular;  Laterality: N/A;  ? CHOLECYSTECTOMY    ? CORONARY ARTERY BYPASS GRAFT  2006  ? [name], single vessel  ? CORONARY CTO INTERVENTION N/A 08/15/2020  ? Procedure: CORONARY CTO INTERVENTION;  Surgeon: [name], [name], MD;  Location: [place] [facility];  Service: Cardiovascular;  Laterality: N/A;  ? CORONARY STENT INTERVENTION N/A 08/15/2020  ? Procedure: CORONARY STENT INTERVENTION;  Surgeon: [name], [name], MD;  Location: [place] [facility];  Service: Cardiovascular;  Laterality: N/A;  ? CORONARY STENT INTERVENTION N/A 10/18/2020  ? Procedure: CORONARY STENT INTERVENTION;  Surgeon: [name], [name], MD;  Location: [place] [facility];  Service: Cardiovascular;  Laterality: N/A;  ? CORONARY STENT INTERVENTION  10/18/2020  ? CYSTOSCOPY W/ URETERAL STENT PLACEMENT Left 06/20/2021  ? Procedure: CYSTOSCOPY WITH RETROGRADE PYELOGRAM/URETERAL STENT PLACEMENT;  Surgeon: Bjorn Loser, MD;  [facility];  Service: Urology;  Laterality: Left;  ? CYSTOSCOPY WITH RETROGRADE PYELOGRAM, URETEROSCOPY AND STENT PLACEMENT Bilateral 07/05/2021  ?  Procedure: CYSTOSCOPY WITH BILATERAL RETROGRADE PYELOGRAM, LEFT URETEROSCOPY AND LEFT STENT EXCHANGE;  Surgeon: Primus Bravo., MD;  [facility];  Service: Urology;  Laterality: Bilateral;  ? HOLMIUM LASER APPLICATION Left 22/97/9892  ? Procedure: HOLMIUM LASER APPLICATION;  Surgeon: Primus Bravo., MD;  [facility];  Service: Urology;  Laterality: Left;  ? INTRAVASCULAR ULTRASOUND/IVUS N/A 08/15/2020  ? Procedure: Intravascular Ultrasound/IVUS;  Surgeon: [name], [name], MD;  Location: [place] [facility];  Service: Cardiovascular;  Laterality: N/A;  ? INTRAVASCULAR ULTRASOUND/IVUS N/A 10/18/2020  ? Procedure: Intravascular Ultrasound/IVUS;  Surgeon: [name], Peter  M, MD;  Location: [place] [facility];  Service: Cardiovascular;  Laterality: N/A;  ? LEFT HEART CATH AND CORONARY ANGIOGRAPHY N/A 07/10/2020  ? Procedure: LEFT HEART CATH AND CORONARY ANGIOGRAPHY;  Surgeon: [name], MD;  Location: [place] [facility];  Service: Cardiovascular;  Laterality: N/A;  ? STONE EXTRACTION WITH BASKET Left 07/05/2021  ? Procedure: STONE EXTRACTION WITH BASKET;  Surgeon: Primus Bravo., MD;  [facility];  Service: Urology;  Laterality: Left;  ? ? ? ?Allergies  ?Allergen Reactions  ? Ranolazine Er Other (See Comments)  ?  Dizziness   ? Sulfonamide Derivatives   ?  Unknown  ? ? ? ? ?Family History  ?Problem Relation Age of Onset  ? Cancer Sister   ?     Breast  ? CAD Other   ?     Family history  ? ? ? ?Social History ?Ms. Scholer reports that she has never smoked. She has never used smokeless tobacco. ?Ms. Schubring reports no history of alcohol use. ? ? ?Review of Systems ?CONSTITUTIONAL: No weight loss, fever, chills, weakness or fatigue.  ?HEENT: Eyes: No visual loss, blurred vision, double vision or yellow sclerae.No hearing loss, sneezing, congestion, runny nose or sore throat.  ?SKIN: No rash or itching.  ?CARDIOVASCULAR: per hpi ?RESPIRATORY: No shortness of breath, cough or sputum.   ?GASTROINTESTINAL: No anorexia, nausea, vomiting or diarrhea. No abdominal pain or blood.  ?GENITOURINARY: No burning on urination, no polyuria ?NEUROLOGICAL: No headache, dizziness, syncope, paralysis, atax

## 2021-12-25 ENCOUNTER — Encounter (HOSPITAL_COMMUNITY): Payer: Self-pay

## 2021-12-26 ENCOUNTER — Ambulatory Visit (INDEPENDENT_AMBULATORY_CARE_PROVIDER_SITE_OTHER): Payer: Medicare Other | Admitting: Urology

## 2021-12-26 ENCOUNTER — Encounter: Payer: Self-pay | Admitting: Urology

## 2021-12-26 VITALS — BP 138/70 | HR 66

## 2021-12-26 DIAGNOSIS — Z87440 Personal history of urinary (tract) infections: Secondary | ICD-10-CM

## 2021-12-26 DIAGNOSIS — N281 Cyst of kidney, acquired: Secondary | ICD-10-CM

## 2021-12-26 DIAGNOSIS — R109 Unspecified abdominal pain: Secondary | ICD-10-CM | POA: Diagnosis not present

## 2021-12-26 LAB — URINALYSIS, ROUTINE W REFLEX MICROSCOPIC
Bilirubin, UA: NEGATIVE
Glucose, UA: NEGATIVE
Leukocytes,UA: NEGATIVE
Nitrite, UA: NEGATIVE
Protein,UA: NEGATIVE
RBC, UA: NEGATIVE
Specific Gravity, UA: >1.03 — ABNORMAL HIGH (ref 1.005–1.030)
Urobilinogen, Ur: 1 mg/dL (ref 0.2–1.0)
pH, UA: 5.5 (ref 5.0–7.5)

## 2021-12-26 NOTE — Progress Notes (Signed)
? ?Assessment: ?1. Renal cyst; left   ?2. History of UTI   ?3. Flank pain   ? ? ?Plan: ?I reviewed the patient's prior CT scan from 10/22 which did not show any other renal or ureteral calculi.  Her urinalysis is unremarkable today. ?Her symptoms may be musculoskeletal in nature. ?Return to office in September 2023 to make arrangements for repeat ultrasound for follow-up of the left renal cyst. ? ?Chief Complaint: ?Chief Complaint  ?Patient presents with  ? Flank Pain  ? ? ?HPI: ?Kimberly Flowers is a 81 y.o. female who presents for evaluation of bilateral flank and back pain.  She reports a several day history of bilateral flank and low back pain.  She reports the pain as intermittent and mild.  She is not having any pain at the present time.  She has noted some mild discomfort with voiding.  No fevers, chills, nausea, or vomiting.  No gross hematuria. ? ?She was previously seen for a left ureteral calculus.  She had acute onset of left-sided flank pain on 06/18/21. She also had associated nausea and vomiting and subjective chills.  She was seen by her PCP and sent for CT imaging.  CT imaging from 06/18/2021 showed a 9 mm calculus in the left proximal ureter with mild left hydronephrosis.   ?She developed hypotension while in the office on 06/19/2021 and was urgently taken to the emergency room. She was diagnosed with sepsis and transferred to [facility] for management.  Urine culture grew 20 K colonies of Klebsiella.  She was treated with appropriate antibiotics.  She underwent cystoscopy with stone dislodgment and stent insertion on 06/20/2021.  She was discharged from the hospital on 06/22/2021. ?KUB from 06/19/21 showed a 4 x 9 mm calcification to the left of L3 transverse process consistent with a proximal ureteral calculus. ? She underwent left ureteroscopic laser lithotripsy on 07/05/2021.  Her stone was removed at the time of the procedure.   ?Stone analysis: 90% calcium oxalate, 10% hydroxyapatite ?Her  left ureteral stent was removed at her visit on 07/11/21.   ?She was doing well at her visit in 12/22.  No flank pain.  No urinary symptoms.  Urine culture grew >100 K Klebsiella.  She was treated with Cipro. ?Renal ultrasound from 10/17/2021 showed resolved left-sided hydronephrosis and a stable 2.7 cm minimally complex cystic lesion left mid kidney. ?At her visit in March 2023, she was not having any UTI symptoms.  No dysuria or gross hematuria.  No flank pain. ?Urine culture from 3/23 grew >100 K Klebsiella.  She was treated with Omnicef x7 days. ? ? ?Portions of the above documentation were copied from a prior visit for review purposes only. ? ?Allergies: ?Allergies  ?Allergen Reactions  ? Ranolazine Er Other (See Comments)  ?  Dizziness   ? Sulfonamide Derivatives   ?  Unknown  ? ? ?PMH: ?Past Medical History:  ?Diagnosis Date  ? Anemia   ? Coronary atherosclerosis of native coronary artery   ? Previous PCI 2007-2008, Dr. Sharyon Cable  ? Essential hypertension, benign   ? History of kidney stones   ? Mixed hyperlipidemia   ? PAD (peripheral artery disease) ([place])   ?  Absent right DP  ? Palpitations   ? Type 2 diabetes mellitus ([place])   ? Vasomotor rhinitis   ? ? ?PSH: ?Past Surgical History:  ?Procedure Laterality Date  ? ABDOMINAL HYSTERECTOMY    ? APPENDECTOMY    ? BREAST LUMPECTOMY WITH RADIOACTIVE SEED LOCALIZATION  Right 04/16/2021  ? Procedure: RIGHT BREAST LUMPECTOMY WITH RADIOACTIVE SEED LOCALIZATION;  Surgeon: Stark Klein, MD;  Location: [place];  Service: General;  Laterality: Right;  ? BREAST SURGERY    ? CARDIAC CATHETERIZATION N/A 08/17/2015  ? Procedure: Left Heart Cath and Coronary Angiography;  Surgeon: Burnell Blanks, MD;  Location: [place] [facility];  Service: Cardiovascular;  Laterality: N/A;  ? CHOLECYSTECTOMY    ? CORONARY ARTERY BYPASS GRAFT  2006  ? [name], single vessel  ? CORONARY CTO INTERVENTION N/A 08/15/2020  ? Procedure: CORONARY CTO INTERVENTION;  Surgeon: [name], [name], MD;   Location: [place] [facility];  Service: Cardiovascular;  Laterality: N/A;  ? CORONARY STENT INTERVENTION N/A 08/15/2020  ? Procedure: CORONARY STENT INTERVENTION;  Surgeon: [name], [name], MD;  Location: [place] [facility];  Service: Cardiovascular;  Laterality: N/A;  ? CORONARY STENT INTERVENTION N/A 10/18/2020  ? Procedure: CORONARY STENT INTERVENTION;  Surgeon: [name], [name], MD;  Location: [place] [facility];  Service: Cardiovascular;  Laterality: N/A;  ? CORONARY STENT INTERVENTION  10/18/2020  ? CYSTOSCOPY W/ URETERAL STENT PLACEMENT Left 06/20/2021  ? Procedure: CYSTOSCOPY WITH RETROGRADE PYELOGRAM/URETERAL STENT PLACEMENT;  Surgeon: Bjorn Loser, MD;  [facility];  Service: Urology;  Laterality: Left;  ? CYSTOSCOPY WITH RETROGRADE PYELOGRAM, URETEROSCOPY AND STENT PLACEMENT Bilateral 07/05/2021  ? Procedure: CYSTOSCOPY WITH BILATERAL RETROGRADE PYELOGRAM, LEFT URETEROSCOPY AND LEFT STENT EXCHANGE;  Surgeon: Primus Bravo., MD;  [facility];  Service: Urology;  Laterality: Bilateral;  ? HOLMIUM LASER APPLICATION Left 11/94/1740  ? Procedure: HOLMIUM LASER APPLICATION;  Surgeon: Primus Bravo., MD;  [facility];  Service: Urology;  Laterality: Left;  ? INTRAVASCULAR ULTRASOUND/IVUS N/A 08/15/2020  ? Procedure: Intravascular Ultrasound/IVUS;  Surgeon: [name], [name], MD;  Location: [place] [facility];  Service: Cardiovascular;  Laterality: N/A;  ? INTRAVASCULAR ULTRASOUND/IVUS N/A 10/18/2020  ? Procedure: Intravascular Ultrasound/IVUS;  Surgeon: [name], [name], MD;  Location: [place] [facility];  Service: Cardiovascular;  Laterality: N/A;  ? LEFT HEART CATH AND CORONARY ANGIOGRAPHY N/A 07/10/2020  ? Procedure: LEFT HEART CATH AND CORONARY ANGIOGRAPHY;  Surgeon: [name], MD;  Location: [place] [facility];  Service: Cardiovascular;  Laterality: N/A;  ? STONE EXTRACTION WITH BASKET Left 07/05/2021  ? Procedure: STONE EXTRACTION WITH BASKET;  Surgeon: Primus Bravo., MD;  [facility];  Service: Urology;  Laterality: Left;  ? ? ?SH: ?Social History  ? ?Tobacco Use  ? Smoking status: Never  ? Smokeless tobacco: Never  ?Vaping Use  ? Vaping Use: Never used  ?Substance Use Topics  ? Alcohol use: No  ?  Alcohol/week: 0.0 standard drinks  ? Drug use: No  ? ? ?ROS: ?Constitutional:  Negative for fever, chills, weight loss ?CV: Negative for chest pain, previous MI, hypertension ?Respiratory:  Negative for shortness of breath, wheezing, sleep apnea, frequent cough ?GI:  Negative for nausea, vomiting, bloody stool, GERD ? ?PE: ?BP 138/70   Pulse 66  ?GENERAL APPEARANCE:  Well appearing, well developed, well nourished, NAD ?HEENT:  Atraumatic, normocephalic, oropharynx clear ?NECK:  Supple without lymphadenopathy or thyromegaly ?ABDOMEN:  Soft, non-tender, no masses ?EXTREMITIES:  Moves all extremities well, without clubbing, cyanosis, or edema ?NEUROLOGIC:  Alert and oriented x 3, normal gait, CN II-XII grossly intact ?MENTAL STATUS:  appropriate ?BACK:  Non-tender to palpation, No CVAT ?SKIN:  Warm, dry, and intact ? ? ?Results: ?U/A: dipstick 1+ protein ?

## 2021-12-27 IMAGING — CR DG ABDOMEN 1V
2 series · 2 of 2 positions shown · non-contrast
Comparison: Concurrently performed CT examination

CLINICAL DATA: Left nephrolithiasis

EXAM:
ABDOMEN - 1 VIEW

[t ap supine (1 of 2)]
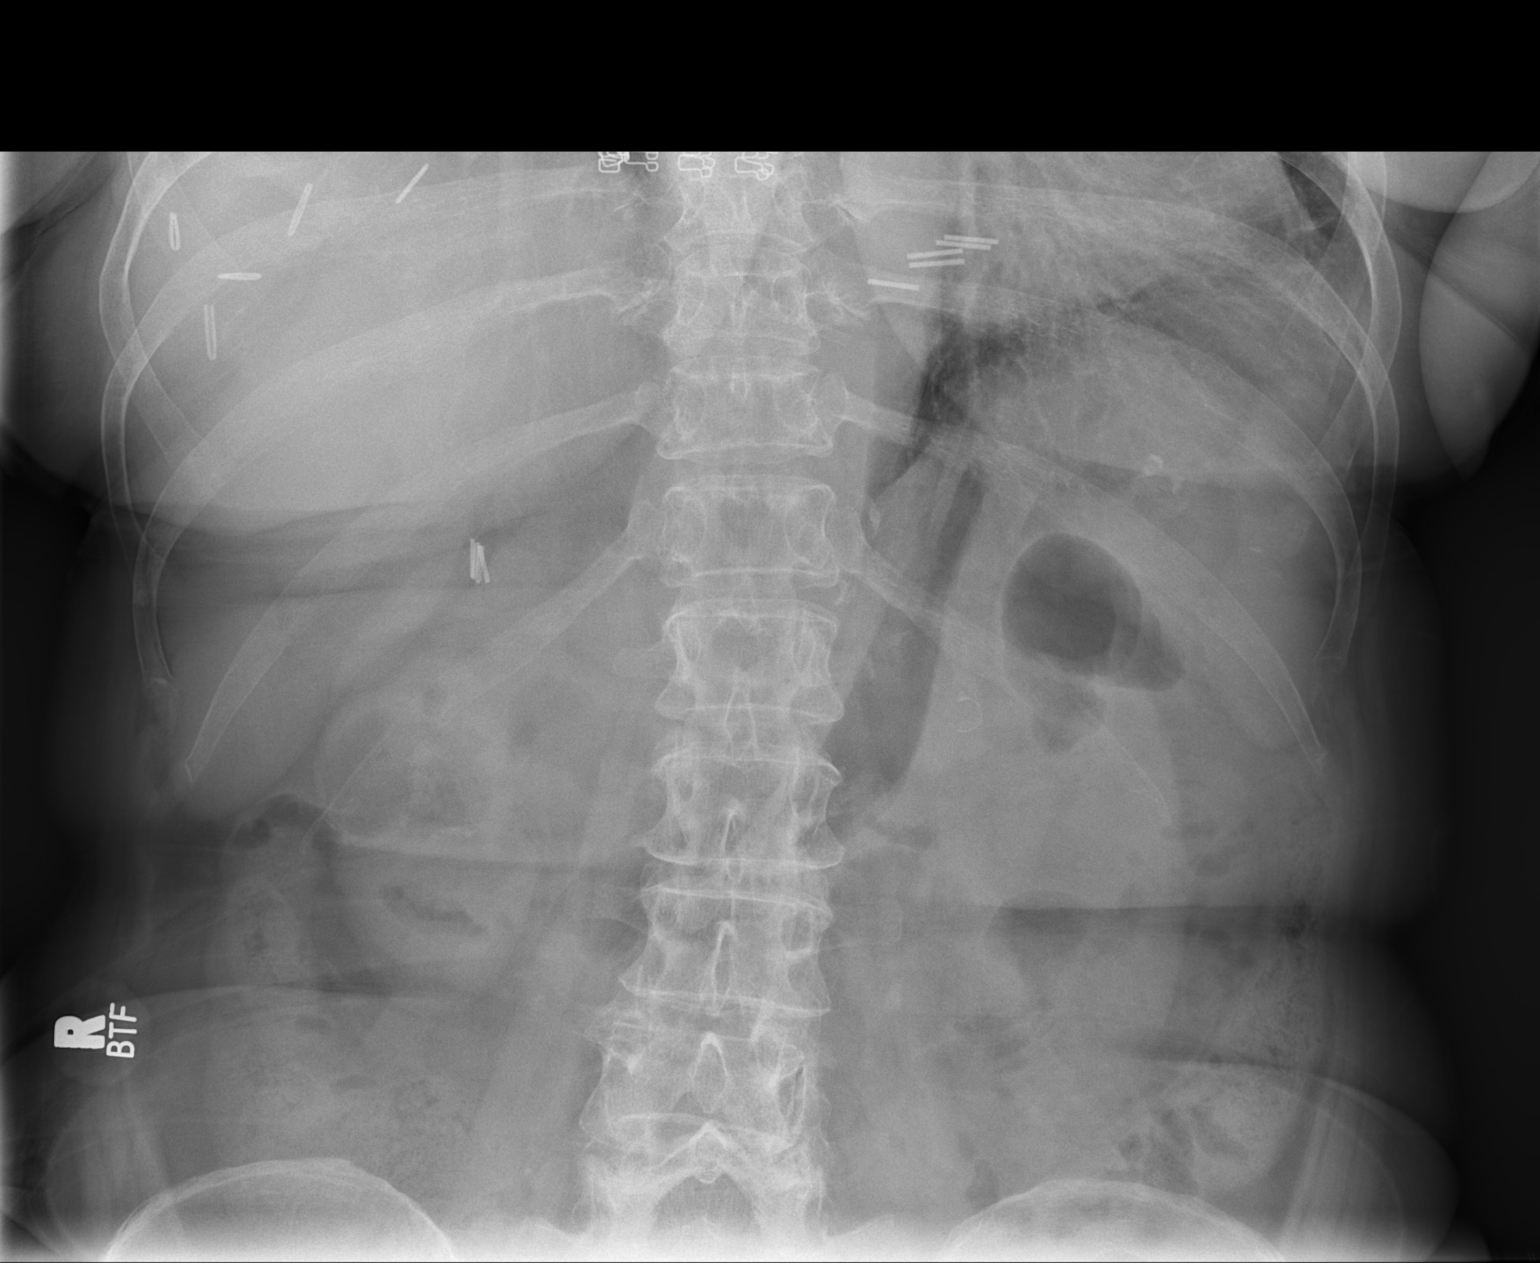

[t ap supine (2 of 2)]
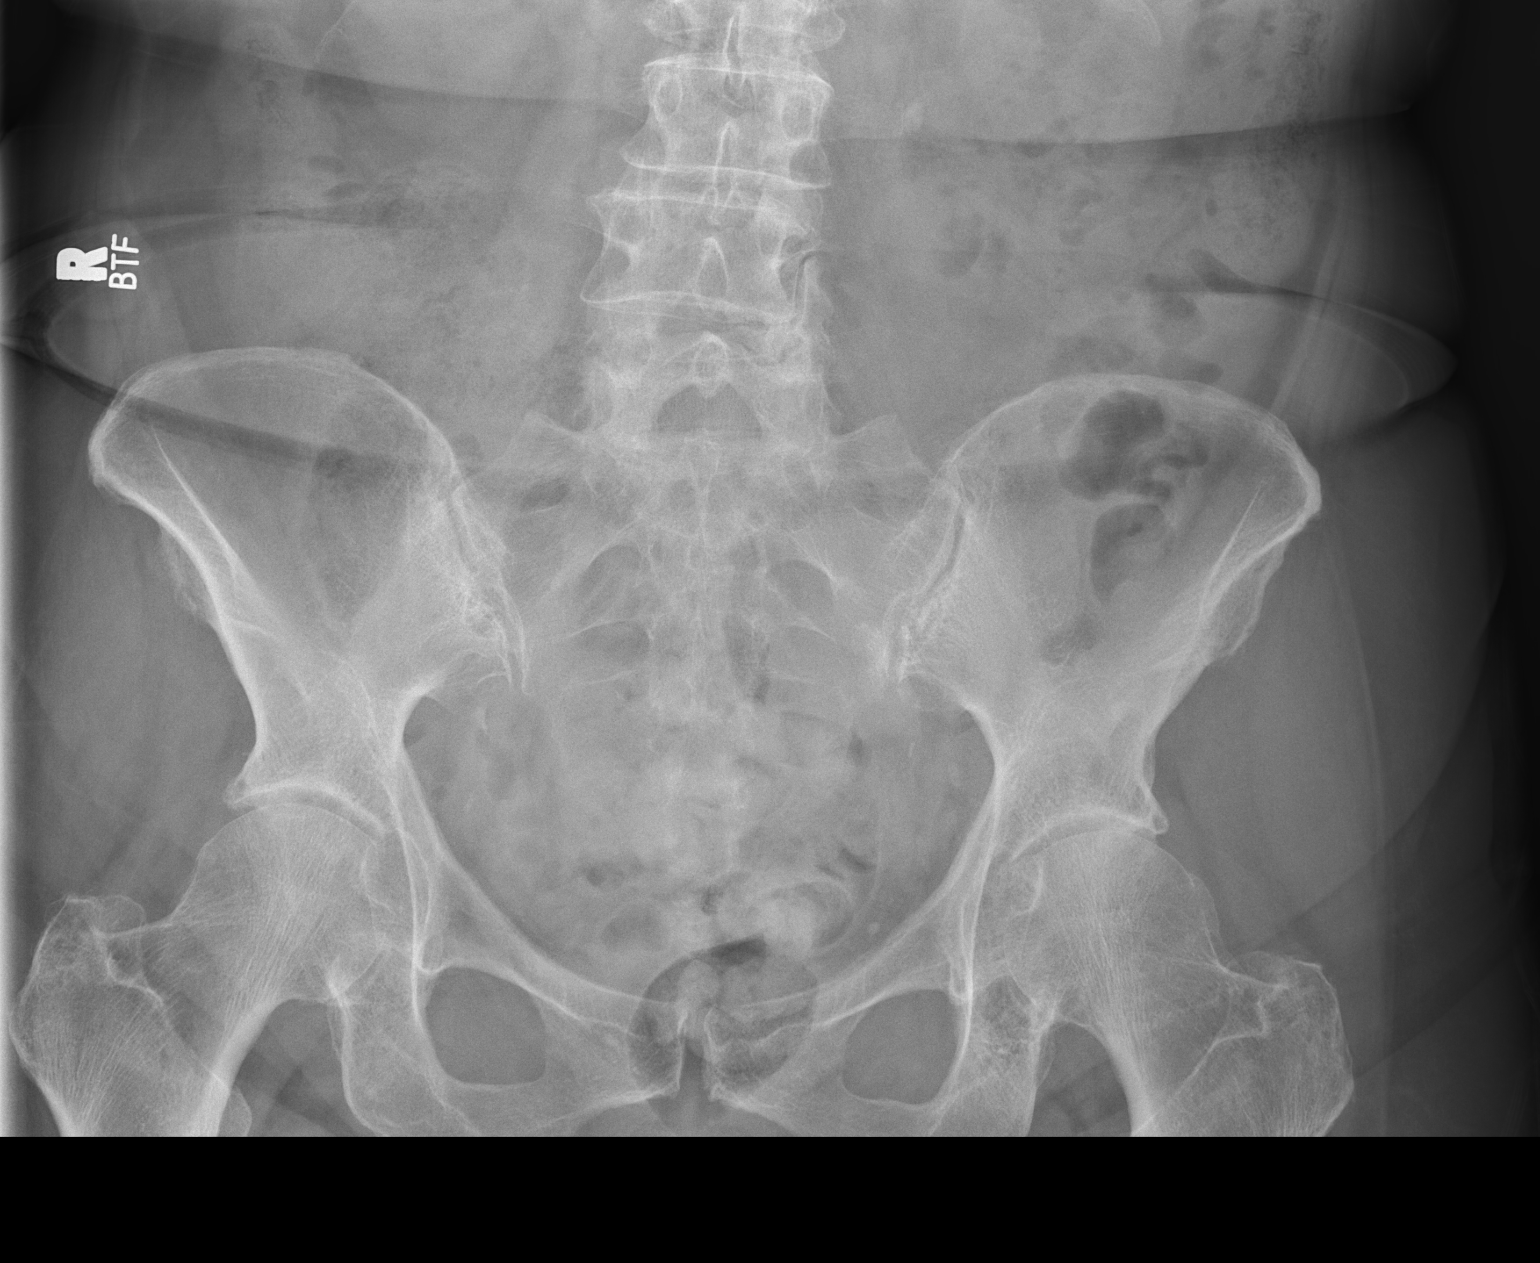

[2 of 2 positions shown; findings below may reference images not displayed]

FINDINGS: 9 mm calculus is seen within the expected left proximal ureter at
the level of the inferior endplate of L3. Vascular calcifications
are seen within the left upper quadrant. Phleboliths noted within
the left hemipelvis. Normal abdominal gas pattern. No organomegaly.
Surgical clips noted within the epigastrium and overlying the right
lung base. Cholecystectomy clips seen within the right upper
quadrant.
IMPRESSION: 9 mm calculus within the proximal left ureter at the level of the
inferior endplate of L3.

## 2021-12-27 IMAGING — DX DG CHEST 1V PORT
1 series · 1 of 1 positions shown · non-contrast
Comparison: No priors.

CLINICAL DATA: 80-year-old female with history of shortness of
breath.

EXAM:
PORTABLE CHEST 1 VIEW

[chest ap]
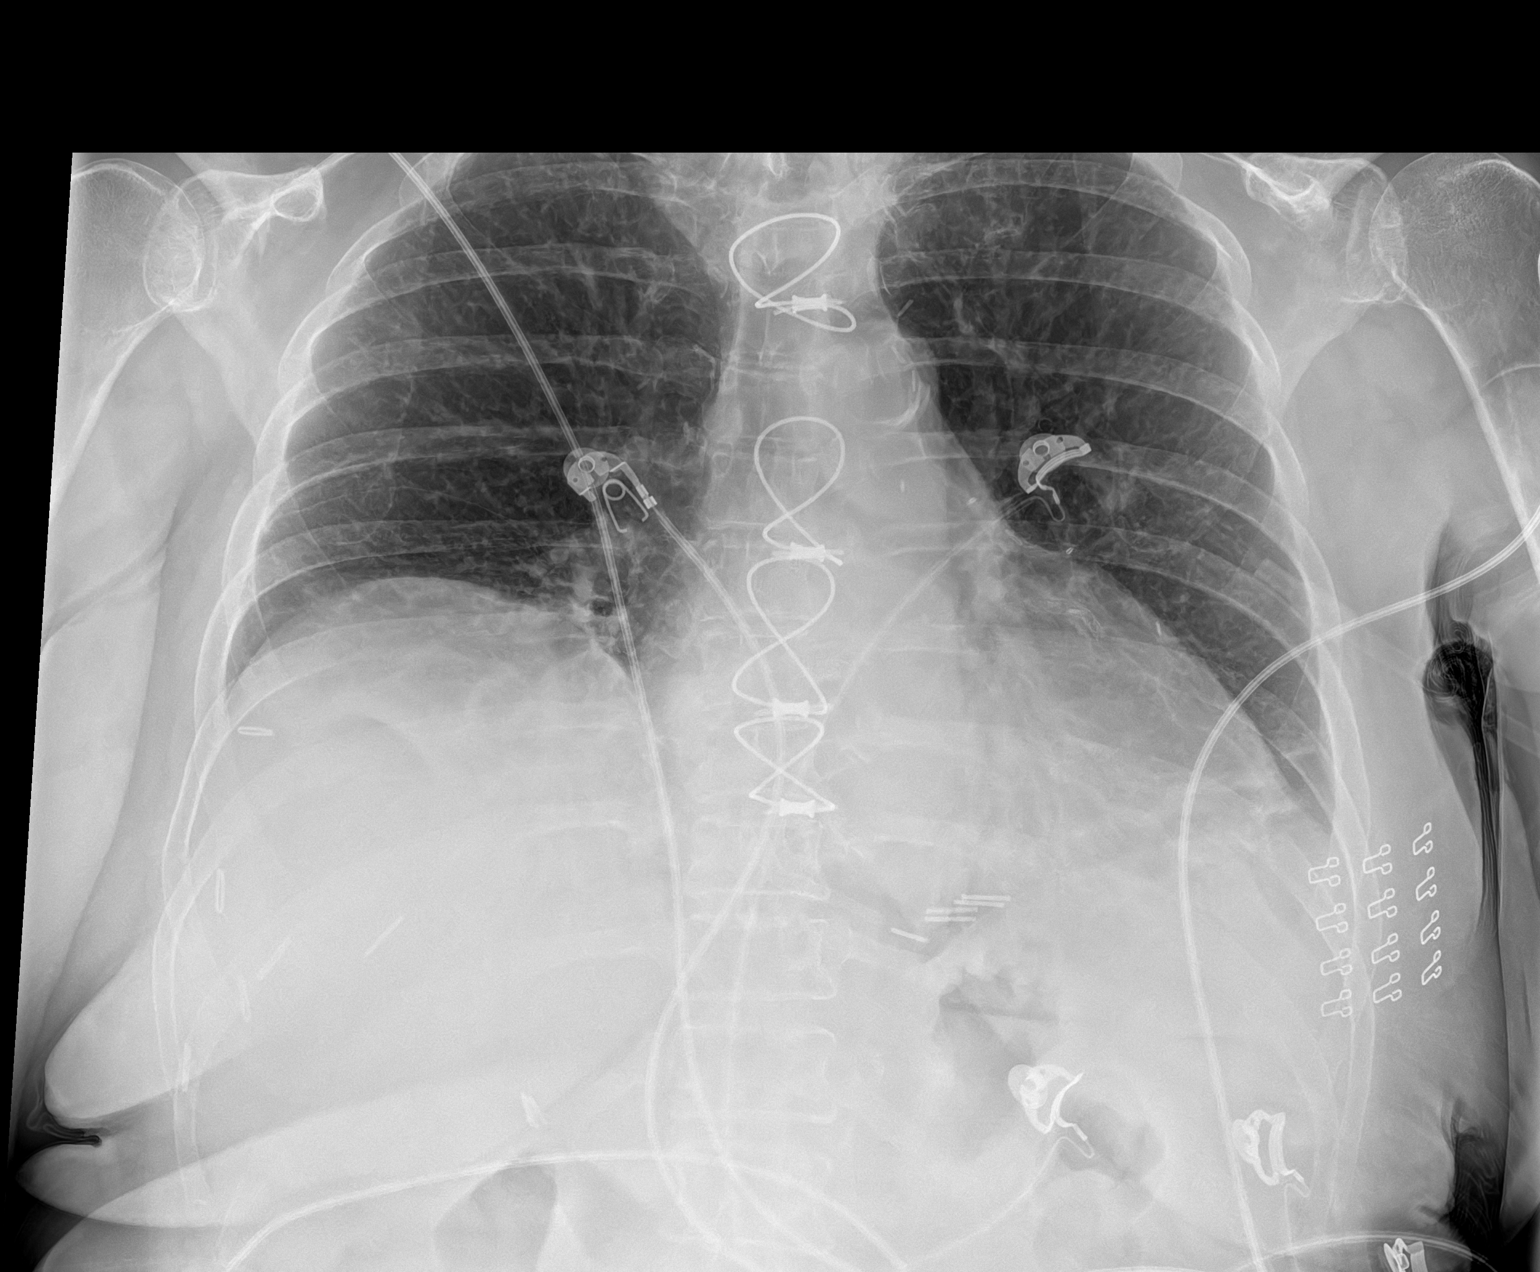

[1 of 1 positions shown; findings below may reference images not displayed]

FINDINGS: Lung volumes are low. Elevation of the right hemidiaphragm. Opacity
in the left lung base partially obscuring the left hemidiaphragm. No
pleural effusions. No pneumothorax. No pulmonary nodule or mass
noted. Pulmonary vasculature is normal. Heart size appears
borderline enlarged. Upper mediastinal contours are within normal
limits. Atherosclerotic calcifications in the thoracic aorta. Status
post median sternotomy. Surgical clips near the gastroesophageal
junction. Multiple surgical clips also project over the right upper
quadrant of the abdomen.
IMPRESSION: 1. Atelectasis and/or consolidation in the left lower lobe.
2. Low lung volumes with elevation of the right hemidiaphragm.
3. Aortic atherosclerosis.

## 2022-01-11 ENCOUNTER — Other Ambulatory Visit: Payer: Self-pay | Admitting: Cardiology

## 2022-05-09 ENCOUNTER — Ambulatory Visit (INDEPENDENT_AMBULATORY_CARE_PROVIDER_SITE_OTHER): Payer: Medicare Other | Admitting: Urology

## 2022-05-09 ENCOUNTER — Encounter: Payer: Self-pay | Admitting: Urology

## 2022-05-09 VITALS — BP 105/61 | HR 55 | Ht 65.0 in | Wt 155.0 lb

## 2022-05-09 DIAGNOSIS — Z87440 Personal history of urinary (tract) infections: Secondary | ICD-10-CM | POA: Diagnosis not present

## 2022-05-09 DIAGNOSIS — N281 Cyst of kidney, acquired: Secondary | ICD-10-CM | POA: Diagnosis not present

## 2022-05-09 LAB — URINALYSIS, ROUTINE W REFLEX MICROSCOPIC
Bilirubin, UA: NEGATIVE
Glucose, UA: NEGATIVE
Nitrite, UA: NEGATIVE
Protein,UA: NEGATIVE
RBC, UA: NEGATIVE
Specific Gravity, UA: 1.015 (ref 1.005–1.030)
Urobilinogen, Ur: 1 mg/dL (ref 0.2–1.0)
pH, UA: 5 (ref 5.0–7.5)

## 2022-05-09 LAB — MICROSCOPIC EXAMINATION
RBC, Urine: NONE SEEN /hpf (ref 0–2)
Renal Epithel, UA: NONE SEEN /hpf

## 2022-05-09 NOTE — Progress Notes (Signed)
Assessment: 1. Renal cyst; left   2. History of UTI     Plan: Renal U/S ordered for follow-up of left renal cyst We will call with results of ultrasound and to arrange follow-up. Continue stone prevention  Chief Complaint: Chief Complaint  Patient presents with   renal cyst    HPI: Kimberly Flowers is a 81 y.o. female who presents for continued evaluation a left renal cyst and history of nephrolithiasis. She was previously seen for a left ureteral calculus.  She had acute onset of left-sided flank pain on 06/18/21. She also had associated nausea and vomiting and subjective chills.  She was seen by her PCP and sent for CT imaging.  CT imaging from 06/18/2021 showed a 9 mm calculus in the left proximal ureter with mild left hydronephrosis.   She developed hypotension while in the office on 06/19/2021 and was urgently taken to the emergency room. She was diagnosed with sepsis and transferred to [facility] for management.  Urine culture grew 20 K colonies of Klebsiella.  She was treated with appropriate antibiotics.  She underwent cystoscopy with stone dislodgment and stent insertion on 06/20/2021.  She was discharged from the hospital on 06/22/2021. KUB from 06/19/21 showed a 4 x 9 mm calcification to the left of L3 transverse process consistent with a proximal ureteral calculus. She underwent left ureteroscopic laser lithotripsy on 07/05/2021.  Her stone was removed at the time of the procedure.   Stone analysis: 90% calcium oxalate, 10% hydroxyapatite Her left ureteral stent was removed at her visit on 07/11/21.   She was doing well at her visit in 12/22.  No flank pain.  No urinary symptoms.  Urine culture grew >100 K Klebsiella.  She was treated with Cipro. Renal ultrasound from 10/17/2021 showed resolved left-sided hydronephrosis and a stable 2.7 cm minimally complex cystic lesion left mid kidney. At her visit in March 2023, she was not having any UTI symptoms.  No dysuria or gross  hematuria.  No flank pain. Urine culture from 3/23 grew >100 K Klebsiella.  She was treated with Omnicef x7 days.  She was seen in April 2023 for evaluation of bilateral flank and back pain.  She reported a several day history of bilateral flank and low back pain, intermittent and mild. She noted some mild discomfort with voiding.  No fevers, chills, nausea, or vomiting.  No gross hematuria.  She returns today for scheduled follow-up.  She has not had any flank pain or other stone symptoms.  No dysuria or gross hematuria.  Portions of the above documentation were copied from a prior visit for review purposes only.  Allergies: Allergies  Allergen Reactions   Ranolazine Er Other (See Comments)    Dizziness    Sulfonamide Derivatives     Unknown    PMH: Past Medical History:  Diagnosis Date   Anemia    Coronary atherosclerosis of native coronary artery    Previous PCI 2007-2008, Dr. Sharyon Cable   Essential hypertension, benign    History of kidney stones    Mixed hyperlipidemia    PAD (peripheral artery disease) ([place])     Absent right DP   Palpitations    Type 2 diabetes mellitus ([facility])    Vasomotor rhinitis     PSH: Past Surgical History:  Procedure Laterality Date   ABDOMINAL HYSTERECTOMY     APPENDECTOMY     BREAST LUMPECTOMY WITH RADIOACTIVE SEED LOCALIZATION Right 04/16/2021   Procedure: RIGHT BREAST LUMPECTOMY WITH RADIOACTIVE SEED LOCALIZATION;  Surgeon: Stark Klein, MD;  Location: [place];  Service: General;  Laterality: Right;   BREAST SURGERY     CARDIAC CATHETERIZATION N/A 08/17/2015   Procedure: Left Heart Cath and Coronary Angiography;  Surgeon: Burnell Blanks, MD;  Location: [place] [facility];  Service: Cardiovascular;  Laterality: N/A;   CHOLECYSTECTOMY     CORONARY ARTERY BYPASS GRAFT  2006   [name], single vessel   CORONARY CTO INTERVENTION N/A 08/15/2020   Procedure: CORONARY CTO INTERVENTION;  Surgeon: [name], [name], MD;  Location: [place] [facility];  Service: Cardiovascular;  Laterality: N/A;   CORONARY STENT INTERVENTION N/A 08/15/2020   Procedure: CORONARY STENT INTERVENTION;  Surgeon: [name], [name], MD;  Location: [place] [facility];  Service: Cardiovascular;  Laterality: N/A;   CORONARY STENT INTERVENTION N/A 10/18/2020   Procedure: CORONARY STENT INTERVENTION;  Surgeon: [name], [name], MD;  Location: [place] [facility];  Service: Cardiovascular;  Laterality: N/A;   CORONARY STENT INTERVENTION  10/18/2020   CYSTOSCOPY W/ URETERAL STENT PLACEMENT Left 06/20/2021   Procedure: CYSTOSCOPY WITH RETROGRADE PYELOGRAM/URETERAL STENT PLACEMENT;  Surgeon: Bjorn Loser, MD;  [facility];  Service: Urology;  Laterality: Left;   CYSTOSCOPY WITH RETROGRADE PYELOGRAM, URETEROSCOPY AND STENT PLACEMENT Bilateral 07/05/2021   Procedure: CYSTOSCOPY WITH BILATERAL RETROGRADE PYELOGRAM, LEFT URETEROSCOPY AND LEFT STENT EXCHANGE;  Surgeon: Primus Bravo., MD;  [facility];  Service: Urology;  Laterality: Bilateral;   HOLMIUM LASER APPLICATION Left 30/16/0109   Procedure: HOLMIUM LASER APPLICATION;  Surgeon: Primus Bravo., MD;  [facility];  Service: Urology;  Laterality: Left;   INTRAVASCULAR ULTRASOUND/IVUS N/A 08/15/2020   Procedure: Intravascular Ultrasound/IVUS;  Surgeon: [name], [name], MD;  Location: [place] [facility];  Service: Cardiovascular;  Laterality: N/A;   INTRAVASCULAR ULTRASOUND/IVUS N/A 10/18/2020   Procedure: Intravascular Ultrasound/IVUS;  Surgeon: [name], [name], MD;  Location: [place] [facility];  Service: Cardiovascular;  Laterality: N/A;   LEFT HEART CATH AND CORONARY ANGIOGRAPHY N/A 07/10/2020   Procedure: LEFT HEART CATH AND CORONARY ANGIOGRAPHY;  Surgeon: [name], MD;  Location: [place] [facility];  Service: Cardiovascular;  Laterality: N/A;   STONE EXTRACTION WITH BASKET Left 07/05/2021   Procedure: STONE EXTRACTION WITH BASKET;  Surgeon: Primus Bravo., MD;  [facility];  Service: Urology;  Laterality: Left;    SH: Social History   Tobacco Use   Smoking status: Never   Smokeless tobacco: Never  Vaping Use   Vaping Use: Never used  Substance Use Topics   Alcohol use: No    Alcohol/week: 0.0 standard drinks of alcohol   Drug use: No    ROS: Constitutional:  Negative for fever, chills, weight loss CV: Negative for chest pain, previous MI, hypertension Respiratory:  Negative for shortness of breath, wheezing, sleep apnea, frequent cough GI:  Negative for nausea, vomiting, bloody stool, GERD  PE: BP 105/61   Pulse (!) 55   Ht '5\' 5"'$  (1.651 m)   Wt 155 lb (70.3 kg)   BMI 25.79 kg/m  GENERAL APPEARANCE:  Well appearing, well developed, well nourished, NAD HEENT:  Atraumatic, normocephalic, oropharynx clear NECK:  Supple without lymphadenopathy or thyromegaly ABDOMEN:  Soft, non-tender, no masses EXTREMITIES:  Moves all extremities well, without clubbing, cyanosis, or edema NEUROLOGIC:  Alert and oriented x 3, normal gait, CN II-XII grossly intact MENTAL STATUS:  appropriate BACK:  Non-tender to palpation, No CVAT SKIN:  Warm, dry, and intact   Results: U/A: 11-30  WBC, mod bacteria, nitrite negative

## 2022-06-05 ENCOUNTER — Ambulatory Visit (HOSPITAL_COMMUNITY)
Admission: RE | Admit: 2022-06-05 | Discharge: 2022-06-05 | Disposition: A | Discharge status: Home / Self Care | Payer: Medicare Other | Source: Ambulatory Visit | Attending: Urology | Admitting: Urology

## 2022-06-05 ENCOUNTER — Other Ambulatory Visit: Payer: Self-pay | Admitting: Cardiology

## 2022-06-05 DIAGNOSIS — N281 Cyst of kidney, acquired: Secondary | ICD-10-CM | POA: Insufficient documentation

## 2022-06-09 ENCOUNTER — Telehealth: Payer: Self-pay

## 2022-06-09 DIAGNOSIS — N281 Cyst of kidney, acquired: Secondary | ICD-10-CM

## 2022-06-09 NOTE — Telephone Encounter (Signed)
Patient informed of MD response to [place].  Patient placed on a recall for a 1 year f/u with Dr. Felipa Eth.  Renal US placed and patient aware to have imaging done prior to f/u.  Instructions also added to recall.

## 2022-06-09 NOTE — Telephone Encounter (Signed)
-----   Message from Primus Bravo, MD sent at 06/09/2022  8:51 AM EDT ----- Please notify the patient that the renal ultrasound shows a stable cyst in the left kidney.  Recommend follow-up in 1 year with a repeat ultrasound.

## 2022-06-13 ENCOUNTER — Encounter: Payer: Self-pay | Admitting: *Deleted

## 2022-06-13 ENCOUNTER — Encounter: Payer: Self-pay | Admitting: Cardiology

## 2022-06-13 ENCOUNTER — Ambulatory Visit: Payer: Medicare Other | Attending: Cardiology | Admitting: Cardiology

## 2022-06-13 VITALS — BP 120/68 | HR 67 | Ht 65.0 in | Wt 159.4 lb

## 2022-06-13 DIAGNOSIS — E782 Mixed hyperlipidemia: Secondary | ICD-10-CM | POA: Diagnosis present

## 2022-06-13 DIAGNOSIS — I10 Essential (primary) hypertension: Secondary | ICD-10-CM | POA: Insufficient documentation

## 2022-06-13 DIAGNOSIS — I25118 Atherosclerotic heart disease of native coronary artery with other forms of angina pectoris: Secondary | ICD-10-CM | POA: Diagnosis present

## 2022-06-13 NOTE — Patient Instructions (Signed)
Medication Instructions:  Continue all current medications.   Labwork: none  Testing/Procedures: none  Follow-Up: 6 months   Any Other Special Instructions Will Be Listed Below (If Applicable).   If you need a refill on your cardiac medications before your next appointment, please call your pharmacy.  

## 2022-06-13 NOTE — Progress Notes (Signed)
Clinical Summary Ms. Morace is a 81 y.o.female seen today for follow up of the following medical problems.      1. CAD - history of prior CABG, subsequent occlusion of LIMA-LAD graft, had LAD stent  Low risk nuclear stress test on 09/15/2017   07/2020 cath: mid LAD 65%, D3 90%, LCX patent, RCA occluded mid RPDA fills by collaterals.  -she had PCI to RCA CTO with Dr [name] 08/15/20. Recs for indefintie DAPT given extensive stents.  - 10/2020 PCI to mid LAD   -chronic chest pains even after her coronary interventions, though less intense.    -ranexa listed as allergy, caused dizziness  - last visit we increased imdur to '90mg'$  daily. Imdur '90mg'$  caused dizziness, back down to '60mg'$ .  - 06/2021 echo LVEF 45-50% - still with chest pains at times, about 1-2 times per month. Better with NG   - chronic chest pains. Pressure midchest. Would occur at rest or with exertion, moderate in severity. Better with NG. No other associated symptoms. Would last about 20 minutes.  - walks about once a week, 15 min.      - some recent chest pains. On average 2-3 times per week. Can occur at rest or with activity. About 6/10 in severity, typically improves with NG.  - stable frequency, stable severity      2. HTN - compliant with meds    3. Breast cancer     4. Hyperlipidemia  10/2020 TC 150 TG 113 HDL 51 LDL 79. At this time atorvastatin was increased to '80mg'$  daily, repeat labs with pcp - reports recent labs with pcp Past Medical History:  Diagnosis Date   Anemia    Coronary atherosclerosis of native coronary artery    Previous PCI 2007-2008, Dr. Sharyon Cable   Essential hypertension, benign    History of kidney stones    Mixed hyperlipidemia    PAD (peripheral artery disease) ([place])     Absent right DP   Palpitations    Type 2 diabetes mellitus (HCC)    Vasomotor rhinitis      Allergies  Allergen Reactions   Ranolazine Er Other (See Comments)    Dizziness    Sulfonamide Derivatives      Unknown     Current Outpatient Medications  Medication Sig Dispense Refill   acetaminophen (TYLENOL) 500 MG tablet Take 500-1,000 mg by mouth every 6 (six) hours as needed for moderate pain.     amLODipine (NORVASC) 10 MG tablet TAKE 1 TABLET BY MOUTH EVERY DAY 90 tablet 3   anastrozole (ARIMIDEX) 1 MG tablet Take 1 mg by mouth daily.     Ascorbic Acid (VITAMIN C WITH ROSE HIPS) 500 MG tablet Take 1,000 mg by mouth daily.     aspirin 81 MG EC tablet Take 81 mg by mouth daily.     atorvastatin (LIPITOR) 80 MG tablet TAKE 1 TABLET BY MOUTH EVERY DAY 90 tablet 3   Calcium Carb-Cholecalciferol (CALCIUM PLUS VITAMIN D3) 600-500 MG-UNIT CAPS Take 1 tablet by mouth daily.     cholecalciferol (VITAMIN D3) 25 MCG (1000 UNIT) tablet Take 1,000 Units by mouth daily.     clopidogrel (PLAVIX) 75 MG tablet TAKE 1 TABLET BY MOUTH EVERY DAY 90 tablet 3   Cyanocobalamin (VITAMIN B-12 PO) Take 1 tablet by mouth daily.     docusate sodium (COLACE) 100 MG capsule Take 100 mg by mouth daily as needed for mild constipation or moderate constipation.  eye wash (,SODIUM/POTASSIUM/SOD CHLORIDE,) SOLN Place 1 drop into both eyes 3 (three) times a week.     ferrous gluconate (FERGON) 324 MG tablet Take 324 mg by mouth daily with breakfast.     fexofenadine (ALLEGRA) 180 MG tablet Take 180 mg by mouth daily.     Glucosamine-Chondroitin-MSM-D3 TABS Take 1 tablet by mouth daily.     isosorbide mononitrate (IMDUR) 60 MG 24 hr tablet TAKE 1.5 TABLETS (90 MG TOTAL) BY MOUTH DAILY. 135 tablet 3   losartan (COZAAR) 50 MG tablet Take 1 tablet (50 mg total) by mouth daily.     metFORMIN (GLUCOPHAGE) 500 MG tablet Take 500 mg by mouth daily with breakfast.     Multiple Vitamin (MULTIVITAMIN WITH MINERALS) TABS tablet Take 1 tablet by mouth daily.     Multiple Vitamins-Minerals (PRESERVISION AREDS 2 PO) Take 1 tablet by mouth in the morning and at bedtime. EYE VITAMIN     nebivolol (BYSTOLIC) 10 MG tablet Take 1 tablet  (10 mg total) by mouth daily. 30 tablet 6   nitroGLYCERIN (NITROLINGUAL) 0.4 MG/SPRAY spray Place 1 spray under the tongue every 5 (five) minutes x 3 doses as needed for chest pain (if no relief after 3rd dose, proceed to the ED for an evaluation). 4.9 g 3   Omega-3 Fatty Acids (FISH OIL PO) Take 1 capsule by mouth daily.     pantoprazole (PROTONIX) 40 MG tablet TAKE 1 TABLET BY MOUTH EVERY DAY 90 tablet 3   No current facility-administered medications for this visit.     Past Surgical History:  Procedure Laterality Date   ABDOMINAL HYSTERECTOMY     APPENDECTOMY     BREAST LUMPECTOMY WITH RADIOACTIVE SEED LOCALIZATION Right 04/16/2021   Procedure: RIGHT BREAST LUMPECTOMY WITH RADIOACTIVE SEED LOCALIZATION;  Surgeon: Stark Klein, MD;  Location: [facility];  Service: General;  Laterality: Right;   BREAST SURGERY     CARDIAC CATHETERIZATION N/A 08/17/2015   Procedure: Left Heart Cath and Coronary Angiography;  Surgeon: Burnell Blanks, MD;  Location: [facility];  Service: Cardiovascular;  Laterality: N/A;   CHOLECYSTECTOMY     CORONARY ARTERY BYPASS GRAFT  2006   [name], single vessel   CORONARY CTO INTERVENTION N/A 08/15/2020   Procedure: CORONARY CTO INTERVENTION;  Surgeon: [name], [name], MD;  Location: [facility];  Service: Cardiovascular;  Laterality: N/A;   CORONARY STENT INTERVENTION N/A 08/15/2020   Procedure: CORONARY STENT INTERVENTION;  Surgeon: [name], [name], MD;  Location: [facility];  Service: Cardiovascular;  Laterality: N/A;   CORONARY STENT INTERVENTION N/A 10/18/2020   Procedure: CORONARY STENT INTERVENTION;  Surgeon: [name], [name], MD;  Location: [facility];  Service: Cardiovascular;  Laterality: N/A;   CORONARY STENT INTERVENTION  10/18/2020   CYSTOSCOPY W/ URETERAL STENT PLACEMENT Left 06/20/2021   Procedure: CYSTOSCOPY WITH RETROGRADE PYELOGRAM/URETERAL STENT PLACEMENT;  Surgeon: Bjorn Loser, MD;  Location: [facility];   Service: Urology;  Laterality: Left;   CYSTOSCOPY WITH RETROGRADE PYELOGRAM, URETEROSCOPY AND STENT PLACEMENT Bilateral 07/05/2021   Procedure: CYSTOSCOPY WITH BILATERAL RETROGRADE PYELOGRAM, LEFT URETEROSCOPY AND LEFT STENT EXCHANGE;  Surgeon: Primus Bravo., MD;  Location: [facility];  Service: Urology;  Laterality: Bilateral;   HOLMIUM LASER APPLICATION Left 81/44/8185   Procedure: HOLMIUM LASER APPLICATION;  Surgeon: Primus Bravo., MD;  Location: [facility];  Service: Urology;  Laterality: Left;   INTRAVASCULAR ULTRASOUND/IVUS N/A 08/15/2020   Procedure: Intravascular Ultrasound/IVUS;  Surgeon: [name], [name], MD;  Location: [facility];  Service: Cardiovascular;  Laterality: N/A;   INTRAVASCULAR ULTRASOUND/IVUS N/A 10/18/2020   Procedure: Intravascular Ultrasound/IVUS;  Surgeon: [name], [name], MD;  Location: [facility];  Service: Cardiovascular;  Laterality: N/A;   LEFT HEART CATH AND CORONARY ANGIOGRAPHY N/A 07/10/2020   Procedure: LEFT HEART CATH AND CORONARY ANGIOGRAPHY;  Surgeon: [name], MD;  Location: [facility];  Service: Cardiovascular;  Laterality: N/A;   STONE EXTRACTION WITH BASKET Left 07/05/2021   Procedure: STONE EXTRACTION WITH BASKET;  Surgeon: Primus Bravo., MD;  Location: [facility];  Service: Urology;  Laterality: Left;     Allergies  Allergen Reactions   Ranolazine Er Other (See Comments)    Dizziness    Sulfonamide Derivatives     Unknown      Family History  Problem Relation Age of Onset   Cancer Sister        Breast   CAD Other        Family history     Social History Ms. Kitagawa reports that she has never smoked. She has never used smokeless tobacco. Ms. Sanko reports no history of alcohol use.   Review of Systems CONSTITUTIONAL: No weight loss, fever, chills, weakness or fatigue.  HEENT: Eyes: No visual loss, blurred vision, double vision or yellow sclerae.No hearing loss, sneezing, congestion, runny  nose or sore throat.  SKIN: No rash or itching.  CARDIOVASCULAR: per hpi RESPIRATORY: No shortness of breath, cough or sputum.  GASTROINTESTINAL: No anorexia, nausea, vomiting or diarrhea. No abdominal pain or blood.  GENITOURINARY: No burning on urination, no polyuria NEUROLOGICAL: No headache, dizziness, syncope, paralysis, ataxia, numbness or tingling in the extremities. No change in bowel or bladder control.  MUSCULOSKELETAL: No muscle, back pain, joint pain or stiffness.  LYMPHATICS: No enlarged nodes. No history of splenectomy.  PSYCHIATRIC: No history of depression or anxiety.  ENDOCRINOLOGIC: No reports of sweating, cold or heat intolerance. No polyuria or polydipsia.  Marland Kitchen   Physical Examination Today's Vitals   06/13/22 0922  BP: 120/68  Pulse: 67  SpO2: 98%  Weight: 159 lb 6.4 oz (72.3 kg)  Height: '5\' 5"'$  (1.651 m)   Body mass index is 26.53 kg/m.  Gen: resting comfortably, no acute distress HEENT: no scleral icterus, pupils equal round and reactive, no palptable cervical adenopathy,  CV: RRR, no m/r/g no jvd Resp: Clear to auscultation bilaterally GI: abdomen is soft, non-tender, non-distended, normal bowel sounds, no hepatosplenomegaly MSK: extremities are warm, no edema.  Skin: warm, no rash Neuro:  no focal deficits Psych: appropriate affect   Diagnostic Studies  She underwent coronary angiography on 08/17/2015 which demonstrated the following:   1. Single vessel CAD with patent stent mid LAD with mild restenosis. 2. Severe disease in a small caliber diagonal Marce Schartz. The ostium of this Arnetha Silverthorne is jailed by the LAD stent. The vessel is 1.5 mm and too small for PCI.   3. Mild non-obstructive disease in the RCA and Circumflex artery.   4. Normal LV systolic function 5. Known occlusion of the LIMA graft to the LAD (non injected)   Echocardiogram on 08/17/15 showed normal left ventricular systolic function and regional wall motion, EF 29-52%, grade 1 diastolic  dysfunction, and aortic valve sclerosis with trivial regurgitation.   Nuclear stress test 09/15/17 showed no significant myocardial ischemia or scar.  Blood pressure demonstrated a hypertensive response to exercise.  Duke treadmill score was low, LVEF 58%.   06/2021 echo IMPRESSIONS  1. LV function low normal to mildly reduced (EF 50).   2. Left ventricular ejection fraction, by estimation, is 45 to 50%. The  left ventricle has mildly decreased function. The left ventricle  demonstrates global hypokinesis. Left ventricular diastolic parameters are  consistent with Grade I diastolic  dysfunction (impaired relaxation). Elevated left atrial pressure.   3. Right ventricular systolic function is normal. The right ventricular  size is normal. Tricuspid regurgitation signal is inadequate for assessing  PA pressure.   4. The mitral valve is normal in structure. No evidence of mitral valve  regurgitation. No evidence of mitral stenosis.   5. The aortic valve is tricuspid. Aortic valve regurgitation is trivial.  Mild aortic valve sclerosis is present, with no evidence of aortic valve  stenosis.   6. The inferior vena cava is normal in size with greater than 50%  respiratory variability, suggesting right atrial pressure of 3 mmHg.    Assessment and Plan  1. CAD with chronic stable angina - after her PCI procedurs reports chest pain symptoms were less frequent but never fully went away - chronic stable angina overall stable.  Did not tolerate imdur '90mg'$ , back to 6omg daily due to dizziness - may try lowering losartan in the future to try again to retitrate imdur, continue current meds for now   2. HTN -at goal, continue curren tmeds   3. Hyperlipidemia - continue current meds, request pcp labs          Arnoldo Lenis, M.D.

## 2022-07-01 ENCOUNTER — Telehealth: Payer: Self-pay | Admitting: Cardiology

## 2022-07-01 NOTE — Telephone Encounter (Signed)
Patient states that when she saw Dr. Harl Bowie on 10/6, they discussed lowering her losartan in the future depending on the intensity and frequency of her chest pains. States that she has been under a lot of stress with her husband being sick and that the chest pains have gotten a little worse within the last 1.5 weeks.  Wants to know what Dr. Harl Bowie would recommend she lower the losartan to? Please advise

## 2022-07-01 NOTE — Telephone Encounter (Signed)
Pt is needing to confirm what dose Dr. Harl Bowie said to take for the Isosorbide and the Losartan  Please give pt a call @ [phone]

## 2022-07-02 MED ORDER — LOSARTAN POTASSIUM 50 MG PO TABS: 25.0000 mg | ORAL_TABLET | Freq: Every day | ORAL | Status: DC

## 2022-07-02 NOTE — Telephone Encounter (Signed)
Lower losartan to '25mg'$  daily. I believe she was taking imdur '60mg'$  daily though '90mg'$  is on her list, please clarify. Would increase imdur by '30mg'$  above what she is currently taking to see if helps chest pain  J Venancio Chenier MD

## 2022-07-02 NOTE — Telephone Encounter (Signed)
Patient made aware. States that she is currently taking 60 mg of Imdur but will increase to 90 mg. States that she will see how this works and notify Dr. Harl Bowie in a few weeks.

## 2022-07-03 NOTE — Telephone Encounter (Signed)
Yes, states that she is going to take half of the 50 mg tablets.

## 2022-08-30 ENCOUNTER — Other Ambulatory Visit: Payer: Self-pay | Admitting: Cardiology

## 2022-09-29 ENCOUNTER — Telehealth: Payer: Self-pay

## 2022-09-29 NOTE — Telephone Encounter (Signed)
Patient return call to office. Made patient aware that the MD will put in a referral to Dr. Landis Gandy for bladder drop. Gave patient [facility] phone number. Patient voiced understanding

## 2022-09-29 NOTE — Telephone Encounter (Signed)
Patient call in today voicing that she thinks her bladder is dropping. Patient states she contact her PCP but they recommend she see a urologist. Made patient aware that we do not treat bladder drops in our office. Made patient aware that I can ask one of the MD here and see if a referral can be put in to a GYN and some one will contact her back once the MD reviews. Patient voiced understanding

## 2022-09-29 NOTE — Telephone Encounter (Signed)
Tried calling patient with no answer. Left vm for return call 

## 2022-11-05 ENCOUNTER — Other Ambulatory Visit: Payer: Self-pay | Admitting: Cardiology

## 2022-11-11 ENCOUNTER — Other Ambulatory Visit: Payer: Self-pay | Admitting: Cardiology

## 2022-12-30 ENCOUNTER — Encounter: Payer: Self-pay | Admitting: Cardiology

## 2022-12-30 ENCOUNTER — Ambulatory Visit: Payer: Medicare Other | Attending: Cardiology | Admitting: Cardiology

## 2022-12-30 VITALS — BP 108/60 | HR 64 | Ht 65.0 in | Wt 165.0 lb

## 2022-12-30 DIAGNOSIS — I25118 Atherosclerotic heart disease of native coronary artery with other forms of angina pectoris: Secondary | ICD-10-CM | POA: Diagnosis present

## 2022-12-30 DIAGNOSIS — I10 Essential (primary) hypertension: Secondary | ICD-10-CM | POA: Diagnosis present

## 2022-12-30 DIAGNOSIS — E782 Mixed hyperlipidemia: Secondary | ICD-10-CM | POA: Insufficient documentation

## 2022-12-30 MED ORDER — ISOSORBIDE MONONITRATE ER 60 MG PO TB24: ORAL_TABLET | ORAL | 6 refills | Status: DC

## 2022-12-30 NOTE — Patient Instructions (Signed)
Medication Instructions:  Stop Losartan (Cozaar) Increase your Imdur to  every morning &  every evening Continue all other medications.     Labwork: none  Testing/Procedures: none  Follow-Up: 2 months   Any Other Special Instructions Will Be Listed Below (If Applicable). Please call the office with update on your chest pain in 1 week.   If you need a refill on your cardiac medications before your next appointment, please call your pharmacy.

## 2022-12-30 NOTE — Progress Notes (Signed)
Clinical Summary Kimberly Flowers is a 82 y.o.female seen today for follow up of the following medical problems.      1. CAD - history of prior CABG, subsequent occlusion of LIMA-LAD graft, had LAD stent  Low risk nuclear stress test on 09/15/2017   07/2020 cath: mid LAD 65%, D3 90%, LCX patent, RCA occluded mid RPDA fills by collaterals.  -she had PCI to RCA CTO with Dr [name] 08/15/20. Recs for indefintie DAPT given extensive stents.  - 10/2020 PCI to mid LAD   -chronic chest pains even after her coronary interventions, though less intense.    -ranexa listed as allergy, caused dizziness - 06/2021 echo LVEF 45-50%      - some recent chest pains. On average 2-3 times per week. Can occur at rest or with activity. About 6/10 in severity, typically improves with NG.  - stable frequency, stable severity     - last visit increased imdur to  daily. Due to prior diziness on this dose we lowered her losartan to  daily as well - increased imdur  but appears did not lower her losartan dose - chronic chest pains unchanged    2. HTN - she is compliant with meds    3. Breast cancer     4. Hyperlipidemia  10/2020 TC 150 TG 113 HDL 51 LDL 79. At this time atorvastatin was increased to  daily, repeat labs with pcp 02/2022 TC 139 TG 95 HDL 49 LDL 72 -has repeat labs in June of this year with pcp Past Medical History:  Diagnosis Date   Anemia    Coronary atherosclerosis of native coronary artery    Previous PCI 2007-2008, Dr. Georganna Skeans   Essential hypertension, benign    History of kidney stones    Mixed hyperlipidemia    PAD (peripheral artery disease) (HCC)     Absent right DP   Palpitations    Type 2 diabetes mellitus (HCC)    Vasomotor rhinitis      Allergies  Allergen Reactions   Ranolazine Er Other (See Comments)    Dizziness    Sulfonamide Derivatives     Unknown     Current Outpatient Medications  Medication Sig Dispense Refill   acetaminophen  (TYLENOL) 500 MG tablet Take 500-1,000 mg by mouth every 6 (six) hours as needed for moderate pain.     amLODipine (NORVASC) 10 MG tablet TAKE 1 TABLET BY MOUTH EVERY DAY 90 tablet 1   anastrozole (ARIMIDEX) 1 MG tablet Take 1 mg by mouth daily.     Ascorbic Acid (VITAMIN C WITH ROSE HIPS) 500 MG tablet Take 1,000 mg by mouth daily.     aspirin 81 MG EC tablet Take 81 mg by mouth daily.     atorvastatin (LIPITOR) 80 MG tablet TAKE 1 TABLET BY MOUTH EVERY DAY 90 tablet 1   Calcium Carb-Cholecalciferol (CALCIUM PLUS VITAMIN D3) 600-500 MG-UNIT CAPS Take 1 tablet by mouth daily.     cholecalciferol (VITAMIN D3) 25 MCG (1000 UNIT) tablet Take 1,000 Units by mouth daily.     clopidogrel (PLAVIX) 75 MG tablet TAKE 1 TABLET BY MOUTH EVERY DAY 90 tablet 3   Cyanocobalamin (VITAMIN B-12 PO) Take 1 tablet by mouth daily.     docusate sodium (COLACE) 100 MG capsule Take 100 mg by mouth daily as needed for mild constipation or moderate constipation.      eye wash (,SODIUM/POTASSIUM/SOD CHLORIDE,) SOLN Place 1 drop into both eyes 3 (three) times  a week.     ferrous gluconate (FERGON) 324 MG tablet Take 324 mg by mouth daily with breakfast.     fexofenadine (ALLEGRA) 180 MG tablet Take 180 mg by mouth daily.     Glucosamine-Chondroitin-MSM-D3 TABS Take 1 tablet by mouth daily.     isosorbide mononitrate (IMDUR) 60 MG 24 hr tablet TAKE 1.5 TABLETS (90 MG TOTAL) BY MOUTH DAILY. 135 tablet 3   losartan (COZAAR) 50 MG tablet Take 0.5 tablets (25 mg total) by mouth daily. 07/02/22 Dose decrease     metFORMIN (GLUCOPHAGE) 500 MG tablet Take 500 mg by mouth daily with breakfast.     Multiple Vitamin (MULTIVITAMIN WITH MINERALS) TABS tablet Take 1 tablet by mouth daily.     Multiple Vitamins-Minerals (PRESERVISION AREDS 2 PO) Take 1 tablet by mouth in the morning and at bedtime. EYE VITAMIN     nebivolol (BYSTOLIC) 10 MG tablet Take 1 tablet (10 mg total) by mouth daily. 30 tablet 6   nitroGLYCERIN (NITROLINGUAL) 0.4  MG/SPRAY spray PLACE 1 SPRAY UNDER THE TONGUE EVERY 5 (FIVE) MINUTES X 3 DOSES AS NEEDED FOR CHEST PAIN (IF NO RELIEF AFTER 3RD DOSE, PROCEED TO THE ED FOR AN EVALUATION). 4.9 g 3   Omega-3 Fatty Acids (FISH OIL PO) Take 1 capsule by mouth daily.     pantoprazole (PROTONIX) 40 MG tablet TAKE 1 TABLET BY MOUTH EVERY DAY 90 tablet 3   No current facility-administered medications for this visit.     Past Surgical History:  Procedure Laterality Date   ABDOMINAL HYSTERECTOMY     APPENDECTOMY     BREAST LUMPECTOMY WITH RADIOACTIVE SEED LOCALIZATION Right 04/16/2021   Procedure: RIGHT BREAST LUMPECTOMY WITH RADIOACTIVE SEED LOCALIZATION;  Surgeon: Almond Lint, MD;  Location: [facility] OR;  Service: General;  Laterality: Right;   BREAST SURGERY     CARDIAC CATHETERIZATION N/A 08/17/2015   Procedure: Left Heart Cath and Coronary Angiography;  Surgeon: Kathleene Hazel, MD;  Location: [facility] INVASIVE CV LAB;  Service: Cardiovascular;  Laterality: N/A;   CHOLECYSTECTOMY     CORONARY ARTERY BYPASS GRAFT  2006   [name], single vessel   CORONARY CTO INTERVENTION N/A 08/15/2020   Procedure: CORONARY CTO INTERVENTION;  Surgeon: [name], [name], MD;  Location: [facility] INVASIVE CV LAB;  Service: Cardiovascular;  Laterality: N/A;   CORONARY STENT INTERVENTION N/A 08/15/2020   Procedure: CORONARY STENT INTERVENTION;  Surgeon: [name], [name], MD;  Location: [facility] INVASIVE CV LAB;  Service: Cardiovascular;  Laterality: N/A;   CORONARY STENT INTERVENTION N/A 10/18/2020   Procedure: CORONARY STENT INTERVENTION;  Surgeon: [name], [name], MD;  Location: [facility] INVASIVE CV LAB;  Service: Cardiovascular;  Laterality: N/A;   CORONARY STENT INTERVENTION  10/18/2020   CORONARY ULTRASOUND/IVUS N/A 08/15/2020   Procedure: Intravascular Ultrasound/IVUS;  Surgeon: [name], [name], MD;  Location: [facility] INVASIVE CV LAB;  Service: Cardiovascular;  Laterality: N/A;   CORONARY ULTRASOUND/IVUS N/A 10/18/2020   Procedure: Intravascular  Ultrasound/IVUS;  Surgeon: [name], [name], MD;  Location: [facility] INVASIVE CV LAB;  Service: Cardiovascular;  Laterality: N/A;   CYSTOSCOPY W/ URETERAL STENT PLACEMENT Left 06/20/2021   Procedure: CYSTOSCOPY WITH RETROGRADE PYELOGRAM/URETERAL STENT PLACEMENT;  Surgeon: Alfredo Martinez, MD;  Location: [facility] ORS;  Service: Urology;  Laterality: Left;   CYSTOSCOPY WITH RETROGRADE PYELOGRAM, URETEROSCOPY AND STENT PLACEMENT Bilateral 07/05/2021   Procedure: CYSTOSCOPY WITH BILATERAL RETROGRADE PYELOGRAM, LEFT URETEROSCOPY AND LEFT STENT EXCHANGE;  Surgeon: Milderd Meager., MD;  Location: [facility] ORS;  Service: Urology;  Laterality: Bilateral;  HOLMIUM LASER APPLICATION Left 07/05/2021   Procedure: HOLMIUM LASER APPLICATION;  Surgeon: Milderd Meager., MD;  Location: [facility] ORS;  Service: Urology;  Laterality: Left;   LEFT HEART CATH AND CORONARY ANGIOGRAPHY N/A 07/10/2020   Procedure: LEFT HEART CATH AND CORONARY ANGIOGRAPHY;  Surgeon: Lyn Records, MD;  Location: [facility] INVASIVE CV LAB;  Service: Cardiovascular;  Laterality: N/A;   STONE EXTRACTION WITH BASKET Left 07/05/2021   Procedure: STONE EXTRACTION WITH BASKET;  Surgeon: Milderd Meager., MD;  Location: [facility] ORS;  Service: Urology;  Laterality: Left;     Allergies  Allergen Reactions   Ranolazine Er Other (See Comments)    Dizziness    Sulfonamide Derivatives     Unknown      Family History  Problem Relation Age of Onset   Cancer Sister        Breast   CAD Other        Family history     Social History Kimberly Flowers reports that she has never smoked. She has never been exposed to tobacco smoke. She has never used smokeless tobacco. Kimberly Flowers reports no history of alcohol use.   Review of Systems CONSTITUTIONAL: No weight loss, fever, chills, weakness or fatigue.  HEENT: Eyes: No visual loss, blurred vision, double vision or yellow sclerae.No hearing loss, sneezing, congestion, runny nose or sore throat.  SKIN: No rash or  itching.  CARDIOVASCULAR: per hpi RESPIRATORY: No shortness of breath, cough or sputum.  GASTROINTESTINAL: No anorexia, nausea, vomiting or diarrhea. No abdominal pain or blood.  GENITOURINARY: No burning on urination, no polyuria NEUROLOGICAL: No headache, dizziness, syncope, paralysis, ataxia, numbness or tingling in the extremities. No change in bowel or bladder control.  MUSCULOSKELETAL: No muscle, back pain, joint pain or stiffness.  LYMPHATICS: No enlarged nodes. No history of splenectomy.  PSYCHIATRIC: No history of depression or anxiety.  ENDOCRINOLOGIC: No reports of sweating, cold or heat intolerance. No polyuria or polydipsia.  Marland Kitchen   Physical Examination Today's Vitals   12/30/22 0957  BP: 108/60  Pulse: 64  SpO2: 93%  Weight: 165 lb (74.8 kg)  Height: 5\' 5"  (1.651 m)   Body mass index is 27.46 kg/m.  Gen: resting comfortably, no acute distress HEENT: no scleral icterus, pupils equal round and reactive, no palptable cervical adenopathy,  CV: RRR, no m/rg, no jvd Resp: Clear to auscultation bilaterally GI: abdomen is soft, non-tender, non-distended, normal bowel sounds, no hepatosplenomegaly MSK: extremities are warm, no edema.  Skin: warm, no rash Neuro:  no focal deficits Psych: appropriate affect   Diagnostic Studies  She underwent coronary angiography on 08/17/2015 which demonstrated the following:   1. Single vessel CAD with patent stent mid LAD with mild restenosis. 2. Severe disease in a small caliber diagonal Kimberly Flowers. The ostium of this Kimberly Flowers is jailed by the LAD stent. The vessel is 1.5 mm and too small for PCI.   3. Mild non-obstructive disease in the RCA and Circumflex artery.   4. Normal LV systolic function 5. Known occlusion of the LIMA graft to the LAD (non injected)   Echocardiogram on 08/17/15 showed normal left ventricular systolic function and regional wall motion, EF 55-60%, grade 1 diastolic dysfunction, and aortic valve sclerosis with trivial  regurgitation.   Nuclear stress test 09/15/17 showed no significant myocardial ischemia or scar.  Blood pressure demonstrated a hypertensive response to exercise.  [facility] treadmill score was low, LVEF 58%.   06/2021 echo IMPRESSIONS     1. LV function low  normal to mildly reduced (EF 50).   2. Left ventricular ejection fraction, by estimation, is 45 to 50%. The  left ventricle has mildly decreased function. The left ventricle  demonstrates global hypokinesis. Left ventricular diastolic parameters are  consistent with Grade I diastolic  dysfunction (impaired relaxation). Elevated left atrial pressure.   3. Right ventricular systolic function is normal. The right ventricular  size is normal. Tricuspid regurgitation signal is inadequate for assessing  PA pressure.   4. The mitral valve is normal in structure. No evidence of mitral valve  regurgitation. No evidence of mitral stenosis.   5. The aortic valve is tricuspid. Aortic valve regurgitation is trivial.  Mild aortic valve sclerosis is present, with no evidence of aortic valve  stenosis.   6. The inferior vena cava is normal in size with greater than 50%  respiratory variability, suggesting right atrial pressure of 3 mmHg.      Assessment and Plan  1. CAD with chronic stable angina - after her PCI procedurs reports chest pain symptoms were less frequent but never fully went away - chronic stable angina overall stable though still bothersome - on max dose norvasc. HRs 50-60s would not titrate beta blocker. Allergy to ranexa. Some dizziness on imdur, we will try stopping her losartan to see if can tolerate imdur 60mg  in AM and 30mg  in PM. Potentially add back low dose losartan in the near future.  - update [place] 1 week on symptoms.  - EKG SR, chronic ST/T changes   2. HTN -stopping losartan as listed above - bp is at goal   3. Hyperlipidemia - continue current meds, f/u upcoming pcp labs      Antoine Poche, M.D.

## 2023-01-06 ENCOUNTER — Telehealth: Payer: Self-pay | Admitting: Cardiology

## 2023-01-06 NOTE — Telephone Encounter (Signed)
Pt c/o medication issue:  1. Name of Medication:   isosorbide mononitrate (IMDUR) 60 MG 24 hr tablet    2. How are you currently taking this medication (dosage and times per day)? Take 1 tablet (60 mg total) by mouth every morning AND 0.5 tablets (30 mg total) every evening.   3. Are you having a reaction (difficulty breathing--STAT)? No  4. What is your medication issue? Pt stated this above medication is not working for her and causing her to have headaches, not sleeping well and some pains. She'd like a callback in regards to this. Please advise.

## 2023-01-07 NOTE — Telephone Encounter (Signed)
No other options as far as medications that can help prevent chest pain unless he is willing to retry a medication called ranexa. From chart had some prior dizziness on this medication. If ongoing chest pains and can't find a medication regimen to control things may need to consider a repeat cath. Let me know if willing to retry ranexa, dose would be 500mg  bid  Dominga Ferry MD

## 2023-01-08 NOTE — Telephone Encounter (Signed)
Left message to return call 

## 2023-01-08 NOTE — Telephone Encounter (Signed)
Can go back to prior dose of imdur which was 60mg , I would not stop all together as I think there is some benefit to staying on the dose she had prevously tolerated. Can restart the losartan as well, had been on 50mg  of losartan daily prior to [place] changing the imdur and can restart that dose. Keep [place] updated on chest pains, as mentioned if progressing over time would need to consider cath  Dominga Ferry MD

## 2023-01-08 NOTE — Telephone Encounter (Signed)
Pt calling for an update.

## 2023-01-08 NOTE — Telephone Encounter (Signed)
Spoke with patient.  States she is not willing to retry the Ranexa and wants to know if she can stop the Imdur and go back on the Losartan.  Also, states she is not sure that she wants to do cath at this time either, she will have to pray about it.

## 2023-01-09 MED ORDER — ISOSORBIDE MONONITRATE ER 60 MG PO TB24: 60.0000 mg | ORAL_TABLET | Freq: Every day | ORAL | Status: DC

## 2023-01-09 MED ORDER — LOSARTAN POTASSIUM 50 MG PO TABS: 100.0000 mg | ORAL_TABLET | Freq: Every day | ORAL | Status: DC

## 2023-01-09 NOTE — Telephone Encounter (Signed)
Notified patient in office as she walked into lobby at 12:15  - stated she was having problems with her phone.    Patient notified and verbalized understanding.   She will continue the Imdur at 60mg  daily & go back to the Losartan at 50mg  daily.

## 2023-01-09 NOTE — Telephone Encounter (Signed)
Pt returning call

## 2023-01-09 NOTE — Telephone Encounter (Signed)
Left message to return call 

## 2023-02-11 ENCOUNTER — Telehealth: Payer: Self-pay | Admitting: *Deleted

## 2023-02-11 NOTE — Telephone Encounter (Signed)
   Pre-operative Risk Assessment    Patient Name: Kimberly Flowers  DOB: [birth year] MRN: [id number]      Request for Surgical Clearance    Procedure:   LEFT MIDDLE FINGER GANGLION CYST EXCISION  Date of Surgery:  Clearance 03/10/23                                 Surgeon:  DR. Serafina Royals Surgeon's Group or Practice Name:  [facility]  Phone number:  [phone] Fax number:  [phone]   Type of Clearance Requested:   - Medical  - Pharmacy:  Hold Aspirin and Clopidogrel (Plavix)     Type of Anesthesia:  Local Gildardo Griffes   Additional requests/questions:    Elpidio Anis   02/11/2023, 4:35 PM

## 2023-02-12 NOTE — Telephone Encounter (Signed)
Pt is scheduled 06/27 at 9am with Sharlene Dory, NP. Her appt notes are updated to reflect that a preop clearance is needed. Will send to provider.

## 2023-02-12 NOTE — Telephone Encounter (Signed)
   Name: SAMAYA BYL  DOB: 1941-03-26  MRN: [id number]  Primary Cardiologist: Dina Rich, MD  Chart reviewed as part of pre-operative protocol coverage. Because of Keeva Schnaidt Schuelke's past medical history and time since last visit, she will require a follow-up telephone visit in order to better assess preoperative cardiovascular risk.  Pre-op covering staff: - Please schedule appointment and call patient to inform them. If patient already had an upcoming appointment within acceptable timeframe, please add "pre-op clearance" to the appointment notes so provider is aware. - Please contact requesting surgeon's office via preferred method (i.e, phone, fax) to inform them of need for appointment prior to surgery.    Should be okay to hold Plavix x 5 to 7 days prior to procedure if asymptomatic at the time of telephone call.  Sharlene Dory, PA-C  02/12/2023, 7:50 AM

## 2023-03-04 NOTE — Telephone Encounter (Signed)
Patient was returning call. Please advise ?

## 2023-03-04 NOTE — Telephone Encounter (Signed)
Left a message for the pt to call back to pre op as we just got an update that her surgery has been moved up. Procedure has been moved up by 1 day, however this one day makes a difference in regard to recommending holding her blood thinner. Pt has appt 03/05/23 with Sharlene Dory, NP. Left message Jerel Shepherd for the surgeon's office once the pt is cleared we will fax notes.

## 2023-03-04 NOTE — Telephone Encounter (Signed)
I s/w the pt and she has been advised ok to begin holding her Plavix Should be okay to hold Plavix x 5 to 7 days prior to procedure if asymptomatic at the time of telephone call.   Sharlene Dory, PA-C  02/12/2023, 7:50 AM    Pt states she did take Plavix this morning. I advised pt to hold Plavix now until her procedure. Surgeon will let her know when it is safe to resume her Plavix. Pt has appt 03/05/23. See previous notes from today.   Pt asked if she has to hold metformin. I advised her to contact the surgeon's office about metformin. Pt thanked me for the call and our help.

## 2023-03-04 NOTE — Telephone Encounter (Signed)
Caller reported patient surgery is now scheduled on 7/1.

## 2023-03-05 ENCOUNTER — Encounter: Payer: Self-pay | Admitting: Nurse Practitioner

## 2023-03-05 ENCOUNTER — Ambulatory Visit: Payer: Medicare Other | Attending: Nurse Practitioner | Admitting: Nurse Practitioner

## 2023-03-05 VITALS — BP 110/68 | HR 71 | Ht 65.5 in | Wt 162.6 lb

## 2023-03-05 DIAGNOSIS — Z01810 Encounter for preprocedural cardiovascular examination: Secondary | ICD-10-CM

## 2023-03-05 DIAGNOSIS — G8929 Other chronic pain: Secondary | ICD-10-CM | POA: Diagnosis present

## 2023-03-05 DIAGNOSIS — E785 Hyperlipidemia, unspecified: Secondary | ICD-10-CM | POA: Diagnosis present

## 2023-03-05 DIAGNOSIS — I10 Essential (primary) hypertension: Secondary | ICD-10-CM

## 2023-03-05 DIAGNOSIS — R079 Chest pain, unspecified: Secondary | ICD-10-CM | POA: Diagnosis present

## 2023-03-05 DIAGNOSIS — I25119 Atherosclerotic heart disease of native coronary artery with unspecified angina pectoris: Secondary | ICD-10-CM

## 2023-03-05 DIAGNOSIS — I872 Venous insufficiency (chronic) (peripheral): Secondary | ICD-10-CM | POA: Diagnosis present

## 2023-03-05 MED ORDER — LOSARTAN POTASSIUM 50 MG PO TABS: 50.0000 mg | ORAL_TABLET | Freq: Every day | ORAL | Status: AC

## 2023-03-05 NOTE — Patient Instructions (Signed)
Medication Instructions:  Your physician recommends that you continue on your current medications as directed. Please refer to the Current Medication list given to you today.  Labwork: none  Testing/Procedures: none  Follow-Up: Your physician recommends that you schedule a follow-up appointment in: 6 months with [facility]  Any Other Special Instructions Will Be Listed Below (If Applicable).  If you need a refill on your cardiac medications before your next appointment, please call your pharmacy. 

## 2023-03-05 NOTE — Progress Notes (Signed)
Cardiology Office Note:  .   Date:  03/05/2023  ID:  [name], DOB 08/15/1941, MRN [id number] PCP: Ignatius Specking, MD  [facility] Providers Cardiologist:  Dina Rich, MD    History of Present Illness: .   Kimberly Flowers is a 82 y.o. female with PMH of CAD, s/p CABG, hx of chronic chest pain, hypertension, hyperlipidemia, and history of breast cancer, who presents today for preoperative cardiovascular risk assessment.  Previous CV history includes CABG, with subsequent occlusion of LIMA-LAD graft, history of receiving drug-eluting stent to LAD.  NST in 2018 low risk.  Underwent LHC in 2021-report noted below.  Had PCI to RCA CTO with Dr. [name] in 2021.  Recommended for indefinite DAPT given her extensive stents.  PCI added to mid LAD in 2022.  Last seen by Dr. Dina Rich on December 30, 2022.  She reported chronic stable angina overall stable, though reported be bothersome.  GDMT limited due to allergy to Ranexa, had baseline bradycardia, therefore BB avoided.  Reported some dizziness on Imdur.  Losartan was stopped to see if she could tolerate Imdur 60 mg in a.m., 30 mg in p.m.  She called back to the office and it was decided to continue Imdur at 60 mg daily, return to losartan at 50 mg daily.  Today she presents for preoperative cardiovascular risk assessment.  She is pending left middle finger ganglion cyst excision, surgery set for March 10, 2023 with Dr. Serafina Royals of [facility].  She presents today for medical clearance.  She is doing well today.  Continues to note chronic angina, stable per her report.  She describes it as intermittent, mid center chest pain, not severe per her report, occasionally takes an extra baby aspirin and nitroglycerin spray helps relieve the symptoms. Denies any shortness of breath, palpitations, syncope, presyncope, dizziness, orthopnea, PND, swelling or significant weight changes, acute bleeding, or claudication.  Studies Reviewed: .        Echo 06/2021: 1. LV function low normal to mildly reduced (EF 50).   2. Left ventricular ejection fraction, by estimation, is 45 to 50%. The  left ventricle has mildly decreased function. The left ventricle  demonstrates global hypokinesis. Left ventricular diastolic parameters are  consistent with Grade I diastolic  dysfunction (impaired relaxation). Elevated left atrial pressure.   3. Right ventricular systolic function is normal. The right ventricular  size is normal. Tricuspid regurgitation signal is inadequate for assessing  PA pressure.   4. The mitral valve is normal in structure. No evidence of mitral valve  regurgitation. No evidence of mitral stenosis.   5. The aortic valve is tricuspid. Aortic valve regurgitation is trivial.  Mild aortic valve sclerosis is present, with no evidence of aortic valve  stenosis.   6. The inferior vena cava is normal in size with greater than 50%  respiratory variability, suggesting right atrial pressure of 3 mmHg.  LHC 10/18/2020, coronary stent intervention) Mid LAD lesion is 75% stenosed. Mid LAD to Dist LAD lesion is 35% stenosed. A drug-eluting stent was successfully placed using a STENT RESOLUTE ONYX 2.5X34. Post intervention, there is a 0% residual stenosis. Previously placed RPDA drug eluting stent is widely patent. Previously placed Mid RCA to Dist RCA drug eluting stent is widely patent. Previously placed Ost RCA to Mid RCA drug eluting stent is widely patent.   1. Widely patent RCA vessel from prior CTO PCI 2. Successful PCI of the proximal to mid LAD with OCT guidance and  DES x 1 with 2.5 x 34 mm Resolute stent post dilated with 2.75 mm Sikes balloon to 18 atm   Plan: will observe overnight tonight. Continue DAPT indefinitely. Anticipate DC in am.  Coronary stent intervention 08/2020: 3rd Diag-1 lesion is 90% stenosed. 3rd Diag-2 lesion is 70% stenosed. Mid LAD lesion is 65% stenosed. Mid LAD to Dist LAD lesion is 25%  stenosed. 2nd Diag lesion is 45% stenosed. Mid RCA to Dist RCA lesion is 100% stenosed. A drug-eluting stent was successfully placed using a SYNERGY XD 3.50X38. Ost RCA to Mid RCA lesion is 80% stenosed. A drug-eluting stent was successfully placed using a SYNERGY XD 4.0X48. RPDA lesion is 90% stenosed. A drug-eluting stent was successfully placed using a SYNERGY XD 2.50X28. A drug-eluting stent was successfully placed using a SYNERGY XD 4.0X12. Post intervention, there is a 0% residual stenosis. Post intervention, there is a 0% residual stenosis. Post intervention, there is a 0% residual stenosis.   1. Successful CTO PCI of the RCA from the proximal vessel into the PDA with DES x 4 and IVUS guidance.   Plan: DAPT indefinitely given extensive nature of stents. We will see how she does clinically. If she has persistent anginal symptoms we could address the stenosis in the LAD at a later time.   LHC 07/2020: Severe two-vessel coronary artery disease involving the dominant right coronary and the previously stented mid to distal LAD which involves to moderate to large diagonals. Left main is widely patent LAD contains segmental 60 to 70% mid stenosis proximal to a previously placed stent in the distal segment.  The stent jails a large and moderate sized second and third diagonals.  The third diagonal has proximal and mid significant stenoses.  LAD supplies collaterals to the right coronary via septal perforators and around the apex. Circumflex gives origin to 2 obtuse marginal branches which are free of any significant obstruction.  The proximal circumflex contains eccentric 20% stenosis. Ramus intermedius is large and widely patent Right coronary is dominant and totally occluded in the mid to distal segment and receives collaterals from right to right and left to right. Normal LV function.  LVEDP 8 mmHg.  EF 55%.   RECOMMENDATIONS:   We will discuss whether RCA is approachable by the CTO  team. Would up titrate nitrate therapy to improve angina. If refractory symptoms, consider repeat coronary bypass to the second and third diagonals as well as the distal LAD.  Would also need to have LV branch of RCA and PDA grafted at the same time.  If only interventional approach is stenting of the mid LAD this would lead to a double layer of stent and will have limited long-term patency.   LHC 08/2015:  Prox RCA lesion, 20% stenosed. Ost Cx to Prox Cx lesion, 20% stenosed. 3rd Diag lesion, 90% stenosed. Mid LAD to Dist LAD lesion, 20% stenosed. The lesion was previously treated with a stent (unknown type) greater than two years ago. Ost 3rd Diag to 3rd Diag lesion, 40% stenosed. Ost 2nd Diag lesion, 60% stenosed. Prox LAD to Mid LAD lesion, 40% stenosed. The left ventricular systolic function is normal. LIMA was not injected . Origin lesion, 100% stenosed.   1. Single vessel CAD with patent stent mid LAD with mild restenosis. 2. Severe disease in a small caliber diagonal branch. The ostium of this branch is jailed by the LAD stent. The vessel is 1.5 mm and too small for PCI.  3. Mild non-obstructive disease in the RCA  and Circumflex artery.  4. Normal LV systolic function 5. Known occlusion of the LIMA graft to the LAD (non injected)   Recommendations: Continue medical management of CAD.       Physical Exam:   VS:  BP 110/68   Pulse 71   Ht 5' 5.5" (1.664 m)   Wt 162 lb 9.6 oz (73.8 kg)   SpO2 96%   BMI 26.65 kg/m    Wt Readings from Last 3 Encounters:  03/05/23 162 lb 9.6 oz (73.8 kg)  12/30/22 165 lb (74.8 kg)  06/13/22 159 lb 6.4 oz (72.3 kg)    GEN: Well nourished, well developed in no acute distress NECK: No JVD; No carotid bruits CARDIAC: S1/S2, RRR, no murmurs, rubs, gallops RESPIRATORY:  Clear to auscultation without rales, wheezing or rhonchi  ABDOMEN: Soft, non-tender, non-distended EXTREMITIES: Nonpitting edema to bilateral lower extremities, (L>R); No  deformity   ASSESSMENT AND PLAN: .   Preoperative cardiovascular risk assessment Ms. Sthilaire's perioperative risk of a major cardiac event is 0.9% according to the Revised Cardiac Risk Index (RCRI).  Therefore, she is at low risk for perioperative complications.   Her functional capacity is good at > 4 METs according to the Duke Activity Status Index (DASI). Recommendations: According to ACC/AHA guidelines, no further cardiovascular testing needed.  The patient may proceed to surgery at acceptable risk.   Antiplatelet and/or Anticoagulation Recommendations: Okay to continue aspirin prior to procedure.  Okay to hold Plavix for 5 to 7 days prior to procedure and resume postop when felt safe to do so.  Will route note to requesting party.  CAD, s/p CABG, chronic chest pain Denies any active chest pain today.  History of longstanding chronic angina, stable per her report.  Medical therapy limited - reported allergy to Ranexa, has hx baseline bradycardia, therefore BB avoided.  Reported some dizziness on Imdur.  Tolerating current medication regimen well.  No medication changes at this time. Heart healthy diet and regular cardiovascular exercise encouraged.  ED precautions discussed.  HTN Blood pressure stable today.  Continue current medication regimen. Discussed to monitor BP at home at least 2 hours after medications and sitting for 5-10 minutes. Heart healthy diet and regular cardiovascular exercise encouraged.   HLD Recent LDL 63.  LDL goal less than 55.  Continue medication regimen at this time.  Discussed lifestyle modifications, including diet and exercise.  She verbalized understanding.  At next office visit, plan to repeat FLP and LFT and if no improvement, plan to initiate Zetia to medication regimen.  Chronic venous insufficiency  Stable leg edema per her report.  Discussed low-salt, heart healthy diet and leg elevation.  Recommended wearing compression stockings, stated she will try this.  Heart healthy diet and regular cardiovascular exercise encouraged.   Dispo: Follow-up with me or APP in 6 months or sooner anything changes.  Signed, Sharlene Dory, NP

## 2023-03-06 ENCOUNTER — Telehealth: Payer: Self-pay | Admitting: Cardiology

## 2023-03-06 NOTE — Telephone Encounter (Signed)
Requesting a copy of patient last EKG, office notes and stress or echo test. Fax #[phone]. Please advise

## 2023-03-09 HISTORY — PX: CYST EXCISION: SHX5701

## 2023-04-27 ENCOUNTER — Ambulatory Visit (HOSPITAL_COMMUNITY): Payer: Medicare Other

## 2023-04-27 ENCOUNTER — Ambulatory Visit (HOSPITAL_COMMUNITY)
Admission: RE | Admit: 2023-04-27 | Discharge: 2023-04-27 | Disposition: A | Discharge status: Home / Self Care | Payer: Medicare Other | Source: Ambulatory Visit | Attending: Urology | Admitting: Urology

## 2023-04-27 DIAGNOSIS — N281 Cyst of kidney, acquired: Secondary | ICD-10-CM | POA: Insufficient documentation

## 2023-05-05 ENCOUNTER — Telehealth: Payer: Self-pay

## 2023-05-05 NOTE — Telephone Encounter (Signed)
Patient left a voice message 05-05-2023.  Calling to find out results from Renal Ultra Sound.  Please advise.  Call:  [phone]

## 2023-05-06 NOTE — Telephone Encounter (Signed)
Patient is made aware Dr. Pete Glatter is in the [place] location. Patient was given phone number

## 2023-05-08 NOTE — Telephone Encounter (Signed)
Return call to patient. Patient states she had some blood in her urine and want to know what should she do, patient is made aware to increase fluids. Patient states she is concern about her blood pressure being low, patient is advised to go to the emergency dept if blood pressure drops any lower. Patient voiced understanding

## 2023-05-11 NOTE — Progress Notes (Signed)
H&P  Chief Complaint: Blood in urine  History of Present Illness: Kimberly Flowers is a 82 y.o. year old female, previously seen here for a mildly complex left renal cyst and UTIs, presented with recent gross hematuria with "heavy feeling urine", frequency and urgency.  No clots in her urine.  No abdominal or lateralizing flank pain.  Did have sepsis with an obstructing left renal calculus in October 2022, treated with stenting with eventual ureteroscopy.  No recurrent stones since that time.  Did have a recent renal ultrasound for follow-up of left renal cyst.  All cysts in her kidney were considered Bosniak category 1.  No hydronephrosis.  No evidence of calculi.  Past Medical History:  Diagnosis Date   Anemia    Coronary atherosclerosis of native coronary artery    Previous PCI 2007-2008, Dr. Georganna Skeans   Essential hypertension, benign    History of kidney stones    Mixed hyperlipidemia    PAD (peripheral artery disease) (HCC)     Absent right DP   Palpitations    Type 2 diabetes mellitus (HCC)    Vasomotor rhinitis     Past Surgical History:  Procedure Laterality Date   ABDOMINAL HYSTERECTOMY     APPENDECTOMY     BREAST LUMPECTOMY WITH RADIOACTIVE SEED LOCALIZATION Right 04/16/2021   Procedure: RIGHT BREAST LUMPECTOMY WITH RADIOACTIVE SEED LOCALIZATION;  Surgeon: Almond Lint, MD;  Location: [facility] OR;  Service: General;  Laterality: Right;   BREAST SURGERY     CARDIAC CATHETERIZATION N/A 08/17/2015   Procedure: Left Heart Cath and Coronary Angiography;  Surgeon: Kathleene Hazel, MD;  Location: [facility] INVASIVE CV LAB;  Service: Cardiovascular;  Laterality: N/A;   CHOLECYSTECTOMY     CORONARY ARTERY BYPASS GRAFT  2006   [name], single vessel   CORONARY CTO INTERVENTION N/A 08/15/2020   Procedure: CORONARY CTO INTERVENTION;  Surgeon: [name], [name], MD;  Location: [facility] INVASIVE CV LAB;  Service: Cardiovascular;  Laterality: N/A;   CORONARY STENT INTERVENTION N/A 08/15/2020    Procedure: CORONARY STENT INTERVENTION;  Surgeon: [name], [name], MD;  Location: [facility] INVASIVE CV LAB;  Service: Cardiovascular;  Laterality: N/A;   CORONARY STENT INTERVENTION N/A 10/18/2020   Procedure: CORONARY STENT INTERVENTION;  Surgeon: [name], [name], MD;  Location: [facility] INVASIVE CV LAB;  Service: Cardiovascular;  Laterality: N/A;   CORONARY STENT INTERVENTION  10/18/2020   CORONARY ULTRASOUND/IVUS N/A 08/15/2020   Procedure: Intravascular Ultrasound/IVUS;  Surgeon: [name], [name], MD;  Location: [facility] INVASIVE CV LAB;  Service: Cardiovascular;  Laterality: N/A;   CORONARY ULTRASOUND/IVUS N/A 10/18/2020   Procedure: Intravascular Ultrasound/IVUS;  Surgeon: [name], [name], MD;  Location: [facility] INVASIVE CV LAB;  Service: Cardiovascular;  Laterality: N/A;   CYSTOSCOPY W/ URETERAL STENT PLACEMENT Left 06/20/2021   Procedure: CYSTOSCOPY WITH RETROGRADE PYELOGRAM/URETERAL STENT PLACEMENT;  Surgeon: Alfredo Martinez, MD;  Location: [facility] ORS;  Service: Urology;  Laterality: Left;   CYSTOSCOPY WITH RETROGRADE PYELOGRAM, URETEROSCOPY AND STENT PLACEMENT Bilateral 07/05/2021   Procedure: CYSTOSCOPY WITH BILATERAL RETROGRADE PYELOGRAM, LEFT URETEROSCOPY AND LEFT STENT EXCHANGE;  Surgeon: Milderd Meager., MD;  Location: [facility] ORS;  Service: Urology;  Laterality: Bilateral;   HOLMIUM LASER APPLICATION Left 07/05/2021   Procedure: HOLMIUM LASER APPLICATION;  Surgeon: Milderd Meager., MD;  Location: [facility] ORS;  Service: Urology;  Laterality: Left;   LEFT HEART CATH AND CORONARY ANGIOGRAPHY N/A 07/10/2020   Procedure: LEFT HEART CATH AND CORONARY ANGIOGRAPHY;  Surgeon: Lyn Records, MD;  Location: [facility] INVASIVE CV LAB;  Service: Cardiovascular;  Laterality: N/A;   STONE EXTRACTION WITH BASKET Left 07/05/2021   Procedure: STONE EXTRACTION WITH BASKET;  Surgeon: Milderd Meager., MD;  Location: [facility] ORS;  Service: Urology;  Laterality: Left;    Home Medications:  (Not in a hospital  admission)   Allergies:  Allergies  Allergen Reactions   Ranolazine Er Other (See Comments)    Dizziness    Sulfonamide Derivatives     Unknown    Family History  Problem Relation Age of Onset   Cancer Sister        Breast   CAD Other        Family history    Social History:  reports that she has never smoked. She has never been exposed to tobacco smoke. She has never used smokeless tobacco. She reports that she does not drink alcohol and does not use drugs.  ROS: A complete review of systems was performed.  All systems are negative except for pertinent findings as noted.  Physical Exam:  Vital signs in last 24 hours: @VSRANGES @ General:  Alert and oriented, No acute distress HEENT: Normocephalic, atraumatic Neck: No JVD or lymphadenopathy Cardiovascular: Regular rate  Lungs: Normal inspiratory/expiratory excursion Abdomen: Soft, nontender, nondistended, no abdominal masses Back: No CVA tenderness Extremities: No edema Neurologic: Grossly intact  I have reviewed prior pt notes  I have reviewed notes from referring/previous physicians  I have reviewed urinalysis results  I have independently reviewed prior imaging--recent renal U/S--  Right Kidney: Renal measurements: 11.8 x 6.4 x 5.4 cm = volume: 2 7 mL. Normal parenchymal echogenicity. Cortical cyst arises from the upper pole, 1.7 x 1.4 x 1.6 cm. Tiny cyst also arises from upper pole measuring 6 mm. No other renal masses, no stones and no hydronephrosis. Left Kidney: Renal measurements: 11.7 x 6.3 x 6.0 cm = volume: 229 mL. Normal parenchymal echogenicity. Simple cyst arises near the midpole measuring 2.3 x 2.3 x 2.8 cm. Adjacent smaller cyst at the midpole measures 7 x 5 x 6 mm. No other masses, no stones and no hydronephrosis. Bladder: Appears normal for degree of bladder distention. Other: None. IMPRESSION: 1. No acute findings. No hydronephrosis. 2. Bilateral renal cysts as detailed. These are  consistent with Bosniak category 1, simple cysts. No follow-up recommended. Small cyst noted in the upper pole of the right kidney and midpole the left kidney were not visualized on the prior renal ultrasound. The 2 larger cysts currently appear smaller or stable. No new or suspicious renal masses.   I have reviewed prior urine culture   Impression/Assessment:  1.  Gross hematuria with probable UTI  2.  Bosniak category 1 cysts, I do not think we need to follow down the road  3.  History of stone, no evidence of this recently on ultrasound  Plan:  1.  Urine was cultured, I did start her on antibiotic  2.  Reassurance regarding recent renal ultrasound-I do not necessarily think she needs continued surveillance of her cysts  3.  Office visit in 1 month with Evette Georges, NP for urine check  Kimberly Flowers 05/11/2023, 5:03 PM  Kimberly Millard. Precilla Purnell MD

## 2023-05-12 ENCOUNTER — Ambulatory Visit: Payer: Medicare Other | Admitting: Urology

## 2023-05-12 ENCOUNTER — Encounter: Payer: Self-pay | Admitting: Urology

## 2023-05-12 VITALS — BP 132/70 | HR 90

## 2023-05-12 DIAGNOSIS — N3001 Acute cystitis with hematuria: Secondary | ICD-10-CM

## 2023-05-12 DIAGNOSIS — Z87440 Personal history of urinary (tract) infections: Secondary | ICD-10-CM

## 2023-05-12 DIAGNOSIS — R310 Gross hematuria: Secondary | ICD-10-CM | POA: Diagnosis not present

## 2023-05-12 DIAGNOSIS — R109 Unspecified abdominal pain: Secondary | ICD-10-CM | POA: Diagnosis not present

## 2023-05-12 DIAGNOSIS — R10A0 Flank pain, unspecified side: Secondary | ICD-10-CM

## 2023-05-12 DIAGNOSIS — Z87442 Personal history of urinary calculi: Secondary | ICD-10-CM

## 2023-05-12 DIAGNOSIS — N281 Cyst of kidney, acquired: Secondary | ICD-10-CM

## 2023-05-12 LAB — URINALYSIS, ROUTINE W REFLEX MICROSCOPIC
Bilirubin, UA: NEGATIVE
Glucose, UA: NEGATIVE
Ketones, UA: NEGATIVE
Nitrite, UA: NEGATIVE
Specific Gravity, UA: 1.025 (ref 1.005–1.030)
Urobilinogen, Ur: 1 mg/dL (ref 0.2–1.0)
pH, UA: 6 (ref 5.0–7.5)

## 2023-05-12 LAB — MICROSCOPIC EXAMINATION: WBC, UA: >30 /HPF — AB (ref 0–5)

## 2023-05-12 MED ORDER — CEPHALEXIN 500 MG PO CAPS
500.0000 mg | ORAL_CAPSULE | Freq: Two times a day (BID) | ORAL | 0 refills | Status: AC
Start: 2023-05-12 — End: 2023-05-17

## 2023-05-15 LAB — URINE CULTURE

## 2023-05-18 ENCOUNTER — Telehealth: Payer: Self-pay | Admitting: Urology

## 2023-05-18 NOTE — Telephone Encounter (Signed)
Patient is calling for her urine culture results  , she wants to know what is going on?

## 2023-05-19 NOTE — Telephone Encounter (Signed)
Patient is aware of your response to her culture.  She states she has finished her antibiotic and she is still having burning and frequency at this time.

## 2023-05-19 NOTE — Telephone Encounter (Signed)
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  Are you able to call in an alternative antibiotic at this time?

## 2023-05-20 ENCOUNTER — Other Ambulatory Visit: Payer: Self-pay | Admitting: Urology

## 2023-05-20 DIAGNOSIS — N3001 Acute cystitis with hematuria: Secondary | ICD-10-CM

## 2023-05-20 MED ORDER — NITROFURANTOIN MONOHYD MACRO 100 MG PO CAPS
100.0000 mg | ORAL_CAPSULE | Freq: Two times a day (BID) | ORAL | 0 refills | Status: AC
Start: 2023-05-20 — End: 2023-05-27

## 2023-05-20 NOTE — Telephone Encounter (Signed)
Patient is aware and informed that if her symptoms do not improve she will need to come by the office for a ua/uc.  Patient voiced understanding.

## 2023-06-03 NOTE — Progress Notes (Signed)
Name: Kimberly Flowers DOB: 1941-03-01 MRN: [id number]  History of Present Illness: Kimberly Flowers is a 82 y.o. female who presents today for follow up visit at [facility] [place]. - GU History: 1. Kidney stone x1. - History of sepsis with an obstructing left renal calculus in October 2022, treated with stenting with eventual ureteroscopy. No recurrent stones since that time. 2. Mildly complex left renal cyst.   Recent imaging:  - 04/27/2023: RUS showed bilateral simple renal cysts, Bosniak category 1. No GU stones, masses, or hydronephrosis.  At last visit with Dr. Retta Diones on 05/12/2023: - Seen for recent gross hematuria with "heavy feeling urine", frequency and urgency. Urine culture positive for E. Coli; treated initially with Keflex however symptoms persisted so she was then retreated with Macrobid starting on 05/20/2023. - Reassurance regarding recent renal ultrasound-I do not necessarily think she needs continued surveillance of her cysts.  Today: She reports that her urinary symptoms have resolved. increased Denies urinary urgency, frequency, nocturia, dysuria, gross hematuria, hesitancy, straining to void, or sensations of incomplete emptying.  She denies acute flank pain, abdominal pain, fevers, nausea, or vomiting.  She denies history of recurrent UTI.   Fall Screening: Do you usually have a device to assist in your mobility? No   Medications: Current Outpatient Medications  Medication Sig Dispense Refill   acetaminophen (TYLENOL) 500 MG tablet Take 500-1,000 mg by mouth every 6 (six) hours as needed for moderate pain.     amLODipine (NORVASC) 10 MG tablet TAKE 1 TABLET BY MOUTH EVERY DAY 90 tablet 1   anastrozole (ARIMIDEX) 1 MG tablet Take 1 mg by mouth daily.     Ascorbic Acid (VITAMIN C WITH ROSE HIPS) 500 MG tablet Take 1,000 mg by mouth daily.     aspirin 81 MG EC tablet Take 81 mg by mouth daily.     atorvastatin (LIPITOR) 80 MG tablet TAKE 1 TABLET BY  MOUTH EVERY DAY 90 tablet 1   Calcium Carb-Cholecalciferol (CALCIUM PLUS VITAMIN D3) 600-500 MG-UNIT CAPS Take 1 tablet by mouth daily.     cholecalciferol (VITAMIN D3) 25 MCG (1000 UNIT) tablet Take 1,000 Units by mouth daily.     clopidogrel (PLAVIX) 75 MG tablet TAKE 1 TABLET BY MOUTH EVERY DAY 90 tablet 3   Cyanocobalamin (VITAMIN B-12 PO) Take 1 tablet by mouth daily.     docusate sodium (COLACE) 100 MG capsule Take 100 mg by mouth daily as needed for mild constipation or moderate constipation.      eye wash (,SODIUM/POTASSIUM/SOD CHLORIDE,) SOLN Place 1 drop into both eyes 3 (three) times a week.     ferrous gluconate (FERGON) 324 MG tablet Take 324 mg by mouth daily with breakfast.     fexofenadine (ALLEGRA) 180 MG tablet Take 180 mg by mouth daily.     Glucosamine-Chondroitin-MSM-D3 TABS Take 1 tablet by mouth daily.     isosorbide mononitrate (IMDUR) 60 MG 24 hr tablet Take 1 tablet (60 mg total) by mouth daily.     losartan (COZAAR) 50 MG tablet Take 1 tablet (50 mg total) by mouth daily.     metFORMIN (GLUCOPHAGE) 500 MG tablet Take 500 mg by mouth daily with breakfast.     Multiple Vitamin (MULTIVITAMIN WITH MINERALS) TABS tablet Take 1 tablet by mouth daily.     Multiple Vitamins-Minerals (PRESERVISION AREDS 2 PO) Take 1 tablet by mouth in the morning and at bedtime. EYE VITAMIN     nebivolol (BYSTOLIC) 10 MG tablet Take 1  tablet (10 mg total) by mouth daily. 30 tablet 6   nitroGLYCERIN (NITROLINGUAL) 0.4 MG/SPRAY spray PLACE 1 SPRAY UNDER THE TONGUE EVERY 5 (FIVE) MINUTES X 3 DOSES AS NEEDED FOR CHEST PAIN (IF NO RELIEF AFTER 3RD DOSE, PROCEED TO THE ED FOR AN EVALUATION). 4.9 g 3   Omega-3 Fatty Acids (FISH OIL PO) Take 1 capsule by mouth daily.     pantoprazole (PROTONIX) 40 MG tablet TAKE 1 TABLET BY MOUTH EVERY DAY 90 tablet 3   No current facility-administered medications for this visit.    Allergies: Allergies  Allergen Reactions   Ranolazine Er Other (See Comments)     Dizziness    Sulfonamide Derivatives     Unknown    Past Medical History:  Diagnosis Date   Anemia    Coronary atherosclerosis of native coronary artery    Previous PCI 2007-2008, Dr. Georganna Skeans   Essential hypertension, benign    History of kidney stones    Mixed hyperlipidemia    PAD (peripheral artery disease) (HCC)     Absent right DP   Palpitations    Type 2 diabetes mellitus (HCC)    Vasomotor rhinitis    Past Surgical History:  Procedure Laterality Date   ABDOMINAL HYSTERECTOMY     APPENDECTOMY     BREAST LUMPECTOMY WITH RADIOACTIVE SEED LOCALIZATION Right 04/16/2021   Procedure: RIGHT BREAST LUMPECTOMY WITH RADIOACTIVE SEED LOCALIZATION;  Surgeon: Almond Lint, MD;  Location: [facility] OR;  Service: General;  Laterality: Right;   BREAST SURGERY     CARDIAC CATHETERIZATION N/A 08/17/2015   Procedure: Left Heart Cath and Coronary Angiography;  Surgeon: Kathleene Hazel, MD;  Location: [facility] INVASIVE CV LAB;  Service: Cardiovascular;  Laterality: N/A;   CHOLECYSTECTOMY     CORONARY ARTERY BYPASS GRAFT  2006   [name], single vessel   CORONARY CTO INTERVENTION N/A 08/15/2020   Procedure: CORONARY CTO INTERVENTION;  Surgeon: [name], [name], MD;  Location: [facility] INVASIVE CV LAB;  Service: Cardiovascular;  Laterality: N/A;   CORONARY STENT INTERVENTION N/A 08/15/2020   Procedure: CORONARY STENT INTERVENTION;  Surgeon: [name], [name], MD;  Location: [facility] INVASIVE CV LAB;  Service: Cardiovascular;  Laterality: N/A;   CORONARY STENT INTERVENTION N/A 10/18/2020   Procedure: CORONARY STENT INTERVENTION;  Surgeon: [name], [name], MD;  Location: [facility] INVASIVE CV LAB;  Service: Cardiovascular;  Laterality: N/A;   CORONARY STENT INTERVENTION  10/18/2020   CORONARY ULTRASOUND/IVUS N/A 08/15/2020   Procedure: Intravascular Ultrasound/IVUS;  Surgeon: [name], [name], MD;  Location: [facility] INVASIVE CV LAB;  Service: Cardiovascular;  Laterality: N/A;   CORONARY ULTRASOUND/IVUS N/A 10/18/2020   Procedure:  Intravascular Ultrasound/IVUS;  Surgeon: [name], [name], MD;  Location: [facility] INVASIVE CV LAB;  Service: Cardiovascular;  Laterality: N/A;   CYSTOSCOPY W/ URETERAL STENT PLACEMENT Left 06/20/2021   Procedure: CYSTOSCOPY WITH RETROGRADE PYELOGRAM/URETERAL STENT PLACEMENT;  Surgeon: Alfredo Martinez, MD;  Location: [facility];  Service: Urology;  Laterality: Left;   CYSTOSCOPY WITH RETROGRADE PYELOGRAM, URETEROSCOPY AND STENT PLACEMENT Bilateral 07/05/2021   Procedure: CYSTOSCOPY WITH BILATERAL RETROGRADE PYELOGRAM, LEFT URETEROSCOPY AND LEFT STENT EXCHANGE;  Surgeon: Milderd Meager., MD;  Location: [facility];  Service: Urology;  Laterality: Bilateral;   HOLMIUM LASER APPLICATION Left 07/05/2021   Procedure: HOLMIUM LASER APPLICATION;  Surgeon: Milderd Meager., MD;  Location: [facility];  Service: Urology;  Laterality: Left;   LEFT HEART CATH AND CORONARY ANGIOGRAPHY N/A 07/10/2020   Procedure: LEFT HEART CATH AND CORONARY ANGIOGRAPHY;  Surgeon: Lyn Records,  MD;  Location: [facility] INVASIVE CV LAB;  Service: Cardiovascular;  Laterality: N/A;   STONE EXTRACTION WITH BASKET Left 07/05/2021   Procedure: STONE EXTRACTION WITH BASKET;  Surgeon: Milderd Meager., MD;  Location: [facility];  Service: Urology;  Laterality: Left;   Family History  Problem Relation Age of Onset   Cancer Sister        Breast   CAD Other        Family history   Social History   Socioeconomic History   Marital status: Married    Spouse name: Not on file   Number of children: Not on file   Years of education: Not on file   Highest education level: Not on file  Occupational History   Not on file  Tobacco Use   Smoking status: Never    Passive exposure: Never   Smokeless tobacco: Never  Vaping Use   Vaping status: Never Used  Substance and Sexual Activity   Alcohol use: No    Alcohol/week: 0.0 standard drinks of alcohol   Drug use: No   Sexual activity: Not on file  Other Topics Concern   Not on file  Social  History Narrative   Not on file   Social Determinants of Health   Financial Resource Strain: Low Risk  (12/03/2021)   Received from [facility], [facility]   Overall Financial Resource Strain (CARDIA)    Difficulty of Paying Living Expenses: Not hard at all  Food Insecurity: No Food Insecurity (12/03/2021)   Received from [facility], [facility]   Hunger Vital Sign    Worried About Running Out of Food in the Last Year: Never true    Ran Out of Food in the Last Year: Never true  Transportation Needs: No Transportation Needs (12/03/2021)   Received from [employer] [facility], [facility]   [facility] - Transportation    Lack of Transportation (Medical): No    Lack of Transportation (Non-Medical): No  Physical Activity: Sufficiently Active (06/04/2022)   Received from [facility], [facility]   Exercise Vital Sign    Days of Exercise per Week: 6 days    Minutes of Exercise per Session: 60 min  Stress: No Stress Concern Present (10/16/2022)   Received from [facility], [facility] - Occupational Stress Questionnaire    Feeling of Stress : Only a little  Social Connections: Socially Integrated (06/04/2022)   Received from [facility], [facility]   Social Connection and Isolation Panel [NHANES]    Frequency of Communication with Friends and Family: More than three times a week    Frequency of Social Gatherings with Friends and Family: More than three times a week    Attends Religious Services: More than 4 times per year    Active Member of [employer] or Organizations: Yes    Attends Engineer, structural: More than 4 times per year    Marital Status: Married  Catering manager Violence: Not At Risk (10/16/2022)   Received from [facility], [facility]   Humiliation, Afraid, Rape, and Kick questionnaire    Fear of Current or Ex-Partner: No    Emotionally Abused: No    Physically Abused: No     Sexually Abused: No    SUBJECTIVE  Review of Systems Constitutional: Patient denies any unintentional weight loss or change in strength lntegumentary: Patient denies any rashes or pruritus  Cardiovascular: Patient denies chest pain or syncope Respiratory: Patient denies shortness of breath Gastrointestinal: Patient denies nausea, vomiting, constipation, or diarrhea Musculoskeletal: Patient denies muscle cramps or weakness Neurologic: Patient denies convulsions or seizures Psychiatric: Patient denies memory problems Allergic/Immunologic: Patient denies recent allergic reaction(s) Hematologic/Lymphatic: Patient denies bleeding tendencies Endocrine: Patient denies heat/cold intolerance  GU: As per HPI.  OBJECTIVE Vitals:   06/09/23 1353  BP: (!) 142/79  Pulse: 80  Temp: 98.3 F (36.8 C)   There is no height or weight on file to calculate BMI.  Physical Examination Constitutional: No obvious distress; patient is non-toxic appearing  Cardiovascular: No visible lower extremity edema.  Respiratory: The patient does not have audible wheezing/stridor; respirations do not appear labored  Gastrointestinal: Abdomen non-distended Musculoskeletal: Normal ROM of UEs  Skin: No obvious rashes/open sores  Neurologic: CN 2-12 grossly intact Psychiatric: Answered questions appropriately with normal affect  Hematologic/Lymphatic/Immunologic: No obvious bruises or sites of spontaneous bleeding  UA: 6-10 WBC/hpf; otherwise unremarkable PVR: 1 ml  ASSESSMENT Hematuria, unspecified type  History of UTI - Plan: Urinalysis, Routine w reflex microscopic, BLADDER SCAN AMB NON-IMAGING  Kidney stones  Renal cyst; left  She is doing well with no acute concerns. No acute findings based on UA and recent imaging.   Advised adequate hydration for prevention of kidney stones and UTIs; handouts provided.   Will plan to follow up in 1 year with RUS for stone surveillance or sooner if needed. Pt  verbalized understanding and agreement. All questions were answered.  PLAN Advised the following: Return in about 1 year (around 06/08/2024) for RUS, UA, PVR, & f/u with Evette Georges NP.  Orders Placed This Encounter  Procedures   Urinalysis, Routine w reflex microscopic   BLADDER SCAN AMB NON-IMAGING    It has been explained that the patient is to follow regularly with their PCP in addition to all other providers involved in their care and to follow instructions provided by these respective offices. Patient advised to contact urology clinic if any urologic-pertaining questions, concerns, new symptoms or problems arise in the interim period.  Patient Instructions  >80% of stones are calcium oxalate. This type of stones forms when body either isn't clearing oxalate well enough, is making too much oxalate, or too little citrate. This results in oxalate binding to form crystals, which continue to aggregate and form stones.  Limiting calcium does not help, but limiting oxalate in the diet can help. Increasing citric acid intake may also help.  The following measures may help to prevent the recurrence of stones: Increase water intake to 2-2.5 liters per day May add citrus juice (lemon, lime or orange juice) to water Moderation in dairy foods Decrease in salt content 5. Low Oxalate diet: Oxylates are found in foods like Tomato, Spinach, red wine and chocolate (see additional resources below).  Internet resources for information regarding low oxalate diet: https://kidneystones.yangchunwu.com https://my.VerticalStretch.be  Foods Low in Sodium or Oxalate Foods You Can Eat  Drinks Coffee, fruit and veggie juice (using the recommended veggies), fruit punch  Fruits Apples, apricots (fresh or canned), avocado, bananas, cherries (sweet), cranberries, grapefruit, red or green grapes, lemon and lime juice,  melons, nectarines, papayas, peaches, pears, pineapples, oranges, strawberries (fresh), tangerines  Veggies Artichokes, asparagus, bamboo shoots, broccoli, brussels sprouts, cabbage, cauliflower, chayote squash, chicory, corn, cucumbers, endive, lettuce, lima beans, mushrooms, onions, peas, peppers, potatoes, radishes, rutabagas, zucchini  Breads, Cereals, Grains Egg noodles, rye bread, cooked and dry cereals without nuts or bran, crackers with unsalted tops,  white or wild rice  Meat, Systems developer, Fish, [employer], fish, poultry, eggs, egg whites, egg replacements  Soup Homemade soup (using the recommended veggies and meat), low-sodium bouillon, low-sodium canned  Desserts Cookies, cakes, ice cream, pudding without chocolate or nuts, candy without chocolate or nuts  Fats and Oils Butter, margarine, cream, oil, salad dressing, mayo  Other Foods Unsalted potato chips or pretzels, herbs (like garlic, garlic powder, onion powder), lemon juice, salt-free seasoning blends, vinegar  Other Foods Low in Oxalate Foods You Can Eat  Drinks Beer, cola, wine, buttermilk, lemonade or limeade (without added vitamin C), milk  Meat, Meat Replacements, Fish, [employer] meat, ham, bacon, hot dogs, bratwurst, sausage, chicken nuggets, cheddar cheese, canned fish and shellfish  Soup Tomato soup, cheese soup  Other Foods Coconuts, lemon or lime juices, sugar or sweeteners, jellies or jams (from the recommended list)   Moderate-Oxalate Foods Foods to Limit   Drinks Fruit and veggie juices (from the list below), chocolate milk, rice milk, hot cocoa, tea   Fruits Blackberries, blueberries, black currants, cherries (sour), fruit cocktail, mangoes, orange peel, prunes, purple plums   Veggies Baked beans, carrots, celery, green beans, parsnips, summer squash, tomatoes, turnips   Breads, Cereals, Grains White bread, cornbread or cornmeal, white English muffins, saltine or soda crackers, brown rice, vanilla wafers,  spaghetti and other noodles, firm tofu, bagels, oatmeal   Meat/meat replacements, fish, poultry Sardines   Desserts Chocolate cake   Fats and Oils Macadamia nuts, pistachio nuts, English walnuts   Other Foods Jams or jellies (made with the fruits above), pepper    High-Oxalate Foods Foods to Avoid Drinks Chocolate drink mixes, soy milk, Ovaltine, instant iced tea, fruit juices of fruits listed below Fruits Apricots (dried), red currants, figs, kiwi, plums, rhubarb Veggies Beans (wax, dried), beets and beet greens, chives, collard greens, eggplant, escarole, dark greens of all kinds, leeks, okra, parsley, rutabagas, spinach, Swiss chard, tomato paste, watercress Breads, Cereals, Grains Amaranth, barley, white corn flour, fried potatoes, fruitcake, grits, soybean products, sweet potatoes, wheat germ and bran, buckwheat flour, All Bran cereal, graham crackers, pretzels, whole wheat bread Meat/meat replacements, fish, poultry  Dried beans, peanut butter, soy burgers, miso  Desserts Carob, chocolate, marmalades Fats and Oils Nuts (peanuts, almonds, pecans, cashews, hazelnuts), nut butters, sesame seeds, tahini paste Other Foods Poppy seeds         Recommendations regarding UTI prevention / management:  When UTI symptoms occur: Call urology office to request order for urine culture. We recommend waiting for urine culture result prior to use of any antibiotics.  For bladder pain/ burning with urination: Over the counter Pyridium (phenazopyridine) as needed (commonly known under the "AZO" brand). No more than 3 days consecutively at a time due to risk for methemoglobinemia, liver function issues, and bone health damage with long term use of Pyridium.  Routine use for UTI prevention: Adequate fluid intake (>1.5 liters/day) to flush out the urinary tract. - Go to the bathroom to urinate every 4-6 hours while awake to minimize urinary stasis / bacterial overgrowth in the bladder. -  Proanthocyanidin (PAC) supplement 36 mg daily; must be soluble (insoluble form of PAC will be ineffective). Recommended brand: Ellura. This is an over-the-counter supplement (often must be found/ purchased online) supplement derived from cranberries with concentrated active component: Proanthocyanidin (PAC) 36 mg daily. Decreases bacterial adherence to bladder lining. Not recommended for patients with interstitial cystitis due to acidity. - D-mannose powder (2 grams daily). This is an over-the-counter supplement which  decreases bacterial adherence to bladder lining (it is a sugar that inhibits bacterial adherence to urothelial cells by binding to the pili of enteric bacteria). Take as per manufacturer recommendation. Can be used as an alternative or in addition to the concentrated cranberry supplement. Not recommended for diabetic patients due to sugar content. - Vitamin C supplement to acidify urine to minimize bacterial growth. Not recommended for patients with interstitial cystitis due to acidity. - Probiotic to maintain healthy vaginal microbiome to suppress bacteria at urethral opening. Brand recommendations: Darrold Junker (includes probiotic & D-mannose ), Feminine Balance (highest concentration of lactobacillus) or Hyperbiotic Pro 15.   Electronically signed by:  Donnita Falls, MSN, FNP-C, CUNP 06/09/2023 2:27 PM

## 2023-06-09 ENCOUNTER — Ambulatory Visit (INDEPENDENT_AMBULATORY_CARE_PROVIDER_SITE_OTHER): Payer: Medicare Other | Admitting: Urology

## 2023-06-09 ENCOUNTER — Encounter: Payer: Self-pay | Admitting: Urology

## 2023-06-09 VITALS — BP 142/79 | HR 80 | Temp 98.3°F

## 2023-06-09 DIAGNOSIS — N281 Cyst of kidney, acquired: Secondary | ICD-10-CM | POA: Diagnosis not present

## 2023-06-09 DIAGNOSIS — R319 Hematuria, unspecified: Secondary | ICD-10-CM | POA: Diagnosis not present

## 2023-06-09 DIAGNOSIS — Z87440 Personal history of urinary (tract) infections: Secondary | ICD-10-CM

## 2023-06-09 DIAGNOSIS — Z87442 Personal history of urinary calculi: Secondary | ICD-10-CM

## 2023-06-09 DIAGNOSIS — N200 Calculus of kidney: Secondary | ICD-10-CM

## 2023-06-09 LAB — BLADDER SCAN AMB NON-IMAGING: Scan Result: 1

## 2023-06-09 NOTE — Patient Instructions (Signed)
>80% of stones are calcium oxalate. This type of stones forms when body either isn't clearing oxalate well enough, is making too much oxalate, or too little citrate. This results in oxalate binding to form crystals, which continue to aggregate and form stones.  Limiting calcium does not help, but limiting oxalate in the diet can help. Increasing citric acid intake may also help.  The following measures may help to prevent the recurrence of stones: Increase water intake to 2-2.5 liters per day May add citrus juice (lemon, lime or orange juice) to water Moderation in dairy foods Decrease in salt content 5. Low Oxalate diet: Oxylates are found in foods like Tomato, Spinach, red wine and chocolate (see additional resources below).  Internet resources for information regarding low oxalate diet: https://kidneystones.yangchunwu.com https://my.VerticalStretch.be  Foods Low in Sodium or Oxalate Foods You Can Eat  Drinks Coffee, fruit and veggie juice (using the recommended veggies), fruit punch  Fruits Apples, apricots (fresh or canned), avocado, bananas, cherries (sweet), cranberries, grapefruit, red or green grapes, lemon and lime juice, melons, nectarines, papayas, peaches, pears, pineapples, oranges, strawberries (fresh), tangerines  Veggies Artichokes, asparagus, bamboo shoots, broccoli, brussels sprouts, cabbage, cauliflower, chayote squash, chicory, corn, cucumbers, endive, lettuce, lima beans, mushrooms, onions, peas, peppers, potatoes, radishes, rutabagas, zucchini  Breads, Cereals, Grains Egg noodles, rye bread, cooked and dry cereals without nuts or bran, crackers with unsalted tops, white or wild rice  Meat, Meat Replacements, Fish, Recruitment consultant, fish, poultry, eggs, egg whites, egg replacements  Soup Homemade soup (using the recommended veggies and meat), low-sodium bouillon, low-sodium canned   Desserts Cookies, cakes, ice cream, pudding without chocolate or nuts, candy without chocolate or nuts  Fats and Oils Butter, margarine, cream, oil, salad dressing, mayo  Other Foods Unsalted potato chips or pretzels, herbs (like garlic, garlic powder, onion powder), lemon juice, salt-free seasoning blends, vinegar  Other Foods Low in Oxalate Foods You Can Eat  Drinks Beer, cola, wine, buttermilk, lemonade or limeade (without added vitamin C), milk  Meat, Meat Replacements, Fish, [employer] meat, Schack, bacon, hot dogs, bratwurst, sausage, chicken nuggets, cheddar cheese, canned fish and shellfish  Soup Tomato soup, cheese soup  Other Foods Coconuts, lemon or lime juices, sugar or sweeteners, jellies or jams (from the recommended list)   Moderate-Oxalate Foods Foods to Limit   Drinks Fruit and veggie juices (from the list below), chocolate milk, rice milk, hot cocoa, tea   Fruits Blackberries, blueberries, black currants, cherries (sour), fruit cocktail, mangoes, orange peel, prunes, purple plums   Veggies Baked beans, carrots, celery, green beans, parsnips, summer squash, tomatoes, turnips   Breads, Cereals, Grains White bread, cornbread or cornmeal, white English muffins, saltine or soda crackers, brown rice, vanilla wafers, spaghetti and other noodles, firm tofu, bagels, oatmeal   Meat/meat replacements, fish, poultry Sardines   Desserts Chocolate cake   Fats and Oils Macadamia nuts, pistachio nuts, English walnuts   Other Foods Jams or jellies (made with the fruits above), pepper    High-Oxalate Foods Foods to Avoid Drinks Chocolate drink mixes, soy milk, Ovaltine, instant iced tea, fruit juices of fruits listed below Fruits Apricots (dried), red currants, figs, kiwi, plums, rhubarb Veggies Beans (wax, dried), beets and beet greens, chives, collard greens, eggplant, escarole, dark greens of all kinds, leeks, okra, parsley, rutabagas, spinach, Swiss chard, tomato paste,  watercress Breads, Cereals, Grains Amaranth, barley, white corn flour, fried potatoes, fruitcake, grits, soybean products, sweet potatoes, wheat germ and bran, buckwheat flour, All Bran cereal, graham crackers, pretzels, whole  wheat bread Meat/meat replacements, fish, poultry  Dried beans, peanut butter, soy burgers, miso  Desserts Carob, chocolate, marmalades Fats and Oils Nuts (peanuts, almonds, pecans, cashews, hazelnuts), nut butters, sesame seeds, tahini paste Other Foods Poppy seeds    ----------------------------------------------------------------------------------------------------------    Recommendations regarding UTI prevention / management:  When UTI symptoms occur: Call urology office to request order for urine culture. We recommend waiting for urine culture result prior to use of any antibiotics.  For bladder pain/ burning with urination: Over the counter Pyridium (phenazopyridine) as needed (commonly known under the "AZO" brand). No more than 3 days consecutively at a time due to risk for methemoglobinemia, liver function issues, and bone health damage with long term use of Pyridium.  Routine use for UTI prevention: Adequate fluid intake (>1.5 liters/day) to flush out the urinary tract. - Go to the bathroom to urinate every 4-6 hours while awake to minimize urinary stasis / bacterial overgrowth in the bladder. - Proanthocyanidin (PAC) supplement 36 mg daily; must be soluble (insoluble form of PAC will be ineffective). Recommended brand: Ellura. This is an over-the-counter supplement (often must be found/ purchased online) supplement derived from cranberries with concentrated active component: Proanthocyanidin (PAC) 36 mg daily. Decreases bacterial adherence to bladder lining. Not recommended for patients with interstitial cystitis due to acidity. - D-mannose powder (2 grams daily). This is an over-the-counter supplement which decreases bacterial adherence to bladder  lining (it is a sugar that inhibits bacterial adherence to urothelial cells by binding to the pili of enteric bacteria). Take as per manufacturer recommendation. Can be used as an alternative or in addition to the concentrated cranberry supplement. Not recommended for diabetic patients due to sugar content. - Vitamin C supplement to acidify urine to minimize bacterial growth. Not recommended for patients with interstitial cystitis due to acidity. - Probiotic to maintain healthy vaginal microbiome to suppress bacteria at urethral opening. Brand recommendations: Darrold Junker (includes probiotic & D-mannose ), Feminine Balance (highest concentration of lactobacillus) or Hyperbiotic Pro 15.

## 2023-06-09 NOTE — Progress Notes (Signed)
post void residual=1

## 2023-06-10 LAB — URINALYSIS, ROUTINE W REFLEX MICROSCOPIC
Bilirubin, UA: NEGATIVE
Glucose, UA: NEGATIVE
Ketones, UA: NEGATIVE
Nitrite, UA: NEGATIVE
Protein,UA: NEGATIVE
RBC, UA: NEGATIVE
Specific Gravity, UA: 1.025 (ref 1.005–1.030)
Urobilinogen, Ur: 0.2 mg/dL (ref 0.2–1.0)
pH, UA: 6 (ref 5.0–7.5)

## 2023-06-10 LAB — MICROSCOPIC EXAMINATION: Bacteria, UA: NONE SEEN

## 2023-08-18 ENCOUNTER — Other Ambulatory Visit: Payer: Self-pay | Admitting: Cardiology

## 2023-09-08 ENCOUNTER — Encounter: Payer: Self-pay | Admitting: Nurse Practitioner

## 2023-09-08 ENCOUNTER — Ambulatory Visit: Payer: Medicare Other | Attending: Nurse Practitioner | Admitting: Nurse Practitioner

## 2023-09-08 ENCOUNTER — Other Ambulatory Visit: Payer: Self-pay | Admitting: Cardiology

## 2023-09-08 VITALS — BP 110/64 | HR 92 | Ht 65.5 in | Wt 165.4 lb

## 2023-09-08 DIAGNOSIS — G8929 Other chronic pain: Secondary | ICD-10-CM

## 2023-09-08 DIAGNOSIS — I10 Essential (primary) hypertension: Secondary | ICD-10-CM

## 2023-09-08 DIAGNOSIS — R079 Chest pain, unspecified: Secondary | ICD-10-CM | POA: Diagnosis present

## 2023-09-08 DIAGNOSIS — I25119 Atherosclerotic heart disease of native coronary artery with unspecified angina pectoris: Secondary | ICD-10-CM

## 2023-09-08 DIAGNOSIS — I872 Venous insufficiency (chronic) (peripheral): Secondary | ICD-10-CM

## 2023-09-08 DIAGNOSIS — E785 Hyperlipidemia, unspecified: Secondary | ICD-10-CM | POA: Diagnosis present

## 2023-09-08 NOTE — Patient Instructions (Addendum)

## 2023-09-08 NOTE — Progress Notes (Signed)
 Cardiology Office Note:  .   Date:  09/08/2023 ID:  Kimberly Flowers, DOB 1940-10-10, MRN [id number] PCP: Kimberly Leta NOVAK, MD  [facility] Providers Cardiologist:  Kimberly Carrier, MD    History of Present Illness: .   Kimberly Flowers is a 82 y.o. female with PMH of CAD, s/p CABG, hx of chronic chest pain, hypertension, hyperlipidemia, and history of breast cancer, who presents today for preoperative cardiovascular risk assessment.  Previous CV history includes CABG, with subsequent occlusion of LIMA-LAD graft, history of receiving drug-eluting stent to LAD.  NST in 2018 low risk.  Underwent LHC in 2021-report noted below.  Had PCI to RCA CTO with Dr. Jordan in 2021.  Recommended for indefinite DAPT given her extensive stents.  PCI added to mid LAD in 2022.  Last seen by Dr. Carrier Kimberly on December 30, 2022.  She reported chronic stable angina overall stable, though reported be bothersome.  GDMT limited due to allergy to Ranexa , had baseline bradycardia, therefore BB avoided.  Reported some dizziness on Imdur .  Losartan  was stopped to see if she could tolerate Imdur  60 mg in a.m., 30 mg in p.m.  She called back to the office and it was decided to continue Imdur  at 60 mg daily, return to losartan  at 50 mg daily.  03/05/2023 - Presents for preoperative cardiovascular risk assessment.  She is pending left middle finger ganglion cyst excision, surgery set for March 10, 2023 with Dr. Norleen Flowers of [facility].  She presents today for medical clearance.  She is doing well today.  Continues to note chronic angina, stable per her report.  She describes it as intermittent, mid center chest pain, not severe per her report, occasionally takes an extra baby aspirin  and nitroglycerin  spray helps relieve the symptoms. Denies any shortness of breath, palpitations, syncope, presyncope, dizziness, orthopnea, PND, swelling or significant weight changes, acute bleeding, or claudication.  09/08/2023 -she  presents today for follow-up.  She continues to note chronic episodes of chest pain that have been stable since 2005 when she had her CABG surgery.  No specific pattern to her symptoms, episodes vary in length, no specific triggers.  Does take nitroglycerin  at times that helps.  Does have some palpitations that vary also, denies any long-lasting/bothersome palpitations.  Does admit to stress as her husband has dementia and also has caregiver. Denies any shortness of breath, syncope, presyncope, dizziness, orthopnea, PND, swelling or significant weight changes, acute bleeding, or claudication.   ROS: Negative. See HPI.   Studies Reviewed: Kimberly Flowers    EKG: Offered, strongly encouraged an EKG to be performed, pt refused.   Echo 06/2021: 1. LV function low normal to mildly reduced (EF 50).   2. Left ventricular ejection fraction, by estimation, is 45 to 50%. The  left ventricle has mildly decreased function. The left ventricle  demonstrates global hypokinesis. Left ventricular diastolic parameters are consistent with Grade I diastolic dysfunction (impaired relaxation). Elevated left atrial pressure.   3. Right ventricular systolic function is normal. The right ventricular  size is normal. Tricuspid regurgitation signal is inadequate for assessing PA pressure.   4. The mitral valve is normal in structure. No evidence of mitral valve  regurgitation. No evidence of mitral stenosis.   5. The aortic valve is tricuspid. Aortic valve regurgitation is trivial.  Mild aortic valve sclerosis is present, with no evidence of aortic valve  stenosis.   6. The inferior vena cava is normal in size with greater than 50%  respiratory variability, suggesting right atrial pressure of 3 mmHg.  LHC 10/18/2020, coronary stent intervention) Mid LAD lesion is 75% stenosed. Mid LAD to Dist LAD lesion is 35% stenosed. A drug-eluting stent was successfully placed using a STENT RESOLUTE ONYX 2.5X34. Post intervention, there is a  0% residual stenosis. Previously placed RPDA drug eluting stent is widely patent. Previously placed Mid RCA to Dist RCA drug eluting stent is widely patent. Previously placed Ost RCA to Mid RCA drug eluting stent is widely patent.   1. Widely patent RCA vessel from prior CTO PCI 2. Successful PCI of the proximal to mid LAD with OCT guidance and DES x 1 with 2.5 x 34 mm Resolute stent post dilated with 2.75 mm [place] balloon to 18 atm   Plan: will observe overnight tonight. Continue DAPT indefinitely. Anticipate DC in am.  Coronary stent intervention 08/2020: 3rd Diag-1 lesion is 90% stenosed. 3rd Diag-2 lesion is 70% stenosed. Mid LAD lesion is 65% stenosed. Mid LAD to Dist LAD lesion is 25% stenosed. 2nd Diag lesion is 45% stenosed. Mid RCA to Dist RCA lesion is 100% stenosed. A drug-eluting stent was successfully placed using a SYNERGY XD 3.50X38. Ost RCA to Mid RCA lesion is 80% stenosed. A drug-eluting stent was successfully placed using a SYNERGY XD 4.0X48. RPDA lesion is 90% stenosed. A drug-eluting stent was successfully placed using a SYNERGY XD 2.50X28. A drug-eluting stent was successfully placed using a SYNERGY XD 4.0X12. Post intervention, there is a 0% residual stenosis. Post intervention, there is a 0% residual stenosis. Post intervention, there is a 0% residual stenosis.   1. Successful CTO PCI of the RCA from the proximal vessel into the PDA with DES x 4 and IVUS guidance.   Plan: DAPT indefinitely given extensive nature of stents. We will see how she does clinically. If she has persistent anginal symptoms we could address the stenosis in the LAD at a later time.   LHC 07/2020: Severe two-vessel coronary artery disease involving the dominant right coronary and the previously stented mid to distal LAD which involves to moderate to large diagonals. Left main is widely patent LAD contains segmental 60 to 70% mid stenosis proximal to a previously placed stent in the distal  segment.  The stent jails a large and moderate sized second and third diagonals.  The third diagonal has proximal and mid significant stenoses.  LAD supplies collaterals to the right coronary via septal perforators and around the apex. Circumflex gives origin to 2 obtuse marginal branches which are free of any significant obstruction.  The proximal circumflex contains eccentric 20% stenosis. Ramus intermedius is large and widely patent Right coronary is dominant and totally occluded in the mid to distal segment and receives collaterals from right to right and left to right. Normal LV function.  LVEDP 8 mmHg.  EF 55%.   RECOMMENDATIONS:   We will discuss whether RCA is approachable by the CTO team. Would up titrate nitrate therapy to improve angina. If refractory symptoms, consider repeat coronary bypass to the second and third diagonals as well as the distal LAD.  Would also need to have LV branch of RCA and PDA grafted at the same time.  If only interventional approach is stenting of the mid LAD this would lead to a double layer of stent and will have limited long-term patency.   LHC 08/2015:  Prox RCA lesion, 20% stenosed. Ost Cx to Prox Cx lesion, 20% stenosed. 3rd Diag lesion, 90% stenosed. Mid LAD to [facility] LAD  lesion, 20% stenosed. The lesion was previously treated with a stent (unknown type) greater than two years ago. Ost 3rd Diag to 3rd Diag lesion, 40% stenosed. Ost 2nd Diag lesion, 60% stenosed. Prox LAD to Mid LAD lesion, 40% stenosed. The left ventricular systolic function is normal. LIMA was not injected . Origin lesion, 100% stenosed.   1. Single vessel CAD with patent stent mid LAD with mild restenosis. 2. Severe disease in a small caliber diagonal branch. The ostium of this branch is jailed by the LAD stent. The vessel is 1.5 mm and too small for PCI.  3. Mild non-obstructive disease in the RCA and Circumflex artery.  4. Normal LV systolic function 5. Known occlusion of the  LIMA graft to the LAD (non injected)   Recommendations: Continue medical management of CAD.       Physical Exam:   VS:  BP 110/64   Pulse 92   Ht 5' 5.5 (1.664 m)   Wt 165 lb 6.4 oz (75 kg)   SpO2 96%   BMI 27.11 kg/m    Wt Readings from Last 3 Encounters:  09/08/23 165 lb 6.4 oz (75 kg)  03/05/23 162 lb 9.6 oz (73.8 kg)  12/30/22 165 lb (74.8 kg)    GEN: Well nourished, well developed in no acute distress NECK: No JVD; No carotid bruits CARDIAC: S1/S2, RRR, no murmurs, rubs, gallops RESPIRATORY:  Clear to auscultation without rales, wheezing or rhonchi  ABDOMEN: Soft, non-tender, non-distended EXTREMITIES: Nonpitting edema to bilateral lower extremities, (L>R); No deformity   ASSESSMENT AND PLAN: .    CAD, s/p CABG, chronic chest pain  History of longstanding chronic angina since 2005, stable per her report.  Medical therapy limited - reported allergy to Ranexa , has hx baseline bradycardia, therefore BB avoided.  Reported some dizziness on Imdur  in the past.  Tolerating current medication regimen well.  Offered/strongly recommended to perform an EKG, however patient declines as she states she must get home to help with her husband.  Caregiver called during appointment today saying patient's husband needed help at home. No medication changes at this time. Heart healthy diet and regular cardiovascular exercise encouraged.  ED precautions discussed.  HTN Blood pressure stable.  Continue current medication regimen. Discussed to monitor BP at home at least 2 hours after medications and sitting for 5-10 minutes. Heart healthy diet and regular cardiovascular exercise encouraged.   HLD Past LDL 63.  LDL goal less than 55.  Continue medication regimen at this time.  Discussed lifestyle modifications, including diet and exercise.  She verbalized understanding.  Not addressed, but at next office visit, plan to repeat FLP and LFT and if no improvement, plan to initiate Zetia  to medication  regimen.  Chronic venous insufficiency  Stable leg edema per her report.  Discussed low-salt, heart healthy diet and leg elevation.  Previously recommended wearing compression stockings. Heart healthy diet and regular cardiovascular exercise encouraged.   Dispo: Follow-up with Dr. Dorn Flowers or APP in 6 months or sooner anything changes.  Signed, Almarie Crate, NP

## 2023-09-09 ENCOUNTER — Other Ambulatory Visit: Payer: Self-pay | Admitting: Cardiology

## 2023-09-14 ENCOUNTER — Other Ambulatory Visit: Payer: Self-pay | Admitting: Nurse Practitioner

## 2023-09-14 DIAGNOSIS — I2510 Atherosclerotic heart disease of native coronary artery without angina pectoris: Secondary | ICD-10-CM

## 2023-09-14 DIAGNOSIS — I25119 Atherosclerotic heart disease of native coronary artery with unspecified angina pectoris: Secondary | ICD-10-CM

## 2023-09-14 DIAGNOSIS — E782 Mixed hyperlipidemia: Secondary | ICD-10-CM

## 2023-09-14 MED ORDER — EZETIMIBE 10 MG PO TABS: 10.0000 mg | ORAL_TABLET | Freq: Every day | ORAL | 1 refills | Status: DC

## 2023-12-25 ENCOUNTER — Other Ambulatory Visit: Payer: Self-pay | Admitting: Cardiology

## 2024-01-30 ENCOUNTER — Other Ambulatory Visit: Payer: Self-pay | Admitting: Cardiology

## 2024-02-04 ENCOUNTER — Other Ambulatory Visit: Payer: Self-pay | Admitting: Cardiology

## 2024-02-28 ENCOUNTER — Other Ambulatory Visit: Payer: Self-pay | Admitting: Cardiology

## 2024-03-05 ENCOUNTER — Other Ambulatory Visit: Payer: Self-pay | Admitting: Nurse Practitioner

## 2024-03-07 ENCOUNTER — Telehealth: Payer: Self-pay | Admitting: Cardiology

## 2024-03-07 NOTE — Telephone Encounter (Signed)
*  STAT* If patient is at the pharmacy, call can be transferred to refill team.   1. Which medications need to be refilled? (please list name of each medication and dose if known)   ezetimibe  (ZETIA ) 10 MG tablet (Expired)   2. Would you like to learn more about the convenience, safety, & potential cost savings by using the [facility]?   3. Are you open to using the [facility] (Type [facility]. .  4. Which pharmacy/location (including street and city if local pharmacy) is medication to be sent to?  [id number] - [place], [place] - [address]   5. Do they need a 30 day or 90 day supply?   Patient stated she has about 6 tablets left.

## 2024-03-25 ENCOUNTER — Other Ambulatory Visit (HOSPITAL_COMMUNITY): Payer: Self-pay

## 2024-03-25 ENCOUNTER — Other Ambulatory Visit: Payer: Self-pay

## 2024-03-25 MED ORDER — EZETIMIBE 10 MG PO TABS
10.0000 mg | ORAL_TABLET | Freq: Every day | ORAL | 1 refills | Status: DC
  Filled 2024-03-25: qty 90, 90d supply, fill #0

## 2024-03-25 MED ORDER — EZETIMIBE 10 MG PO TABS: 10.0000 mg | ORAL_TABLET | Freq: Every day | ORAL | 1 refills | Status: DC

## 2024-03-25 NOTE — Telephone Encounter (Signed)
 Pt's medication was resent to pt's preferred pharmacy. Confirmation received.

## 2024-03-25 NOTE — Telephone Encounter (Signed)
 Pt out of medication

## 2024-04-13 ENCOUNTER — Other Ambulatory Visit: Payer: Self-pay | Admitting: Cardiology

## 2024-04-29 ENCOUNTER — Ambulatory Visit: Attending: Cardiology | Admitting: Cardiology

## 2024-04-29 ENCOUNTER — Encounter: Payer: Self-pay | Admitting: Cardiology

## 2024-04-29 ENCOUNTER — Other Ambulatory Visit (HOSPITAL_BASED_OUTPATIENT_CLINIC_OR_DEPARTMENT_OTHER): Payer: Self-pay

## 2024-04-29 VITALS — BP 130/78 | HR 76 | Ht 65.0 in | Wt 175.0 lb

## 2024-04-29 DIAGNOSIS — I25118 Atherosclerotic heart disease of native coronary artery with other forms of angina pectoris: Secondary | ICD-10-CM | POA: Insufficient documentation

## 2024-04-29 DIAGNOSIS — I10 Essential (primary) hypertension: Secondary | ICD-10-CM | POA: Insufficient documentation

## 2024-04-29 DIAGNOSIS — E782 Mixed hyperlipidemia: Secondary | ICD-10-CM | POA: Diagnosis present

## 2024-04-29 MED ORDER — FUROSEMIDE 20 MG PO TABS
20.0000 mg | ORAL_TABLET | Freq: Every day | ORAL | 2 refills | Status: DC | PRN
Start: 2024-04-29 — End: 2024-04-29

## 2024-04-29 MED ORDER — FUROSEMIDE 20 MG PO TABS
20.0000 mg | ORAL_TABLET | Freq: Every day | ORAL | 2 refills | Status: DC | PRN
  Filled 2024-04-29: qty 30, 30d supply, fill #0

## 2024-04-29 MED ORDER — ISOSORBIDE MONONITRATE ER 60 MG PO TB24
60.0000 mg | ORAL_TABLET | Freq: Every day | ORAL | 1 refills | Status: DC
  Filled 2024-04-29: qty 30, 30d supply, fill #0

## 2024-04-29 NOTE — Addendum Note (Signed)
 Addended by: JOHNNYE LITTIE HERO on: 04/29/2024 11:38 AM   Modules accepted: Orders

## 2024-04-29 NOTE — Patient Instructions (Addendum)
 Medication Instructions:  Your physician has recommended you make the following change in your medication:  Start taking Lasix  20 mg as needed for leg swelling Continue taking all other medications as prescribed   Labwork: None  Testing/Procedures: None  Follow-Up: Your physician recommends that you schedule a follow-up appointment in: 6 months  Any Other Special Instructions Will Be Listed Below (If Applicable). Thank you for choosing [facility]!     If you need a refill on your cardiac medications before your next appointment, please call your pharmacy.

## 2024-04-29 NOTE — Progress Notes (Signed)
 Clinical Summary Kimberly Flowers is a 83 y.o.female seen today for follow up of the following medical problems.      1. CAD - history of prior CABG, subsequent occlusion of LIMA-LAD graft, had LAD stent  Low risk nuclear stress test on 09/15/2017   07/2020 cath: mid LAD 65%, D3 90%, LCX patent, RCA occluded mid RPDA fills by collaterals.  -she had PCI to RCA CTO with Dr [name] 08/15/20. Recs for indefintie DAPT given extensive stents.  - 10/2020 PCI to mid LAD   -chronic chest pains even after her coronary interventions, though less intense.    -ranexa  listed as allergy, caused dizziness - 06/2021 echo LVEF 45-50%         - some recent chest pains. On average 2-3 times per week. Can occur at rest or with activity. About 6/10 in severity, typically improves with NG.  - stable frequency, stable severity     - last visit increased imdur  to 90mg  daily. Due to prior diziness on this dose we lowered her losartan  to 25mg  daily as well - increased imdur  90mg  but appears did not lower her losartan  dose - chronic chest pains unchanged  Chronic stable symptoms. Abouts 3-4 episodes, rest or with activity. Lasts about 10 minutes, better with NG - compliant with meds     2. HTN - she is compliant with meds    3. Breast cancer     4. Hyperlipidemia  10/2020 TC 150 TG 113 HDL 51 LDL 79. At this time atorvastatin  was increased to 80mg  daily, repeat labs with pcp 02/2022 TC 139 TG 95 HDL 49 LDL 72 -has repeat labs in June of this year with pcp \- labs with pcp next month Past Medical History:  Diagnosis Date   Anemia    Coronary atherosclerosis of native coronary artery    Previous PCI 2007-2008, Dr. Hildreth   Essential hypertension, benign    History of kidney stones    Mixed hyperlipidemia    PAD (peripheral artery disease) (HCC)     Absent right DP   Palpitations    Type 2 diabetes mellitus (HCC)    Vasomotor rhinitis      Allergies  Allergen Reactions   Ranolazine  Er  Other (See Comments)    Dizziness    Sulfonamide Derivatives     Unknown     Current Outpatient Medications  Medication Sig Dispense Refill   acetaminophen  (TYLENOL ) 500 MG tablet Take 500-1,000 mg by mouth every 6 (six) hours as needed for moderate pain.     amLODipine  (NORVASC ) 10 MG tablet TAKE 1 TABLET BY MOUTH EVERY DAY 90 tablet 1   anastrozole (ARIMIDEX) 1 MG tablet Take 1 mg by mouth daily.     Ascorbic Acid  (VITAMIN C  WITH ROSE HIPS) 500 MG tablet Take 1,000 mg by mouth daily.     aspirin  81 MG EC tablet Take 81 mg by mouth daily.     atorvastatin  (LIPITOR ) 80 MG tablet TAKE 1 TABLET BY MOUTH EVERY DAY 90 tablet 1   Calcium  Carb-Cholecalciferol  (CALCIUM  PLUS VITAMIN D3) 600-500 MG-UNIT CAPS Take 1 tablet by mouth daily.     cholecalciferol  (VITAMIN D3) 25 MCG (1000 UNIT) tablet Take 1,000 Units by mouth daily.     clopidogrel  (PLAVIX ) 75 MG tablet TAKE 1 TABLET BY MOUTH EVERY DAY 90 tablet 3   Cyanocobalamin (VITAMIN B-12 PO) Take 1 tablet by mouth daily.     docusate sodium  (COLACE) 100 MG capsule Take  100 mg by mouth daily as needed for mild constipation or moderate constipation.      eye wash (,SODIUM/POTASSIUM/SOD CHLORIDE,) SOLN Place 1 drop into both eyes 3 (three) times a week.     ezetimibe  (ZETIA ) 10 MG tablet Take 1 tablet (10 mg total) by mouth daily. 90 tablet 1   ferrous gluconate  (FERGON) 324 MG tablet Take 324 mg by mouth daily with breakfast.     fexofenadine (ALLEGRA) 180 MG tablet Take 180 mg by mouth daily.     Glucosamine-Chondroitin-MSM-D3 TABS Take 1 tablet by mouth daily.     isosorbide  mononitrate (IMDUR ) 60 MG 24 hr tablet TAKE 1 TABLET (60 MG TOTAL) BY MOUTH EVERY MORNING AND 0.5 TABLETS (30 MG TOTAL) EVERY EVENING. 135 tablet 0   losartan  (COZAAR ) 50 MG tablet Take 1 tablet (50 mg total) by mouth daily.     metFORMIN  (GLUCOPHAGE ) 500 MG tablet Take 500 mg by mouth daily with breakfast.     Multiple Vitamin (MULTIVITAMIN WITH MINERALS) TABS tablet Take 1  tablet by mouth daily.     Multiple Vitamins-Minerals (PRESERVISION AREDS 2 PO) Take 1 tablet by mouth in the morning and at bedtime. EYE VITAMIN     nebivolol  (BYSTOLIC ) 10 MG tablet Take 1 tablet (10 mg total) by mouth daily. 30 tablet 6   nitroGLYCERIN  (NITROLINGUAL ) 0.4 MG/SPRAY spray PLACE 1 SPRAY UNDER THE TONGUE EVERY 5 (FIVE) MINUTES X 3 DOSES AS NEEDED FOR CHEST PAIN (IF NO RELIEF AFTER 3RD DOSE, PROCEED TO THE ED FOR AN EVALUATION). 75 g 3   Omega-3 Fatty Acids (FISH OIL PO) Take 1 capsule by mouth daily.     pantoprazole  (PROTONIX ) 40 MG tablet TAKE 1 TABLET BY MOUTH EVERY DAY 90 tablet 3   No current facility-administered medications for this visit.     Past Surgical History:  Procedure Laterality Date   ABDOMINAL HYSTERECTOMY     APPENDECTOMY     BREAST LUMPECTOMY WITH RADIOACTIVE SEED LOCALIZATION Right 04/16/2021   Procedure: RIGHT BREAST LUMPECTOMY WITH RADIOACTIVE SEED LOCALIZATION;  Surgeon: Aron Shoulders, MD;  Location: [facility];  Service: General;  Laterality: Right;   BREAST SURGERY     CARDIAC CATHETERIZATION N/A 08/17/2015   Procedure: Left Heart Cath and Coronary Angiography;  Surgeon: Lonni JONETTA Cash, MD;  Location: [facility] INVASIVE CV LAB;  Service: Cardiovascular;  Laterality: N/A;   CHOLECYSTECTOMY     CORONARY ARTERY BYPASS GRAFT  2006   [name], single vessel   CORONARY CTO INTERVENTION N/A 08/15/2020   Procedure: CORONARY CTO INTERVENTION;  Surgeon: [name], [name], MD;  Location: [facility] INVASIVE CV LAB;  Service: Cardiovascular;  Laterality: N/A;   CORONARY STENT INTERVENTION N/A 08/15/2020   Procedure: CORONARY STENT INTERVENTION;  Surgeon: [name], [name], MD;  Location: [facility] INVASIVE CV LAB;  Service: Cardiovascular;  Laterality: N/A;   CORONARY STENT INTERVENTION N/A 10/18/2020   Procedure: CORONARY STENT INTERVENTION;  Surgeon: [name], [name], MD;  Location: [facility] INVASIVE CV LAB;  Service: Cardiovascular;  Laterality: N/A;   CORONARY STENT INTERVENTION   10/18/2020   CORONARY ULTRASOUND/IVUS N/A 08/15/2020   Procedure: Intravascular Ultrasound/IVUS;  Surgeon: [name], [name], MD;  Location: [facility] INVASIVE CV LAB;  Service: Cardiovascular;  Laterality: N/A;   CORONARY ULTRASOUND/IVUS N/A 10/18/2020   Procedure: Intravascular Ultrasound/IVUS;  Surgeon: [name], [name], MD;  Location: [facility] INVASIVE CV LAB;  Service: Cardiovascular;  Laterality: N/A;   CYST EXCISION Left 03/09/2023   Middle finger; left hand   CYSTOSCOPY W/ URETERAL STENT  PLACEMENT Left 06/20/2021   Procedure: CYSTOSCOPY WITH RETROGRADE PYELOGRAM/URETERAL STENT PLACEMENT;  Surgeon: Gaston Hamilton, MD;  Location: [facility];  Service: Urology;  Laterality: Left;   CYSTOSCOPY WITH RETROGRADE PYELOGRAM, URETEROSCOPY AND STENT PLACEMENT Bilateral 07/05/2021   Procedure: CYSTOSCOPY WITH BILATERAL RETROGRADE PYELOGRAM, LEFT URETEROSCOPY AND LEFT STENT EXCHANGE;  Surgeon: Roseann Adine PARAS., MD;  Location: [facility];  Service: Urology;  Laterality: Bilateral;   HOLMIUM LASER APPLICATION Left 07/05/2021   Procedure: HOLMIUM LASER APPLICATION;  Surgeon: Roseann Adine PARAS., MD;  Location: [facility];  Service: Urology;  Laterality: Left;   LEFT HEART CATH AND CORONARY ANGIOGRAPHY N/A 07/10/2020   Procedure: LEFT HEART CATH AND CORONARY ANGIOGRAPHY;  Surgeon: Claudene Victory ORN, MD;  Location: [facility] INVASIVE CV LAB;  Service: Cardiovascular;  Laterality: N/A;   STONE EXTRACTION WITH BASKET Left 07/05/2021   Procedure: STONE EXTRACTION WITH BASKET;  Surgeon: Roseann Adine PARAS., MD;  Location: [facility];  Service: Urology;  Laterality: Left;     Allergies  Allergen Reactions   Ranolazine  Er Other (See Comments)    Dizziness    Sulfonamide Derivatives     Unknown      Family History  Problem Relation Age of Onset   Cancer Sister        Breast   CAD Other        Family history     Social History Ms. Mcdowell reports that she has never smoked. She has never been exposed to tobacco smoke. She has  never used smokeless tobacco. Ms. Yonan reports no history of alcohol  use.    Physical Examination Today's Vitals   04/29/24 1037  BP: 130/78  Pulse: 76  SpO2: 97%  Weight: 175 lb (79.4 kg)  Height: 5' 5 (1.651 m)   Body mass index is 29.12 kg/m.  Gen: resting comfortably, no acute distress HEENT: no scleral icterus, pupils equal round and reactive, no palptable cervical adenopathy,  CV: RRR, no m/rg no jvd Resp: Clear to auscultation bilaterally GI: abdomen is soft, non-tender, non-distended, normal bowel sounds, no hepatosplenomegaly MSK: extremities are warm, no edema.  Skin: warm, no rash Neuro:  no focal deficits Psych: appropriate affect   Diagnostic Studies She underwent coronary angiography on 08/17/2015 which demonstrated the following:   1. Single vessel CAD with patent stent mid LAD with mild restenosis. 2. Severe disease in a small caliber diagonal Kassity Woodson. The ostium of this Jacey Pelc is jailed by the LAD stent. The vessel is 1.5 mm and too small for PCI.   3. Mild non-obstructive disease in the RCA and Circumflex artery.   4. Normal LV systolic function 5. Known occlusion of the LIMA graft to the LAD (non injected)   Echocardiogram on 08/17/15 showed normal left ventricular systolic function and regional wall motion, EF 55-60%, grade 1 diastolic dysfunction, and aortic valve sclerosis with trivial regurgitation.   Nuclear stress test 09/15/17 showed no significant myocardial ischemia or scar.  Blood pressure demonstrated a hypertensive response to exercise.  Duke treadmill score was low, LVEF 58%.   06/2021 echo IMPRESSIONS     1. LV function low normal to mildly reduced (EF 50).   2. Left ventricular ejection fraction, by estimation, is 45 to 50%. The  left ventricle has mildly decreased function. The left ventricle  demonstrates global hypokinesis. Left ventricular diastolic parameters are  consistent with Grade I diastolic  dysfunction (impaired  relaxation). Elevated left atrial pressure.   3. Right ventricular systolic function is normal. The right ventricular  size  is normal. Tricuspid regurgitation signal is inadequate for assessing  PA pressure.   4. The mitral valve is normal in structure. No evidence of mitral valve  regurgitation. No evidence of mitral stenosis.   5. The aortic valve is tricuspid. Aortic valve regurgitation is trivial.  Mild aortic valve sclerosis is present, with no evidence of aortic valve  stenosis.   6. The inferior vena cava is normal in size with greater than 50%  respiratory variability, suggesting right atrial pressure of 3 mmHg.     Assessment and Plan   1. CAD with chronic stable angina - after her PCI procedurs reports chest pain symptoms were less frequent but never fully went away - chronic stable angina overall stable though still bothersome - on max dose norvasc . HRs 50-60s would not titrate beta blocker. Allergy to ranexa . Some dizziness on imdur ,has not tolerated higher doses - symptoms stable and not limiting, continue current therapy.  - EKG SR, PACs, no ischemic changes   2. HTN -at goal, continue current meds  3. Hyperlipidemia -upcoming labs with pcp, continue current meds  F/u 6 months     Dorn PHEBE Ross, M.D.

## 2024-05-03 ENCOUNTER — Telehealth: Payer: Self-pay | Admitting: Cardiology

## 2024-05-03 NOTE — Telephone Encounter (Signed)
 Pt said the pharmacy is asking if she needs to be on both Pantoprazole  and Atorvastatin  due to side effects  [phone]

## 2024-05-04 NOTE — Telephone Encounter (Signed)
 PT called asking for an update. Did inform her Dr.Branch is not working in our clinic today!

## 2024-05-05 ENCOUNTER — Telehealth: Payer: Self-pay | Admitting: Cardiology

## 2024-05-05 NOTE — Telephone Encounter (Signed)
 Pt c/o medication issue:  1. Name of Medication:   pantoprazole  (PROTONIX ) 40 MG tablet  clopidogrel  (PLAVIX ) 75 MG tablet   2. How are you currently taking this medication (dosage and times per day)? na  3. Are you having a reaction (difficulty breathing--STAT)? Na  4. What is your medication issue? [facility] [place] called and stated there is a mild medication interaction with the above 2 meds   [phone]

## 2024-05-05 NOTE — Telephone Encounter (Signed)
 Patient informed and verbalized understanding of plan. Advised to have [facility] contact us  with interactions or issues relating to cardiac medications.

## 2024-05-10 NOTE — Telephone Encounter (Signed)
 Pharmacy is aware. They stated they just wanted to make sure we were aware patient is taking these 2 medication and has not had any issues.

## 2024-06-08 ENCOUNTER — Ambulatory Visit: Payer: Medicare Other | Admitting: Urology

## 2024-07-22 ENCOUNTER — Other Ambulatory Visit: Payer: Self-pay | Admitting: Cardiology

## 2024-08-04 ENCOUNTER — Other Ambulatory Visit: Payer: Self-pay | Admitting: Cardiology

## 2024-08-08 NOTE — Progress Notes (Unsigned)
 Chief Complaint: No chief complaint on file.   History of Present Illness:  Kimberly Flowers is a 83 y.o. female who is seen in consultation from Kimberly Leta NOVAK, MD for evaluation of ***.   Past Medical History:  Past Medical History:  Diagnosis Date   Anemia    Coronary atherosclerosis of native coronary artery    Previous PCI 2007-2008, Dr. Hildreth   Essential hypertension, benign    History of kidney stones    Mixed hyperlipidemia    PAD (peripheral artery disease)     Absent right DP   Palpitations    Type 2 diabetes mellitus (HCC)    Vasomotor rhinitis     Past Surgical History:  Past Surgical History:  Procedure Laterality Date   ABDOMINAL HYSTERECTOMY     APPENDECTOMY     BREAST LUMPECTOMY WITH RADIOACTIVE SEED LOCALIZATION Right 04/16/2021   Procedure: RIGHT BREAST LUMPECTOMY WITH RADIOACTIVE SEED LOCALIZATION;  Surgeon: Kimberly Shoulders, MD;  Location: [facility] OR;  Service: General;  Laterality: Right;   BREAST SURGERY     CARDIAC CATHETERIZATION N/A 08/17/2015   Procedure: Left Heart Cath and Coronary Angiography;  Surgeon: Kimberly JONETTA Cash, MD;  Location: [facility] INVASIVE CV LAB;  Service: Cardiovascular;  Laterality: N/A;   CHOLECYSTECTOMY     CORONARY ARTERY BYPASS GRAFT  2006   [name], single vessel   CORONARY CTO INTERVENTION N/A 08/15/2020   Procedure: CORONARY CTO INTERVENTION;  Surgeon: Jordan, [name], MD;  Location: [facility] INVASIVE CV LAB;  Service: Cardiovascular;  Laterality: N/A;   CORONARY STENT INTERVENTION N/A 08/15/2020   Procedure: CORONARY STENT INTERVENTION;  Surgeon: Jordan, [name], MD;  Location: [facility] INVASIVE CV LAB;  Service: Cardiovascular;  Laterality: N/A;   CORONARY STENT INTERVENTION N/A 10/18/2020   Procedure: CORONARY STENT INTERVENTION;  Surgeon: Jordan, [name], MD;  Location: [facility] INVASIVE CV LAB;  Service: Cardiovascular;  Laterality: N/A;   CORONARY STENT INTERVENTION  10/18/2020   CORONARY ULTRASOUND/IVUS N/A 08/15/2020   Procedure:  Intravascular Ultrasound/IVUS;  Surgeon: Jordan, [name], MD;  Location: [facility] INVASIVE CV LAB;  Service: Cardiovascular;  Laterality: N/A;   CORONARY ULTRASOUND/IVUS N/A 10/18/2020   Procedure: Intravascular Ultrasound/IVUS;  Surgeon: Jordan, [name], MD;  Location: [facility] INVASIVE CV LAB;  Service: Cardiovascular;  Laterality: N/A;   CYST EXCISION Left 03/09/2023   Middle finger; left hand   CYSTOSCOPY W/ URETERAL STENT PLACEMENT Left 06/20/2021   Procedure: CYSTOSCOPY WITH RETROGRADE PYELOGRAM/URETERAL STENT PLACEMENT;  Surgeon: Kimberly Hamilton, MD;  Location: [facility];  Service: Urology;  Laterality: Left;   CYSTOSCOPY WITH RETROGRADE PYELOGRAM, URETEROSCOPY AND STENT PLACEMENT Bilateral 07/05/2021   Procedure: CYSTOSCOPY WITH BILATERAL RETROGRADE PYELOGRAM, LEFT URETEROSCOPY AND LEFT STENT EXCHANGE;  Surgeon: Kimberly Adine PARAS., MD;  Location: [facility];  Service: Urology;  Laterality: Bilateral;   HOLMIUM LASER APPLICATION Left 07/05/2021   Procedure: HOLMIUM LASER APPLICATION;  Surgeon: Kimberly Adine PARAS., MD;  Location: [facility];  Service: Urology;  Laterality: Left;   LEFT HEART CATH AND CORONARY ANGIOGRAPHY N/A 07/10/2020   Procedure: LEFT HEART CATH AND CORONARY ANGIOGRAPHY;  Surgeon: Kimberly Victory ORN, MD;  Location: [facility] INVASIVE CV LAB;  Service: Cardiovascular;  Laterality: N/A;   STONE EXTRACTION WITH BASKET Left 07/05/2021   Procedure: STONE EXTRACTION WITH BASKET;  Surgeon: Kimberly Adine PARAS., MD;  Location: [facility];  Service: Urology;  Laterality: Left;    Allergies:  Allergies  Allergen Reactions   Ranolazine  Er Other (See Comments)    Dizziness  Sulfonamide Derivatives     Unknown    Family History:  Family History  Problem Relation Age of Onset   Cancer Sister        Breast   CAD Other        Family history    Social History:  Social History   Tobacco Use   Smoking status: Never    Passive exposure: Never   Smokeless tobacco: Never  Vaping Use   Vaping  status: Never Used  Substance Use Topics   Alcohol  use: No    Alcohol /week: 0.0 standard drinks of alcohol    Drug use: No    Review of symptoms:  Constitutional:  Negative for unexplained weight loss, night sweats, fever, chills ENT:  Negative for nose bleeds, sinus pain, painful swallowing CV:  Negative for chest pain, shortness of breath, exercise intolerance, palpitations, loss of consciousness Resp:  Negative for cough, wheezing, shortness of breath GI:  Negative for nausea, vomiting, diarrhea, bloody stools GU:  Positives noted in HPI; otherwise negative for gross hematuria, dysuria, urinary incontinence Neuro:  Negative for seizures, poor balance, limb weakness, slurred speech Psych:  Negative for lack of energy, depression, anxiety Endocrine:  Negative for polydipsia, polyuria, symptoms of hypoglycemia (dizziness, hunger, sweating) Hematologic:  Negative for anemia, purpura, petechia, prolonged or excessive bleeding, use of anticoagulants  Allergic:  Negative for difficulty breathing or choking as a result of exposure to anything; no shellfish allergy; no allergic response (rash/itch) to materials, foods  Physical exam: There were no vitals taken for this visit. GENERAL APPEARANCE:  Well appearing, well developed, well nourished, NAD HEENT: Atraumatic, Normocephalic, oropharynx clear. NECK: Supple without lymphadenopathy or thyromegaly. LUNGS: Clear to auscultation bilaterally. HEART: Regular Rate and Rhythm without murmurs, gallops, or rubs. ABDOMEN: Soft, non-tender, No Masses. EXTREMITIES: Moves all extremities well.  Without clubbing, cyanosis, or edema. NEUROLOGIC:  Alert and oriented x 3, normal gait, CN II-XII grossly intact.  MENTAL STATUS:  Appropriate. BACK:  Non-tender to palpation.  No CVAT SKIN:  Warm, dry and intact.    Results: No results found for this or any previous visit (from the past 24 hours).  I have reviewed prior patient's records  I have  reviewed referring/prior physicians records  I have reviewed urinalysis  I have reviewed prior urine cultures  I reviewed prior imaging studies  Assessment:   Plan: ***

## 2024-08-09 ENCOUNTER — Ambulatory Visit: Admitting: Urology

## 2024-08-09 VITALS — BP 128/79 | HR 92

## 2024-08-09 DIAGNOSIS — R8281 Pyuria: Secondary | ICD-10-CM

## 2024-08-09 DIAGNOSIS — N8111 Cystocele, midline: Secondary | ICD-10-CM

## 2024-08-09 DIAGNOSIS — R339 Retention of urine, unspecified: Secondary | ICD-10-CM

## 2024-08-09 DIAGNOSIS — N200 Calculus of kidney: Secondary | ICD-10-CM

## 2024-08-09 DIAGNOSIS — N8110 Cystocele, unspecified: Secondary | ICD-10-CM

## 2024-08-09 DIAGNOSIS — R319 Hematuria, unspecified: Secondary | ICD-10-CM

## 2024-08-09 DIAGNOSIS — Z87440 Personal history of urinary (tract) infections: Secondary | ICD-10-CM

## 2024-08-09 LAB — BLADDER SCAN AMB NON-IMAGING: Scan Result: 63

## 2024-08-09 NOTE — Progress Notes (Signed)
 Bladder Scan completed today due to reason of incomplete bladder emptying   Patient can void prior to the bladder scan. Bladder scan result: 63  Performed By: Exie DASEN. CMA  Additional notes- Patient is scheduled to follow up with MD

## 2024-08-10 LAB — URINALYSIS, ROUTINE W REFLEX MICROSCOPIC
Bilirubin, UA: NEGATIVE
Glucose, UA: NEGATIVE
Nitrite, UA: NEGATIVE
Protein,UA: NEGATIVE
RBC, UA: NEGATIVE
Specific Gravity, UA: 1.02 (ref 1.005–1.030)
Urobilinogen, Ur: 1 mg/dL (ref 0.2–1.0)
pH, UA: 5.5 (ref 5.0–7.5)

## 2024-08-10 LAB — MICROSCOPIC EXAMINATION: WBC, UA: >30 /HPF — AB (ref 0–5)

## 2024-08-13 LAB — URINE CULTURE

## 2024-08-14 ENCOUNTER — Ambulatory Visit: Payer: Self-pay | Admitting: Urology

## 2024-08-14 ENCOUNTER — Other Ambulatory Visit: Payer: Self-pay | Admitting: Urology

## 2024-08-14 DIAGNOSIS — N3001 Acute cystitis with hematuria: Secondary | ICD-10-CM

## 2024-08-14 MED ORDER — AMOXICILLIN 500 MG PO CAPS: 500.0000 mg | ORAL_CAPSULE | Freq: Three times a day (TID) | ORAL | 0 refills | Status: AC

## 2024-08-15 NOTE — Telephone Encounter (Signed)
 Called pt to let her know her urine culture results per MD Dahlstedt pt voiced her understanding and also stated she believes [facility] does not except her insurance but is waiting on a call back from them pt wanted know if there was somewhere else she could possibly go

## 2024-08-15 NOTE — Telephone Encounter (Signed)
-----   Message from Garnette HERO Dahlstedt sent at 08/14/2024  9:42 AM EST ----- Please let pt know that she had a + culture and I have sent an abx in ----- Message ----- From: Interface, [facility] Lab Results In Sent: 08/10/2024   5:38 AM EST To: Garnette Shack, MD

## 2024-08-17 ENCOUNTER — Other Ambulatory Visit: Payer: Self-pay | Admitting: Cardiology

## 2024-08-21 ENCOUNTER — Other Ambulatory Visit: Payer: Self-pay | Admitting: Cardiology

## 2024-08-23 NOTE — Progress Notes (Signed)
 Pt was seen at [facility] urology

## 2024-09-11 ENCOUNTER — Other Ambulatory Visit: Payer: Self-pay | Admitting: Nurse Practitioner

## 2024-09-27 ENCOUNTER — Other Ambulatory Visit: Payer: Self-pay | Admitting: Urology

## 2024-09-27 ENCOUNTER — Telehealth: Payer: Self-pay | Admitting: Cardiology

## 2024-09-27 NOTE — Telephone Encounter (Signed)
" ° °  Name: Kimberly Flowers  DOB: 1940/10/06  MRN: [id number]  Primary Cardiologist: Alvan Carrier, MD  Chart reviewed as part of pre-operative protocol coverage. Because of [name]'s past medical history and time since last visit, she will require a follow-up in-office visit in order to better assess preoperative cardiovascular risk.  She lives in [place] .  Unable to do televisit.  Pre-op covering staff: - Please schedule appointment and call patient to inform them. If patient already had an upcoming appointment within acceptable timeframe, please add pre-op clearance to the appointment notes so provider is aware. - Please contact requesting surgeon's office via preferred method (i.e, phone, fax) to inform them of need for appointment prior to surgery.  Aspirin  should be continued throughout the perioperative period.  Okay to hold Plavix  for 5 days prior to procedure and resume postop when felt safe to do so.   Lum LITTIE Louis, NP  09/27/2024, 3:39 PM     "

## 2024-09-27 NOTE — Telephone Encounter (Signed)
"  ° °  Pre-operative Risk Assessment    Patient Name: Kimberly Flowers  DOB: 02-16-1941 MRN: [id number]   Date of last office visit: 04/29/2024 Date of next office visit:    Request for Surgical Clearance    Procedure:  Robotic sacrocolpopexy  Date of Surgery:  Clearance 11/25/24                                Surgeon:  Dr. Morene Salines  Surgeon's Group or Practice Name:  [facility]  Phone number:  [phone] Fax number:  [phone]   Type of Clearance Requested:   - Medical  - Pharmacy:  Hold Aspirin  Plavix     Type of Anesthesia:  General    Additional requests/questions:    SignedKari Fuelling   09/27/2024, 3:25 PM   "

## 2024-09-27 NOTE — Telephone Encounter (Signed)
 Pt has been scheduled in office appt 10/14/24 @ 1:30 with Almarie Crate, NP in our [place] location. I will update all parties involved pt has appt for preop clearance.

## 2024-10-14 ENCOUNTER — Encounter: Payer: Self-pay | Admitting: Nurse Practitioner

## 2024-10-14 ENCOUNTER — Ambulatory Visit: Admitting: Nurse Practitioner

## 2024-10-14 VITALS — BP 122/68 | HR 82 | Ht 65.5 in | Wt 176.6 lb

## 2024-10-14 DIAGNOSIS — I10 Essential (primary) hypertension: Secondary | ICD-10-CM

## 2024-10-14 NOTE — Progress Notes (Unsigned)
 " Cardiology Office Note:  .   Date:  09/08/2023 ID:  Kimberly Flowers, DOB 1941/08/13, MRN [id number] PCP: Kimberly Leta NOVAK, MD  [facility] Providers Cardiologist:  Alvan Carrier, MD    History of Present Illness: .   Kimberly Flowers is a 84 y.o. female with PMH of CAD, s/p CABG, hx of chronic chest pain, hypertension, hyperlipidemia, and history of breast cancer, who presents today for preoperative cardiovascular risk assessment.  Previous CV history includes CABG in 2005, with subsequent occlusion of LIMA-LAD graft, history of receiving drug-eluting stent to LAD.  NST in 2018 low risk.  Underwent LHC in 2021-report noted below.  Had PCI to RCA CTO with Dr. Jordan in 2021.  Recommended for indefinite DAPT given her extensive stents.  PCI added to mid LAD in 2022.  Last seen by Dr. Carrier Alvan on December 30, 2022.  She reported chronic stable angina overall stable, though reported be bothersome.  GDMT limited due to allergy to Ranexa , had baseline bradycardia, therefore BB avoided.  Reported some dizziness on Imdur .  Losartan  was stopped to see if she could tolerate Imdur  60 mg in a.m., 30 mg in p.m.  She called back to the office and it was decided to continue Imdur  at 60 mg daily, return to losartan  at 50 mg daily.  03/05/2023 - Presents for preoperative cardiovascular risk assessment.  She is pending left middle finger ganglion cyst excision, surgery set for March 10, 2023 with Dr. Norleen Robins of [facility].  She presents today for medical clearance.  She is doing well today.  Continues to note chronic angina, stable per her report.  She describes it as intermittent, mid center chest pain, not severe per her report, occasionally takes an extra baby aspirin  and nitroglycerin  spray helps relieve the symptoms. Denies any shortness of breath, palpitations, syncope, presyncope, dizziness, orthopnea, PND, swelling or significant weight changes, acute bleeding, or  claudication.  09/08/2023 -she presents today for follow-up.  She continues to note chronic episodes of chest pain that have been stable since 2005 when she had her CABG surgery.  No specific pattern to her symptoms, episodes vary in length, no specific triggers.  Does take nitroglycerin  at times that helps.  Does have some palpitations that vary also, denies any long-lasting/bothersome palpitations.  Does admit to stress as her husband has dementia and also has caregiver. Denies any shortness of breath, syncope, presyncope, dizziness, orthopnea, PND, swelling or significant weight changes, acute bleeding, or claudication.      ROS: Negative. See HPI.   Studies Reviewed: SABRA    EKG: Offered, strongly encouraged an EKG to be performed, pt refused.   Echo 06/2021: 1. LV function low normal to mildly reduced (EF 50).   2. Left ventricular ejection fraction, by estimation, is 45 to 50%. The  left ventricle has mildly decreased function. The left ventricle  demonstrates global hypokinesis. Left ventricular diastolic parameters are consistent with Grade I diastolic dysfunction (impaired relaxation). Elevated left atrial pressure.   3. Right ventricular systolic function is normal. The right ventricular  size is normal. Tricuspid regurgitation signal is inadequate for assessing PA pressure.   4. The mitral valve is normal in structure. No evidence of mitral valve  regurgitation. No evidence of mitral stenosis.   5. The aortic valve is tricuspid. Aortic valve regurgitation is trivial.  Mild aortic valve sclerosis is present, with no evidence of aortic valve  stenosis.   6. The inferior vena cava is normal in  size with greater than 50%  respiratory variability, suggesting right atrial pressure of 3 mmHg.  LHC 10/18/2020, coronary stent intervention) Mid LAD lesion is 75% stenosed. Mid LAD to Dist LAD lesion is 35% stenosed. A drug-eluting stent was successfully placed using a STENT RESOLUTE ONYX  2.5X34. Post intervention, there is a 0% residual stenosis. Previously placed RPDA drug eluting stent is widely patent. Previously placed Mid RCA to Dist RCA drug eluting stent is widely patent. Previously placed Ost RCA to Mid RCA drug eluting stent is widely patent.   1. Widely patent RCA vessel from prior CTO PCI 2. Successful PCI of the proximal to mid LAD with OCT guidance and DES x 1 with 2.5 x 34 mm Resolute stent post dilated with 2.75 mm Milan balloon to 18 atm   Plan: will observe overnight tonight. Continue DAPT indefinitely. Anticipate DC in am.  Coronary stent intervention 08/2020: 3rd Diag-1 lesion is 90% stenosed. 3rd Diag-2 lesion is 70% stenosed. Mid LAD lesion is 65% stenosed. Mid LAD to Dist LAD lesion is 25% stenosed. 2nd Diag lesion is 45% stenosed. Mid RCA to Dist RCA lesion is 100% stenosed. A drug-eluting stent was successfully placed using a SYNERGY XD 3.50X38. Ost RCA to Mid RCA lesion is 80% stenosed. A drug-eluting stent was successfully placed using a SYNERGY XD 4.0X48. RPDA lesion is 90% stenosed. A drug-eluting stent was successfully placed using a SYNERGY XD 2.50X28. A drug-eluting stent was successfully placed using a SYNERGY XD 4.0X12. Post intervention, there is a 0% residual stenosis. Post intervention, there is a 0% residual stenosis. Post intervention, there is a 0% residual stenosis.   1. Successful CTO PCI of the RCA from the proximal vessel into the PDA with DES x 4 and IVUS guidance.   Plan: DAPT indefinitely given extensive nature of stents. We will see how she does clinically. If she has persistent anginal symptoms we could address the stenosis in the LAD at a later time.   LHC 07/2020: Severe two-vessel coronary artery disease involving the dominant right coronary and the previously stented mid to distal LAD which involves to moderate to large diagonals. Left main is widely patent LAD contains segmental 60 to 70% mid stenosis proximal to a  previously placed stent in the distal segment.  The stent jails a large and moderate sized second and third diagonals.  The third diagonal has proximal and mid significant stenoses.  LAD supplies collaterals to the right coronary via septal perforators and around the apex. Circumflex gives origin to 2 obtuse marginal branches which are free of any significant obstruction.  The proximal circumflex contains eccentric 20% stenosis. Ramus intermedius is large and widely patent Right coronary is dominant and totally occluded in the mid to distal segment and receives collaterals from right to right and left to right. Normal LV function.  LVEDP 8 mmHg.  EF 55%.   RECOMMENDATIONS:   We will discuss whether RCA is approachable by the CTO team. Would up titrate nitrate therapy to improve angina. If refractory symptoms, consider repeat coronary bypass to the second and third diagonals as well as the distal LAD.  Would also need to have LV branch of RCA and PDA grafted at the same time.  If only interventional approach is stenting of the mid LAD this would lead to a double layer of stent and will have limited long-term patency.   LHC 08/2015:  Prox RCA lesion, 20% stenosed. Ost Cx to Prox Cx lesion, 20% stenosed. 3rd Diag lesion, 90%  stenosed. Mid LAD to Dist LAD lesion, 20% stenosed. The lesion was previously treated with a stent (unknown type) greater than two years ago. Ost 3rd Diag to 3rd Diag lesion, 40% stenosed. Ost 2nd Diag lesion, 60% stenosed. Prox LAD to Mid LAD lesion, 40% stenosed. The left ventricular systolic function is normal. LIMA was not injected . Origin lesion, 100% stenosed.   1. Single vessel CAD with patent stent mid LAD with mild restenosis. 2. Severe disease in a small caliber diagonal branch. The ostium of this branch is jailed by the LAD stent. The vessel is 1.5 mm and too small for PCI.  3. Mild non-obstructive disease in the RCA and Circumflex artery.  4. Normal LV  systolic function 5. Known occlusion of the LIMA graft to the LAD (non injected)   Recommendations: Continue medical management of CAD.       Physical Exam:   VS:  There were no vitals taken for this visit.   Wt Readings from Last 3 Encounters:  04/29/24 175 lb (79.4 kg)  09/08/23 165 lb 6.4 oz (75 kg)  03/05/23 162 lb 9.6 oz (73.8 kg)    GEN: Well nourished, well developed in no acute distress NECK: No JVD; No carotid bruits CARDIAC: S1/S2, RRR, no murmurs, rubs, gallops RESPIRATORY:  Clear to auscultation without rales, wheezing or rhonchi  ABDOMEN: Soft, non-tender, non-distended EXTREMITIES: Nonpitting edema to bilateral lower extremities, (L>R); No deformity   ASSESSMENT AND PLAN: .    CAD, s/p CABG, chronic chest pain  History of longstanding chronic angina since 2005, stable per her report.  Medical therapy limited - reported allergy to Ranexa , has hx baseline bradycardia, therefore BB avoided.  Reported some dizziness on Imdur  in the past.  Tolerating current medication regimen well.  Offered/strongly recommended to perform an EKG, however patient declines as she states she must get home to help with her husband.  Caregiver called during appointment today saying patient's husband needed help at home. No medication changes at this time. Heart healthy diet and regular cardiovascular exercise encouraged.  ED precautions discussed.  HTN Blood pressure stable.  Continue current medication regimen. Discussed to monitor BP at home at least 2 hours after medications and sitting for 5-10 minutes. Heart healthy diet and regular cardiovascular exercise encouraged.   HLD Past LDL 63.  LDL goal less than 55.  Continue medication regimen at this time.  Discussed lifestyle modifications, including diet and exercise.  She verbalized understanding.  Not addressed, but at next office visit, plan to repeat FLP and LFT and if no improvement, plan to initiate Zetia  to medication regimen.  Chronic  venous insufficiency  Stable leg edema per her report.  Discussed low-salt, heart healthy diet and leg elevation.  Previously recommended wearing compression stockings. Heart healthy diet and regular cardiovascular exercise encouraged.    {Click Here to Calculate RCRI      :789639253}  { Click Here to Calculate DASI      :789639253} Ms. Rua's perioperative risk of a major cardiac event is 0.9% according to the Revised Cardiac Risk Index (RCRI).  Therefore, she is at low risk for perioperative complications.   Her functional capacity is {excellent/good/fair/poor:19665} at 6.05 METs according to the Duke Activity Status Index (DASI). Recommendations: {2014 ACC/AHA Perioperative Guidelines  :21036001} Antiplatelet and/or Anticoagulation Recommendations: {Antiplatelet Recommendations                  :21036016} {Anticoagulation Recommendations           :78963980}  Dispo: Follow-up with Dr. Dorn Ross or APP in 6 months or sooner anything changes.  Signed, Almarie Crate, NP   "

## 2024-10-14 NOTE — Patient Instructions (Signed)
 Medication Instructions:  Your physician recommends that you continue on your current medications as directed. Please refer to the Current Medication list given to you today.   Labwork: Requested labs from Primary Care Physician   Testing/Procedures: None  Follow-Up: Your physician recommends that you schedule a follow-up appointment in: 6 months  Any Other Special Instructions Will Be Listed Below (If Applicable). Thank you for choosing [facility]!     If you need a refill on your cardiac medications before your next appointment, please call your pharmacy.

## 2024-11-25 ENCOUNTER — Ambulatory Visit (HOSPITAL_COMMUNITY): Admit: 2024-11-25 | Admitting: Urology
